# Patient Record
Sex: Male | Born: 1970 | Race: White | Hispanic: No | Marital: Married | Smoking: Former smoker
Health system: Southern US, Community
[De-identification: ages and names within clinical notes are randomized; demographics above are authoritative.]

## PROBLEM LIST (undated history)

## (undated) DIAGNOSIS — N420 Calculus of prostate: Secondary | ICD-10-CM

## (undated) DIAGNOSIS — N419 Inflammatory disease of prostate, unspecified: Secondary | ICD-10-CM

## (undated) DIAGNOSIS — E663 Overweight: Secondary | ICD-10-CM

## (undated) DIAGNOSIS — E785 Hyperlipidemia, unspecified: Secondary | ICD-10-CM

## (undated) DIAGNOSIS — Z8619 Personal history of other infectious and parasitic diseases: Secondary | ICD-10-CM

## (undated) DIAGNOSIS — F419 Anxiety disorder, unspecified: Secondary | ICD-10-CM

## (undated) DIAGNOSIS — R030 Elevated blood-pressure reading, without diagnosis of hypertension: Secondary | ICD-10-CM

## (undated) DIAGNOSIS — R339 Retention of urine, unspecified: Secondary | ICD-10-CM

## (undated) HISTORY — DX: Anxiety disorder, unspecified: F41.9

## (undated) HISTORY — DX: Retention of urine, unspecified: R33.9

## (undated) HISTORY — DX: Inflammatory disease of prostate, unspecified: N41.9

## (undated) HISTORY — DX: Overweight: E66.3

## (undated) HISTORY — DX: Personal history of other infectious and parasitic diseases: Z86.19

## (undated) HISTORY — PX: HERNIA REPAIR: SHX51

## (undated) HISTORY — DX: Hyperlipidemia, unspecified: E78.5

## (undated) HISTORY — DX: Elevated blood-pressure reading, without diagnosis of hypertension: R03.0

## (undated) HISTORY — PX: VARICOCELE EXCISION: SUR582

## (undated) HISTORY — DX: Calculus of prostate: N42.0

---

## 2004-03-23 ENCOUNTER — Emergency Department: Payer: Self-pay | Admitting: Emergency Medicine

## 2006-07-29 ENCOUNTER — Ambulatory Visit: Payer: Self-pay | Admitting: Specialist

## 2009-08-08 ENCOUNTER — Ambulatory Visit: Payer: Self-pay | Admitting: Specialist

## 2011-06-30 ENCOUNTER — Ambulatory Visit: Payer: Self-pay

## 2012-01-13 ENCOUNTER — Ambulatory Visit: Payer: Self-pay | Admitting: Family Medicine

## 2012-11-24 ENCOUNTER — Ambulatory Visit: Payer: Self-pay

## 2012-11-24 LAB — RAPID STREP-A WITH REFLX: Micro Text Report: NEGATIVE

## 2012-11-27 LAB — BETA STREP CULTURE(ARMC)

## 2013-01-24 ENCOUNTER — Ambulatory Visit: Payer: Self-pay | Admitting: Family Medicine

## 2013-01-24 LAB — CBC WITH DIFFERENTIAL/PLATELET
Basophil #: 0 10*3/uL (ref 0.0–0.1)
Basophil %: 0.8 %
Eosinophil #: 0.1 10*3/uL (ref 0.0–0.7)
Eosinophil %: 2 %
HCT: 43 % (ref 40.0–52.0)
HGB: 15.3 g/dL (ref 13.0–18.0)
Lymphocyte #: 1.6 10*3/uL (ref 1.0–3.6)
Lymphocyte %: 34.5 %
MCH: 32.9 pg (ref 26.0–34.0)
MCHC: 35.5 g/dL (ref 32.0–36.0)
MCV: 93 fL (ref 80–100)
Monocyte #: 0.3 x10 3/mm (ref 0.2–1.0)
Monocyte %: 6.5 %
Neutrophil #: 2.6 10*3/uL (ref 1.4–6.5)
Neutrophil %: 56.2 %
Platelet: 169 10*3/uL (ref 150–440)
RBC: 4.64 10*6/uL (ref 4.40–5.90)
RDW: 12.6 % (ref 11.5–14.5)
WBC: 4.6 10*3/uL (ref 3.8–10.6)

## 2013-01-24 LAB — COMPREHENSIVE METABOLIC PANEL
Albumin: 3.7 g/dL (ref 3.4–5.0)
Alkaline Phosphatase: 65 U/L (ref 50–136)
Anion Gap: 10 (ref 7–16)
BUN: 14 mg/dL (ref 7–18)
Bilirubin,Total: 0.5 mg/dL (ref 0.2–1.0)
Calcium, Total: 8.6 mg/dL (ref 8.5–10.1)
Chloride: 104 mmol/L (ref 98–107)
Co2: 26 mmol/L (ref 21–32)
Creatinine: 1.51 mg/dL — ABNORMAL HIGH (ref 0.60–1.30)
EGFR (African American): >60
EGFR (Non-African Amer.): 57 — ABNORMAL LOW
Glucose: 137 mg/dL — ABNORMAL HIGH (ref 65–99)
Osmolality: 282 (ref 275–301)
Potassium: 3.8 mmol/L (ref 3.5–5.1)
SGOT(AST): 15 U/L (ref 15–37)
SGPT (ALT): 36 U/L (ref 12–78)
Sodium: 140 mmol/L (ref 136–145)
Total Protein: 6.9 g/dL (ref 6.4–8.2)

## 2014-11-07 ENCOUNTER — Other Ambulatory Visit: Payer: Self-pay | Admitting: Family Medicine

## 2014-11-07 DIAGNOSIS — F418 Other specified anxiety disorders: Secondary | ICD-10-CM

## 2014-11-07 MED ORDER — ALPRAZOLAM 0.25 MG PO TABS: 0.2500 mg | ORAL_TABLET | Freq: Two times a day (BID) | ORAL | Status: DC | PRN

## 2014-11-14 ENCOUNTER — Other Ambulatory Visit: Payer: Self-pay

## 2014-11-14 ENCOUNTER — Other Ambulatory Visit: Payer: Self-pay | Admitting: Family Medicine

## 2014-11-14 DIAGNOSIS — F419 Anxiety disorder, unspecified: Secondary | ICD-10-CM

## 2014-11-14 MED ORDER — ALPRAZOLAM 0.25 MG PO TABS: 0.2500 mg | ORAL_TABLET | Freq: Two times a day (BID) | ORAL | Status: DC | PRN

## 2014-11-19 ENCOUNTER — Other Ambulatory Visit: Payer: Self-pay

## 2014-11-19 DIAGNOSIS — F419 Anxiety disorder, unspecified: Secondary | ICD-10-CM

## 2014-12-05 ENCOUNTER — Encounter: Payer: Self-pay | Admitting: Family Medicine

## 2014-12-05 ENCOUNTER — Ambulatory Visit (INDEPENDENT_AMBULATORY_CARE_PROVIDER_SITE_OTHER): Payer: BLUE CROSS/BLUE SHIELD | Admitting: Family Medicine

## 2014-12-05 VITALS — BP 132/88 | HR 78 | Temp 98.3°F | Resp 18 | Ht 73.0 in | Wt 249.2 lb

## 2014-12-05 DIAGNOSIS — M1000 Idiopathic gout, unspecified site: Secondary | ICD-10-CM

## 2014-12-05 DIAGNOSIS — E669 Obesity, unspecified: Secondary | ICD-10-CM

## 2014-12-05 DIAGNOSIS — F419 Anxiety disorder, unspecified: Secondary | ICD-10-CM

## 2014-12-05 MED ORDER — ALPRAZOLAM 0.25 MG PO TABS: 0.2500 mg | ORAL_TABLET | Freq: Two times a day (BID) | ORAL | Status: DC | PRN

## 2014-12-05 NOTE — Patient Instructions (Signed)
Scheduling.  Recheck anxiety in 6 months

## 2014-12-05 NOTE — Progress Notes (Signed)
Name: Gilbert Buchanan   MRN: [id number]    DOB: [birth year]   Date:12/05/2014       Progress Note  Subjective  Chief Complaint  Chief Complaint  Patient presents with  . Anxiety  . Hyperlipidemia  . Nicotine Dependence    Anxiety Presents for follow-up visit. Symptoms include excessive worry, insomnia, irritability, muscle tension and nervous/anxious behavior. Patient reports no chest pain, dizziness, nausea, palpitations or shortness of breath. Symptoms occur most days. The severity of symptoms is moderate. The symptoms are aggravated by caffeine, work stress and family issues. The quality of sleep is fair.   Past treatments include benzodiazephines and non-SSRI antidepressants. The treatment provided moderate relief. Compliance with prior treatments has been good.  Hyperlipidemia This is a chronic problem. The current episode started more than 1 year ago. The problem is controlled. Recent lipid tests were reviewed and are high. Exacerbating diseases include obesity. Factors aggravating his hyperlipidemia include fatty foods. Pertinent negatives include no chest pain, focal weakness, myalgias or shortness of breath. Treatments tried: Vascepa. The current treatment provides mild improvement of lipids. Compliance problems include adherence to diet and adherence to exercise.  Risk factors for coronary artery disease include a sedentary lifestyle, obesity, male sex, dyslipidemia and stress.  Nicotine Dependence Presents for follow-up visit. Symptoms include insomnia and irritability. Symptoms are negative for sore throat. His urge triggers include company of smokers. The symptoms have been worsening. Compliance with prior treatments has been poor.   Gout  Patient has had no recent flare of gout. He has his prescription for Naprosyn but has not had to use it any time recently.   Past Medical History  Diagnosis Date  . Hyperlipidemia   . Anxiety   . Elevated blood pressure (not hypertension)      History  Substance Use Topics  . Smoking status: Former Research scientist (life sciences)  . Smokeless tobacco: Not on file  . Alcohol Use: 0.0 oz/week    0 Standard drinks or equivalent per week     Current outpatient prescriptions:  .  ALPRAZolam (XANAX) 0.25 MG tablet, Take 1 tablet (0.25 mg total) by mouth every 12 (twelve) hours as needed for anxiety., Disp: 60 tablet, Rfl: 2 .  buPROPion (WELLBUTRIN XL) 150 MG 24 hr tablet, Take 150 mg by mouth daily., Disp: , Rfl: 5 .  tamsulosin (FLOMAX) 0.4 MG CAPS capsule, Take 1 capsule by mouth daily., Disp: , Rfl: 5 .  VASCEPA 1 G CAPS, Take 2 capsules by mouth 2 (two) times daily., Disp: , Rfl: 5 .  venlafaxine (EFFEXOR) 37.5 MG tablet, Take 1 tablet by mouth 2 (two) times daily., Disp: , Rfl: 5  No Known Allergies  Review of Systems  Constitutional: Positive for irritability. Negative for fever, chills and weight loss.  HENT: Negative for congestion, hearing loss, sore throat and tinnitus.   Eyes: Negative for blurred vision, double vision and redness.  Respiratory: Negative for cough, hemoptysis and shortness of breath.   Cardiovascular: Negative for chest pain, palpitations, orthopnea, claudication and leg swelling.  Gastrointestinal: Negative for heartburn, nausea, vomiting, diarrhea, constipation and blood in stool.  Genitourinary: Negative for dysuria, urgency, frequency and hematuria.  Musculoskeletal: Negative for myalgias, back pain, joint pain, falls and neck pain.  Skin: Negative for itching.  Neurological: Negative for dizziness, tingling, tremors, focal weakness, seizures, loss of consciousness, weakness and headaches.  Endo/Heme/Allergies: Does not bruise/bleed easily.  Psychiatric/Behavioral: Negative for depression and substance abuse. The patient is nervous/anxious and has insomnia.  Objective  Filed Vitals:   12/05/14 1154  BP: 132/88  Pulse: 78  Temp: 98.3 F (36.8 C)  Resp: 18  Height: 6\' 1"  (1.854 m)  Weight: 249 lb 4 oz  (113.059 kg)  SpO2: 97%     Physical Exam  Constitutional: He is oriented to person, place, and time and well-developed, well-nourished, and in no distress.  Obese  HENT:  Head: Normocephalic.  Bilateral nasal turbinate swelling and erythema with clear rhinorrhea  Eyes: EOM are normal. Pupils are equal, round, and reactive to light.  Neck: Normal range of motion. Neck supple. No thyromegaly present.  Cardiovascular: Normal rate, regular rhythm and normal heart sounds.   No murmur heard. Pulmonary/Chest: Effort normal and breath sounds normal. No respiratory distress. He has no wheezes.  Abdominal: Soft. Bowel sounds are normal.  Musculoskeletal: Normal range of motion. He exhibits no edema.  Lymphadenopathy:    He has no cervical adenopathy.  Neurological: He is alert and oriented to person, place, and time. No cranial nerve deficit. Gait normal. Coordination normal.  Skin: Skin is warm and dry. No rash noted.  Psychiatric: Affect and judgment normal.  Anxious      Assessment & Plan  1. Acute anxiety Controlled on current regimen - ALPRAZolam (XANAX) 0.25 MG tablet; Take 1 tablet (0.25 mg total) by mouth every 12 (twelve) hours as needed for anxiety.  Dispense: 180 tablet; Refill: 1  2. Idiopathic gout, unspecified chronicity, unspecified site No recent flare  3. Obesity Diet and exercise encouraged

## 2014-12-12 ENCOUNTER — Telehealth: Payer: Self-pay | Admitting: Family Medicine

## 2014-12-12 NOTE — Telephone Encounter (Signed)
PT HAD ALREADY CHAGED PHARM LOCATION IN SYSTEM PER LATISHA

## 2014-12-12 NOTE — Telephone Encounter (Signed)
Pt said that we need to update the pharm in his history. [facility] at [facility] . THIS IS A MAIL ORDER SERVICE. AD[address]ESS IS [address] [place] [address]

## 2014-12-21 ENCOUNTER — Other Ambulatory Visit: Payer: Self-pay

## 2014-12-21 DIAGNOSIS — F419 Anxiety disorder, unspecified: Secondary | ICD-10-CM

## 2014-12-21 MED ORDER — ALPRAZOLAM 0.25 MG PO TABS: 0.2500 mg | ORAL_TABLET | Freq: Two times a day (BID) | ORAL | Status: DC | PRN

## 2015-01-14 ENCOUNTER — Telehealth: Payer: Self-pay | Admitting: Family Medicine

## 2015-01-14 NOTE — Telephone Encounter (Signed)
Pt needs refill on Welbutrin and his cholesterol medication. [facility] is his new pharmacy.

## 2015-01-15 MED ORDER — VASCEPA 1 G PO CAPS: 2.0000 | ORAL_CAPSULE | Freq: Two times a day (BID) | ORAL | Status: DC

## 2015-01-15 MED ORDER — BUPROPION HCL ER (XL) 150 MG PO TB24: 150.0000 mg | ORAL_TABLET | Freq: Every day | ORAL | Status: DC

## 2015-01-15 NOTE — Telephone Encounter (Signed)
Sent refills to pt pharmacy.

## 2015-01-15 NOTE — Telephone Encounter (Signed)
LMOM to inform pt that refills sent to pharmacy.

## 2015-01-16 ENCOUNTER — Telehealth: Payer: Self-pay | Admitting: Family Medicine

## 2015-01-16 MED ORDER — BUPROPION HCL ER (XL) 150 MG PO TB24: 150.0000 mg | ORAL_TABLET | Freq: Every day | ORAL | Status: DC

## 2015-01-16 MED ORDER — VASCEPA 1 G PO CAPS: 2.0000 | ORAL_CAPSULE | Freq: Two times a day (BID) | ORAL | Status: DC

## 2015-01-16 NOTE — Telephone Encounter (Signed)
Changed both to 90 day supply and sent to pharmacy.

## 2015-01-16 NOTE — Telephone Encounter (Signed)
PHARM IS ASKING IF YOU COULD CALL AND OK A 90 DAY SUPPLY ON WELLBUTRIN AND VASCEPA.

## 2015-01-29 ENCOUNTER — Ambulatory Visit (INDEPENDENT_AMBULATORY_CARE_PROVIDER_SITE_OTHER): Payer: BLUE CROSS/BLUE SHIELD | Admitting: Family Medicine

## 2015-01-29 ENCOUNTER — Encounter: Payer: Self-pay | Admitting: Family Medicine

## 2015-01-29 VITALS — BP 120/70 | HR 81 | Temp 98.6°F | Resp 18 | Ht 73.0 in | Wt 256.7 lb

## 2015-01-29 DIAGNOSIS — Z0000 Encounter for general adult medical examination without abnormal findings: Secondary | ICD-10-CM | POA: Insufficient documentation

## 2015-01-29 DIAGNOSIS — G4730 Sleep apnea, unspecified: Secondary | ICD-10-CM | POA: Insufficient documentation

## 2015-01-29 NOTE — Progress Notes (Signed)
Name: Gilbert Buchanan   MRN: [id number]    DOB: March 19, 1971   Date:01/29/2015       Progress Note  Subjective  Chief Complaint  Chief Complaint  Patient presents with  . Annual Exam    HPI  44 year old presenting for annual H&P. Baseline problems have been stable.  Past Medical History  Diagnosis Date  . Hyperlipidemia   . Anxiety   . Elevated blood pressure (not hypertension)     Social History  Substance Use Topics  . Smoking status: Former Research scientist (life sciences)  . Smokeless tobacco: Not on file  . Alcohol Use: 0.0 oz/week    0 Standard drinks or equivalent per week     Current outpatient prescriptions:  .  ALPRAZolam (XANAX) 0.25 MG tablet, Take 1 tablet (0.25 mg total) by mouth every 12 (twelve) hours as needed for anxiety., Disp: 180 tablet, Rfl: 1 .  buPROPion (WELLBUTRIN XL) 150 MG 24 hr tablet, Take 1 tablet (150 mg total) by mouth daily., Disp: 90 tablet, Rfl: 1 .  tamsulosin (FLOMAX) 0.4 MG CAPS capsule, Take 1 capsule by mouth daily., Disp: , Rfl: 5 .  VASCEPA 1 G CAPS, Take 2 capsules by mouth 2 (two) times daily., Disp: 180 capsule, Rfl: 1  Allergies  Allergen Reactions  . Acyclovir And Related     Review of Systems  Constitutional: Negative for fever, chills and weight loss.  HENT: Negative for congestion, hearing loss, sore throat and tinnitus.   Eyes: Negative for blurred vision, double vision and redness.  Respiratory: Negative for cough, hemoptysis and shortness of breath.   Cardiovascular: Negative for chest pain, palpitations, orthopnea, claudication and leg swelling.  Gastrointestinal: Negative for heartburn, nausea, vomiting, diarrhea, constipation and blood in stool.  Genitourinary: Negative for dysuria, urgency, frequency and hematuria.  Musculoskeletal: Negative for myalgias, back pain, joint pain, falls and neck pain.  Skin: Negative for itching.  Neurological: Negative for dizziness, tingling, tremors, focal weakness, seizures, loss of consciousness,  weakness and headaches.  Endo/Heme/Allergies: Does not bruise/bleed easily.  Psychiatric/Behavioral: Negative for depression and substance abuse. The patient is not nervous/anxious and does not have insomnia.      Objective  Filed Vitals:   01/29/15 1511  BP: 120/70  Pulse: 81  Temp: 98.6 F (37 C)  TempSrc: Oral  Resp: 18  Height: 6\' 1"  (1.854 m)  Weight: 256 lb 11.2 oz (116.438 kg)  SpO2: 97%     Physical Exam  Constitutional: He is oriented to person, place, and time and well-developed, well-nourished, and in no distress.  Obese and in no acute distress  HENT:  Head: Normocephalic.  Eyes: EOM are normal. Pupils are equal, round, and reactive to light.  Neck: Normal range of motion. Neck supple. No thyromegaly present.  Cardiovascular: Normal rate, regular rhythm and normal heart sounds.   No murmur heard. Pulmonary/Chest: Effort normal and breath sounds normal. No respiratory distress. He has no wheezes.  Abdominal: Soft. Bowel sounds are normal.  Musculoskeletal: Normal range of motion. He exhibits no edema or tenderness.  Lymphadenopathy:    He has no cervical adenopathy.  Neurological: He is alert and oriented to person, place, and time. No cranial nerve deficit. Gait normal. Coordination normal.  Skin: Skin is warm and dry. No rash noted.  Psychiatric: Affect and judgment normal.      Assessment & Plan  1. Annual physical exam  - CBC with Differential/Platelet - Comprehensive metabolic panel - Lipid panel - POC Hemoccult Bld/Stl (3-Cd Home Screen); Future -  TSH

## 2015-01-29 NOTE — Patient Instructions (Signed)
Sleep Apnea  Sleep apnea is a sleep disorder characterized by abnormal pauses in breathing while you sleep. When your breathing pauses, the level of oxygen in your blood decreases. This causes you to move out of deep sleep and into light sleep. As a result, your quality of sleep is poor, and the system that carries your blood throughout your body (cardiovascular system) experiences stress. If sleep apnea remains untreated, the following conditions can develop:  High blood pressure (hypertension).  Coronary artery disease.  Inability to achieve or maintain an erection (impotence).  Impairment of your thought process (cognitive dysfunction). There are three types of sleep apnea: 1. Obstructive sleep apnea--Pauses in breathing during sleep because of a blocked airway. 2. Central sleep apnea--Pauses in breathing during sleep because the area of the brain that controls your breathing does not send the correct signals to the muscles that control breathing. 3. Mixed sleep apnea--A combination of both obstructive and central sleep apnea. RISK FACTORS The following risk factors can increase your risk of developing sleep apnea:  Being overweight.  Smoking.  Having narrow passages in your nose and throat.  Being of older age.  Being male.  Alcohol use.  Sedative and tranquilizer use.  Ethnicity. Among individuals younger than 35 years, African Americans are at increased risk of sleep apnea. SYMPTOMS   Difficulty staying asleep.  Daytime sleepiness and fatigue.  Loss of energy.  Irritability.  Loud, heavy snoring.  Morning headaches.  Trouble concentrating.  Forgetfulness.  Decreased interest in sex. DIAGNOSIS  In order to diagnose sleep apnea, your caregiver will perform a physical examination. Your caregiver may suggest that you take a home sleep test. Your caregiver may also recommend that you spend the night in a sleep lab. In the sleep lab, several monitors record  information about your heart, lungs, and brain while you sleep. Your leg and arm movements and blood oxygen level are also recorded. TREATMENT The following actions may help to resolve mild sleep apnea:  Sleeping on your side.   Using a decongestant if you have nasal congestion.   Avoiding the use of depressants, including alcohol, sedatives, and narcotics.   Losing weight and modifying your diet if you are overweight. There also are devices and treatments to help open your airway:  Oral appliances. These are custom-made mouthpieces that shift your lower jaw forward and slightly open your bite. This opens your airway.  Devices that create positive airway pressure. This positive pressure "splints" your airway open to help you breathe better during sleep. The following devices create positive airway pressure:  Continuous positive airway pressure (CPAP) device. The CPAP device creates a continuous level of air pressure with an air pump. The air is delivered to your airway through a mask while you sleep. This continuous pressure keeps your airway open.  Nasal expiratory positive airway pressure (EPAP) device. The EPAP device creates positive air pressure as you exhale. The device consists of single-use valves, which are inserted into each nostril and held in place by adhesive. The valves create very little resistance when you inhale but create much more resistance when you exhale. That increased resistance creates the positive airway pressure. This positive pressure while you exhale keeps your airway open, making it easier to breath when you inhale again.  Bilevel positive airway pressure (BPAP) device. The BPAP device is used mainly in patients with central sleep apnea. This device is similar to the CPAP device because it also uses an air pump to deliver continuous air pressure   through a mask. However, with the BPAP machine, the pressure is set at two different levels. The pressure when you  exhale is lower than the pressure when you inhale.  Surgery. Typically, surgery is only done if you cannot comply with less invasive treatments or if the less invasive treatments do not improve your condition. Surgery involves removing excess tissue in your airway to create a wider passage way. Document Released: 05/15/2002 Document Revised: 09/19/2012 Document Reviewed: 10/01/2011 [facility] Patient Information 2015 [place], [place]. This information is not intended to replace advice given to you by your health care provider. Make sure you discuss any questions you have with your health care provider. Obesity Obesity is defined as having too much total body fat and a body mass index (BMI) of 30 or more. BMI is an estimate of body fat and is calculated from your height and weight. Obesity happens when you consume more calories than you can burn by exercising or performing daily physical tasks. Prolonged obesity can cause major illnesses or emergencies, such as:   Stroke.  Heart disease.  Diabetes.  Cancer.  Arthritis.  High blood pressure (hypertension).  High cholesterol.  Sleep apnea.  Erectile dysfunction.  Infertility problems. CAUSES  4. Regularly eating unhealthy foods. 5. Physical inactivity. 6. Certain disorders, such as an underactive thyroid (hypothyroidism), Cushing's syndrome, and polycystic ovarian syndrome. 7. Certain medicines, such as steroids, some depression medicines, and antipsychotics. 8. Genetics. 9. Lack of sleep. DIAGNOSIS  A health care provider can diagnose obesity after calculating your BMI. Obesity will be diagnosed if your BMI is 30 or higher.  There are other methods of measuring obesity levels. Some other methods include measuring your skinfold thickness, your waist circumference, and comparing your hip circumference to your waist circumference. TREATMENT  A healthy treatment program includes some or all of the following:  Long-term dietary  changes.  Exercise and physical activity.  Behavioral and lifestyle changes.  Medicine only under the supervision of your health care provider. Medicines may help, but only if they are used with diet and exercise programs. An unhealthy treatment program includes:  Fasting.  Fad diets.  Supplements and drugs. These choices do not succeed in long-term weight control.  HOME CARE INSTRUCTIONS   Exercise and perform physical activity as directed by your health care provider. To increase physical activity, try the following:  Use stairs instead of elevators.  Park farther away from store entrances.  Garden, bike, or walk instead of watching television or using the computer.  Eat healthy, low-calorie foods and drinks on a regular basis. Eat more fruits and vegetables. Use low-calorie cookbooks or take healthy cooking classes.  Limit fast food, sweets, and processed snack foods.  Eat smaller portions.  Keep a daily journal of everything you eat. There are many free websites to help you with this. It may be helpful to measure your foods so you can determine if you are eating the correct portion sizes.  Avoid drinking alcohol. Drink more water and drinks without calories.  Take vitamins and supplements only as recommended by your health care provider.  Weight-loss support groups, Tax adviser, counselors, and stress reduction education can also be very helpful. SEEK IMMEDIATE MEDICAL CARE IF:  You have chest pain or tightness.  You have trouble breathing or feel short of breath.  You have weakness or leg numbness.  You feel confused or have trouble talking.  You have sudden changes in your vision. MAKE SURE YOU:  Understand these instructions.  Will watch  your condition.  Will get help right away if you are not doing well or get worse. Document Released: 07/02/2004 Document Revised: 10/09/2013 Document Reviewed: 07/01/2011 [facility] Patient Information 2015  [place], [place]. This information is not intended to replace advice given to you by your health care provider. Make sure you discuss any questions you have with your health care provider.

## 2015-03-26 ENCOUNTER — Telehealth: Payer: Self-pay | Admitting: Family Medicine

## 2015-03-26 NOTE — Telephone Encounter (Signed)
Yes pt needs appointment for any health screening forms

## 2015-03-26 NOTE — Telephone Encounter (Signed)
We will remind the patient again when he comes in.

## 2015-03-26 NOTE — Telephone Encounter (Signed)
Per doctor Rutherford Nail pt must make appointment for health screening. He requests this of all patients with this paperwork.

## 2015-03-26 NOTE — Telephone Encounter (Signed)
Pt would like to pick up his lab order in the morning to go get his labs done. He received a lab order over a month ago for his CPE and never got it done. Also he has a health screening form for his insurance that needs to be filled out. Does he need an appt for this?

## 2015-03-26 NOTE — Telephone Encounter (Signed)
I called pt to inform that he needs to schedule an appt to get paperwork filled out, pt states he is not scheduling an appt and pay again since he had a CPE not to long ago. Pt states he will drop the form off for Dr Rutherford Nail to fill out.

## 2015-03-30 LAB — COMPREHENSIVE METABOLIC PANEL
ALT: 42 IU/L (ref 0–44)
AST: 23 IU/L (ref 0–40)
Albumin/Globulin Ratio: 2.3 (ref 1.1–2.5)
Albumin: 4.6 g/dL (ref 3.5–5.5)
Alkaline Phosphatase: 54 IU/L (ref 39–117)
BUN/Creatinine Ratio: 14 (ref 9–20)
BUN: 15 mg/dL (ref 6–24)
Bilirubin Total: 0.4 mg/dL (ref 0.0–1.2)
CO2: 24 mmol/L (ref 18–29)
Calcium: 9.5 mg/dL (ref 8.7–10.2)
Chloride: 101 mmol/L (ref 97–106)
Creatinine, Ser: 1.09 mg/dL (ref 0.76–1.27)
GFR calc Af Amer: 96 mL/min/{1.73_m2} (ref >59)
GFR calc non Af Amer: 83 mL/min/{1.73_m2} (ref >59)
Globulin, Total: 2 g/dL (ref 1.5–4.5)
Glucose: 110 mg/dL — ABNORMAL HIGH (ref 65–99)
Potassium: 4.4 mmol/L (ref 3.5–5.2)
Sodium: 143 mmol/L (ref 136–144)
Total Protein: 6.6 g/dL (ref 6.0–8.5)

## 2015-03-30 LAB — CBC WITH DIFFERENTIAL/PLATELET
Basophils Absolute: 0 10*3/uL (ref 0.0–0.2)
Basos: 0 %
EOS (ABSOLUTE): 0.1 10*3/uL (ref 0.0–0.4)
Eos: 2 %
Hematocrit: 46.9 % (ref 37.5–51.0)
Hemoglobin: 16.3 g/dL (ref 12.6–17.7)
Immature Grans (Abs): 0 10*3/uL (ref 0.0–0.1)
Immature Granulocytes: 0 %
Lymphocytes Absolute: 1.7 10*3/uL (ref 0.7–3.1)
Lymphs: 31 %
MCH: 32.7 pg (ref 26.6–33.0)
MCHC: 34.8 g/dL (ref 31.5–35.7)
MCV: 94 fL (ref 79–97)
Monocytes Absolute: 0.4 10*3/uL (ref 0.1–0.9)
Monocytes: 8 %
Neutrophils Absolute: 3.2 10*3/uL (ref 1.4–7.0)
Neutrophils: 59 %
Platelets: 211 10*3/uL (ref 150–379)
RBC: 4.99 x10E6/uL (ref 4.14–5.80)
RDW: 12.6 % (ref 12.3–15.4)
WBC: 5.5 10*3/uL (ref 3.4–10.8)

## 2015-03-30 LAB — TSH: TSH: 1.04 u[IU]/mL (ref 0.450–4.500)

## 2015-03-30 LAB — LIPID PANEL
Chol/HDL Ratio: 8.1 ratio units — ABNORMAL HIGH (ref 0.0–5.0)
Cholesterol, Total: 203 mg/dL — ABNORMAL HIGH (ref 100–199)
HDL: 25 mg/dL — ABNORMAL LOW (ref >39)
Triglycerides: 618 mg/dL (ref 0–149)

## 2015-04-08 ENCOUNTER — Telehealth: Payer: Self-pay | Admitting: Emergency Medicine

## 2015-04-08 NOTE — Telephone Encounter (Signed)
Patient notified

## 2015-04-24 ENCOUNTER — Telehealth: Payer: Self-pay | Admitting: Urology

## 2015-04-24 DIAGNOSIS — N400 Enlarged prostate without lower urinary tract symptoms: Secondary | ICD-10-CM

## 2015-04-24 NOTE — Telephone Encounter (Signed)
Pt's is out of Tamsilosin and needs refill called in.  Has an appt next week on 11/22 but needs a refill called in today.  Please call in to [place] at [phone].  Please call patient if any questions at [phone].

## 2015-04-25 ENCOUNTER — Encounter: Payer: Self-pay | Admitting: *Deleted

## 2015-04-25 MED ORDER — TAMSULOSIN HCL 0.4 MG PO CAPS: 0.4000 mg | ORAL_CAPSULE | Freq: Every day | ORAL | Status: DC

## 2015-04-25 NOTE — Telephone Encounter (Signed)
Medication called into pt pharmacy  

## 2015-04-30 ENCOUNTER — Ambulatory Visit (INDEPENDENT_AMBULATORY_CARE_PROVIDER_SITE_OTHER): Payer: BLUE CROSS/BLUE SHIELD | Admitting: Urology

## 2015-04-30 ENCOUNTER — Encounter: Payer: Self-pay | Admitting: Family Medicine

## 2015-04-30 ENCOUNTER — Ambulatory Visit (INDEPENDENT_AMBULATORY_CARE_PROVIDER_SITE_OTHER): Payer: BLUE CROSS/BLUE SHIELD | Admitting: Family Medicine

## 2015-04-30 ENCOUNTER — Encounter: Payer: Self-pay | Admitting: Urology

## 2015-04-30 VITALS — BP 106/69 | HR 67 | Ht 74.0 in | Wt 243.6 lb

## 2015-04-30 VITALS — BP 120/70 | HR 87 | Temp 98.1°F | Resp 18 | Ht 73.0 in | Wt 241.0 lb

## 2015-04-30 DIAGNOSIS — N419 Inflammatory disease of prostate, unspecified: Secondary | ICD-10-CM

## 2015-04-30 DIAGNOSIS — E785 Hyperlipidemia, unspecified: Secondary | ICD-10-CM | POA: Diagnosis not present

## 2015-04-30 DIAGNOSIS — N401 Enlarged prostate with lower urinary tract symptoms: Secondary | ICD-10-CM | POA: Diagnosis not present

## 2015-04-30 DIAGNOSIS — F411 Generalized anxiety disorder: Secondary | ICD-10-CM | POA: Diagnosis not present

## 2015-04-30 DIAGNOSIS — F419 Anxiety disorder, unspecified: Secondary | ICD-10-CM

## 2015-04-30 DIAGNOSIS — N138 Other obstructive and reflux uropathy: Secondary | ICD-10-CM

## 2015-04-30 MED ORDER — ALPRAZOLAM 0.25 MG PO TABS: 0.2500 mg | ORAL_TABLET | Freq: Two times a day (BID) | ORAL | Status: DC | PRN

## 2015-04-30 MED ORDER — TAMSULOSIN HCL 0.4 MG PO CAPS: 0.4000 mg | ORAL_CAPSULE | Freq: Every day | ORAL | Status: DC

## 2015-04-30 NOTE — Progress Notes (Signed)
04/30/2015 7:45 PM   Gilbert Buchanan [birth year] [id number]  Referring provider: Ashok Norris, MD [address] [place], [place] [address]  Chief Complaint  Patient presents with  . Prostatitis    one year recheck    HPI: Patient is a 44 year old white male with a history of prostatitis and BPH with LUTS who presents today for yearly follow-up.   History of prostatitis Patient has not had an episode of prostatitis since starting his tamsulosin 0.4 mg one year ago.  BPH WITH LUTS His IPSS score today is 3, which is mild lower urinary tract symptomatology. He is pleased with his quality life due to his urinary symptoms.  He denies any dysuria, hematuria or suprapubic pain.  He currently taking tamsulosin 0.4 mg daily.  He also denies any recent fevers, chills, nausea or vomiting.  He does not have a family history of PCa.      IPSS      04/30/15 1400       International Prostate Symptom Score   How often have you had the sensation of not emptying your bladder? Not at All     How often have you had to urinate less than every two hours? About half the time     How often have you found you stopped and started again several times when you urinated? Not at All     How often have you found it difficult to postpone urination? Not at All     How often have you had a weak urinary stream? Not at All     How often have you had to strain to start urination? Not at All     How many times did you typically get up at night to urinate? None     Total IPSS Score 3     Quality of Life due to urinary symptoms   If you were to spend the rest of your life with your urinary condition just the way it is now how would you feel about that? Pleased        Score:  1-7 Mild 8-19 Moderate 20-35 Severe     PMH: Past Medical History  Diagnosis Date  . Hyperlipidemia   . Anxiety   . Elevated blood pressure (not hypertension)   . History of Lyme disease   . Incomplete bladder  emptying   . Prostatitis   . Over weight   . Stones, prostate     Surgical History: Past Surgical History  Procedure Laterality Date  . Hernia repair    . Varicocele excision      Home Medications:    Medication List       This list is accurate as of: 04/30/15 11:59 PM.  Always use your most recent med list.               ALPRAZolam 0.25 MG tablet  Commonly known as:  XANAX  Take 1 tablet (0.25 mg total) by mouth every 12 (twelve) hours as needed for anxiety.     buPROPion 150 MG 24 hr tablet  Commonly known as:  WELLBUTRIN XL  Take 1 tablet (150 mg total) by mouth daily.     tamsulosin 0.4 MG Caps capsule  Commonly known as:  FLOMAX  Take 1 capsule (0.4 mg total) by mouth daily.     VASCEPA 1 G Caps  Generic drug:  Icosapent Ethyl  Take 2 capsules by mouth 2 (two) times daily.  Allergies:  Allergies  Allergen Reactions  . Acyclovir And Related     Family History: Family History  Problem Relation Age of Onset  . Testicular cancer Father   . Polycystic kidney disease    . Kidney disease Mother   . Prostate cancer Neg Hx     Social History:  reports that he has quit smoking. His smoking use included Cigars. He does not have any smokeless tobacco history on file. He reports that he drinks alcohol. He reports that he does not use illicit drugs.  ROS: UROLOGY Frequent Urination?: No Hard to postpone urination?: No Burning/pain with urination?: No Get up at night to urinate?: No Leakage of urine?: No Urine stream starts and stops?: No Trouble starting stream?: No Do you have to strain to urinate?: No Blood in urine?: No Urinary tract infection?: No Sexually transmitted disease?: No Injury to kidneys or bladder?: No Painful intercourse?: No Weak stream?: No Erection problems?: No Penile pain?: No  Gastrointestinal Nausea?: No Vomiting?: No Indigestion/heartburn?: No Diarrhea?: No Constipation?: No  Constitutional Fever: No Night  sweats?: No Weight loss?: No Fatigue?: No  Skin Skin rash/lesions?: No Itching?: No  Eyes Blurred vision?: No Double vision?: No  Ears/Nose/Throat Sore throat?: No Sinus problems?: No  Hematologic/Lymphatic Swollen glands?: No Easy bruising?: No  Cardiovascular Leg swelling?: No Chest pain?: No  Respiratory Cough?: No Shortness of breath?: No  Endocrine Excessive thirst?: No  Musculoskeletal Back pain?: No Joint pain?: No  Neurological Headaches?: No Dizziness?: No  Psychologic Depression?: No Anxiety?: No  Physical Exam: BP 106/69 mmHg  Pulse 67  Ht 6\' 2"  (1.88 m)  Wt 243 lb 9.6 oz (110.496 kg)  BMI 31.26 kg/m2  GU: No CVA tenderness.  No bladder fullness or masses.  Patient with circumcised phallus.  Urethral meatus is patent.  No penile discharge. No penile lesions or rashes. Scrotum without lesions, cysts, rashes and/or edema.  Testicles are located scrotally bilaterally. No masses are appreciated in the testicles. Left and right epididymis are normal. Rectal: Patient with  normal sphincter tone. Anus and perineum without scarring or rashes. No rectal masses are appreciated. Prostate is approximately 50 grams, no nodules are appreciated. Seminal vesicles are normal.   Laboratory Data: Lab Results  Component Value Date   WBC 5.5 03/29/2015   HGB 15.3 01/24/2013   HCT 46.9 03/29/2015   MCV 93 01/24/2013   PLT 169 01/24/2013    Lab Results  Component Value Date   CREATININE 1.09 03/29/2015   PSA History  1.0 ng/mL on 08/25/2011  1.1 ng/mL on 08/09/2013  Assessment & Plan:    1. Prostatitis, unspecified prostatitis type:   Patient has not had any episodes since he started tamsulosin 0.4 mg daily. He will continue that medication.  Refills are sent to his pharmacy.  2. BPH with LUTS:   IPSS score is 3/1.  He will continue the tamsulosin 0.4 mg daily.  We will continue to monitor.  He will return to the clinic in one year for IPSS score, exam  and PSA.    - PSA   Return in about 1 year (around 04/29/2016) for IPSS and exam.  Zara Council, [facility]  [facility] [address], [place], [place] [address] [phone]

## 2015-04-30 NOTE — Progress Notes (Signed)
Name: Gilbert Buchanan   MRN: [id number]    DOB: [birth year]   Date:04/30/2015       Progress Note  Subjective  Chief Complaint  Chief Complaint  Patient presents with  . Hyperlipidemia    3 month recheck  . Anxiety    HPI  Hyperlipidemia  Patient has a history of hyperlipidemia for several years.  Current medical regimen consist of placebo 1 g daily .  Compliance is certainly needs a lipid good .  Diet and exercise are currently followed intermittently .  Risk factors for cardiovascular disease include hyperlipidemia relatively sedentary lifestyle .   There have been no side effects from the medication.    Chronic anxiety history of present illness  Patient has a long-standing history of chronic anxiety for which she currently takes Xanax 0.25 mg twice a day and Wellbutrin 150 mg daily. He describes his symptomatology is consistent racing heart racing thoughts insomnia sweaty palms. There is no concomitant drug abuse though he does consume alcohol on a when necessary basis. This regimen has been working fine for him.  Past Medical History  Diagnosis Date  . Hyperlipidemia   . Anxiety   . Elevated blood pressure (not hypertension)   . History of Lyme disease   . Incomplete bladder emptying   . Prostatitis   . Over weight   . Stones, prostate     Social History  Substance Use Topics  . Smoking status: Former Research scientist (life sciences)  . Smokeless tobacco: Not on file  . Alcohol Use: 0.0 oz/week    0 Standard drinks or equivalent per week     Comment: occ     Current outpatient prescriptions:  .  ALPRAZolam (XANAX) 0.25 MG tablet, Take 1 tablet (0.25 mg total) by mouth every 12 (twelve) hours as needed for anxiety., Disp: 180 tablet, Rfl: 1 .  buPROPion (WELLBUTRIN XL) 150 MG 24 hr tablet, Take 1 tablet (150 mg total) by mouth daily., Disp: 90 tablet, Rfl: 1 .  tamsulosin (FLOMAX) 0.4 MG CAPS capsule, Take 1 capsule (0.4 mg total) by mouth daily., Disp: 30 capsule, Rfl: 5 .  VASCEPA 1 G  CAPS, Take 2 capsules by mouth 2 (two) times daily., Disp: 180 capsule, Rfl: 1  Allergies  Allergen Reactions  . Acyclovir And Related     Review of Systems  Constitutional: Negative for fever, chills and weight loss.  HENT: Negative for congestion, hearing loss, sore throat and tinnitus.   Eyes: Negative for blurred vision, double vision and redness.  Respiratory: Negative for cough, hemoptysis and shortness of breath.   Cardiovascular: Positive for palpitations. Negative for chest pain, orthopnea, claudication and leg swelling.  Gastrointestinal: Negative for heartburn, nausea, vomiting, diarrhea, constipation and blood in stool.  Genitourinary: Negative for dysuria, urgency, frequency and hematuria.  Musculoskeletal: Positive for joint pain. Negative for myalgias, back pain, falls and neck pain.  Skin: Negative for itching.  Neurological: Negative for dizziness, tingling, tremors, focal weakness, seizures, loss of consciousness, weakness and headaches.  Endo/Heme/Allergies: Does not bruise/bleed easily.  Psychiatric/Behavioral: Negative for depression and substance abuse. The patient is nervous/anxious. The patient does not have insomnia.      Objective  Filed Vitals:   04/30/15 1159  BP: 120/70  Pulse: 87  Temp: 98.1 F (36.7 C)  TempSrc: Oral  Resp: 18  Height: 6\' 1"  (1.854 m)  Weight: 241 lb (109.317 kg)  SpO2: 98%     Physical Exam  Constitutional: He is oriented to person, place, and  time.  Obese male in no acute distress  HENT:  Head: Normocephalic.  Eyes: EOM are normal. Pupils are equal, round, and reactive to light.  Neck: Normal range of motion. Neck supple. No thyromegaly present.  Cardiovascular: Normal rate, regular rhythm and normal heart sounds.   No murmur heard. Pulmonary/Chest: Effort normal and breath sounds normal. No respiratory distress. He has no wheezes.  Abdominal: Soft. Bowel sounds are normal.  Musculoskeletal: Normal range of motion. He  exhibits no edema.  Lymphadenopathy:    He has no cervical adenopathy.  Neurological: He is alert and oriented to person, place, and time. No cranial nerve deficit. Gait normal. Coordination normal.  Skin: Skin is warm and dry. No rash noted.  Psychiatric: Judgment normal.  Anxious and quite loquacious      Assessment & Plan   1. Hyperlipidemia  - Amb ref to Medical Nutrition Therapy-MNT  2. Generalized anxiety disorder Continue current regimen - ALPRAZolam (XANAX) 0.25 MG tablet; Take 1 tablet (0.25 mg total) by mouth every 12 (twelve) hours as needed for anxiety.  Dispense: 180 tablet; Refill: 0

## 2015-05-01 LAB — PSA: Prostate Specific Ag, Serum: 0.8 ng/mL (ref 0.0–4.0)

## 2015-05-05 DIAGNOSIS — N401 Enlarged prostate with lower urinary tract symptoms: Secondary | ICD-10-CM

## 2015-05-05 DIAGNOSIS — N419 Inflammatory disease of prostate, unspecified: Secondary | ICD-10-CM | POA: Insufficient documentation

## 2015-05-05 DIAGNOSIS — N138 Other obstructive and reflux uropathy: Secondary | ICD-10-CM | POA: Insufficient documentation

## 2015-05-05 HISTORY — DX: Other obstructive and reflux uropathy: N13.8

## 2015-05-06 ENCOUNTER — Telehealth: Payer: Self-pay

## 2015-05-06 DIAGNOSIS — R972 Elevated prostate specific antigen [PSA]: Secondary | ICD-10-CM

## 2015-05-06 NOTE — Telephone Encounter (Signed)
-----   Message from Nori Riis, PA-C sent at 05/01/2015  9:10 AM EST ----- Patient's PSA is stable.  We will see him in 12 months.  PSA to be drawn before his next appointment.

## 2015-05-06 NOTE — Telephone Encounter (Signed)
Spoke with pt in reference to PSA results. Pt voiced understanding. Orders placed for 47yr.

## 2015-05-24 ENCOUNTER — Telehealth: Payer: Self-pay | Admitting: Family Medicine

## 2015-05-24 DIAGNOSIS — F419 Anxiety disorder, unspecified: Secondary | ICD-10-CM

## 2015-05-24 MED ORDER — VASCEPA 1 G PO CAPS: 2.0000 | ORAL_CAPSULE | Freq: Two times a day (BID) | ORAL | Status: DC

## 2015-05-24 MED ORDER — ALPRAZOLAM 0.25 MG PO TABS: 0.2500 mg | ORAL_TABLET | Freq: Two times a day (BID) | ORAL | Status: DC | PRN

## 2015-05-24 NOTE — Telephone Encounter (Signed)
Wellbutrin was refilled on 01/16/15 for 90 day supply with 1 refill so it is not due for refill yet and printed and faxed xanax and sent vascepa. Please inform pt

## 2015-05-24 NOTE — Telephone Encounter (Signed)
Requesting refills on vascepa, wellbutrin and alprazolam. Please send to [id number]. Please return call once complete.

## 2015-05-24 NOTE — Telephone Encounter (Signed)
Left message to inform patient.

## 2015-06-06 ENCOUNTER — Ambulatory Visit: Payer: BLUE CROSS/BLUE SHIELD | Admitting: Family Medicine

## 2015-06-12 ENCOUNTER — Ambulatory Visit: Payer: Self-pay | Admitting: Dietician

## 2015-06-26 ENCOUNTER — Other Ambulatory Visit: Payer: Self-pay

## 2015-06-28 ENCOUNTER — Other Ambulatory Visit: Payer: Self-pay | Admitting: Family Medicine

## 2015-06-28 MED ORDER — VASCEPA 1 G PO CAPS: 2.0000 | ORAL_CAPSULE | Freq: Two times a day (BID) | ORAL | Status: DC

## 2015-06-28 NOTE — Telephone Encounter (Signed)
[facility] called to transfer script to [facility] [phone] for [place]. Script sent

## 2015-07-03 ENCOUNTER — Other Ambulatory Visit: Payer: Self-pay | Admitting: Family Medicine

## 2015-07-03 ENCOUNTER — Encounter: Payer: Self-pay | Admitting: Family Medicine

## 2015-07-03 ENCOUNTER — Ambulatory Visit (INDEPENDENT_AMBULATORY_CARE_PROVIDER_SITE_OTHER): Payer: BLUE CROSS/BLUE SHIELD | Admitting: Family Medicine

## 2015-07-03 ENCOUNTER — Ambulatory Visit
Admission: RE | Admit: 2015-07-03 | Discharge: 2015-07-03 | Disposition: A | Discharge status: Home / Self Care | Payer: BLUE CROSS/BLUE SHIELD | Source: Ambulatory Visit | Attending: Family Medicine | Admitting: Family Medicine

## 2015-07-03 VITALS — BP 122/66 | HR 99 | Temp 98.2°F | Resp 16 | Ht 73.0 in | Wt 214.1 lb

## 2015-07-03 DIAGNOSIS — K5901 Slow transit constipation: Secondary | ICD-10-CM | POA: Diagnosis present

## 2015-07-03 MED ORDER — LINACLOTIDE 145 MCG PO CAPS: 145.0000 ug | ORAL_CAPSULE | Freq: Every day | ORAL | Status: DC

## 2015-07-03 MED ORDER — LUBIPROSTONE 8 MCG PO CAPS: 8.0000 ug | ORAL_CAPSULE | Freq: Every day | ORAL | Status: DC

## 2015-07-03 NOTE — Progress Notes (Signed)
Name: Gilbert Buchanan   MRN: [id number]    DOB: 1970-11-26   Date:07/03/2015       Progress Note  Subjective  Chief Complaint  Chief Complaint  Patient presents with  . Constipation    HPI  Gilbert Buchanan is a 45 year old male here today to discuss his constipation. He has intentionally been working hard to lose weight with diet and exercise. He states he feels great, good energy and mood. He has never been an every day BM person but more recently he notes less frequent BMs. When he does have a BM they are well formed, no pain, no blood. Denies symptoms such as abdominal pain or having to strain. No family history of colon cancer.   Past Medical History  Diagnosis Date  . Hyperlipidemia   . Anxiety   . Elevated blood pressure (not hypertension)   . History of Lyme disease   . Incomplete bladder emptying   . Prostatitis   . Over weight   . Stones, prostate     Patient Active Problem List   Diagnosis Date Noted  . Prostatitis 05/05/2015  . BPH with obstruction/lower urinary tract symptoms 05/05/2015  . Annual physical exam 01/29/2015  . Sleep apnea 01/29/2015    Social History  Substance Use Topics  . Smoking status: Former Smoker    Types: Cigars  . Smokeless tobacco: Not on file     Comment: occasionally  . Alcohol Use: 0.0 oz/week    0 Standard drinks or equivalent per week     Comment: occ     Current outpatient prescriptions:  .  ALPRAZolam (XANAX) 0.25 MG tablet, Take 1 tablet (0.25 mg total) by mouth every 12 (twelve) hours as needed for anxiety., Disp: 180 tablet, Rfl: 0 .  buPROPion (WELLBUTRIN XL) 150 MG 24 hr tablet, Take 1 tablet (150 mg total) by mouth daily., Disp: 90 tablet, Rfl: 1 .  tamsulosin (FLOMAX) 0.4 MG CAPS capsule, Take 1 capsule (0.4 mg total) by mouth daily., Disp: 90 capsule, Rfl: 3 .  VASCEPA 1 g CAPS, Take 2 capsules by mouth 2 (two) times daily., Disp: 180 capsule, Rfl: 0  Past Surgical History  Procedure Laterality Date  . Hernia  repair    . Varicocele excision      Family History  Problem Relation Age of Onset  . Testicular cancer Father   . Polycystic kidney disease    . Kidney disease Mother   . Prostate cancer Neg Hx     Allergies  Allergen Reactions  . Acyclovir And Related      Review of Systems  CONSTITUTIONAL: No significant weight changes, fever, chills, weakness or fatigue.  CARDIOVASCULAR: No chest pain, chest pressure or chest discomfort. No palpitations or edema.  RESPIRATORY: No shortness of breath, cough or sputum.  GASTROINTESTINAL: No anorexia, nausea, vomiting. Chronic constipation. No abdominal pain or blood.  NEUROLOGICAL: No headache, dizziness, syncope, paralysis, ataxia, numbness or tingling in the extremities. No memory changes. No change in bowel or bladder control.  MUSCULOSKELETAL: No joint pain. No muscle pain. HEMATOLOGIC: No anemia, bleeding or bruising.  LYMPHATICS: No enlarged lymph nodes.  PSYCHIATRIC: No change in mood. No change in sleep pattern.  ENDOCRINOLOGIC: No reports of sweating, cold or heat intolerance. No polyuria or polydipsia.     Objective  BP 122/66 mmHg  Pulse 99  Temp(Src) 98.2 F (36.8 C) (Oral)  Resp 16  Ht 6\' 1"  (1.854 m)  Wt 214 lb 1.6 oz (97.115  kg)  BMI 28.25 kg/m2  SpO2 98% Body mass index is 28.25 kg/(m^2).  Physical Exam  Constitutional: Patient appears well-developed and well-nourished. In no distress.  Cardiovascular: Normal rate, regular rhythm and normal heart sounds.  No murmur heard.  Pulmonary/Chest: Effort normal and breath sounds normal. No respiratory distress. Abdomen: Soft, NT/ND, Normal BS, No HSM Musculoskeletal: Normal range of motion bilateral UE and LE, no joint effusions. Peripheral vascular: Bilateral LE no edema. Neurological: CN II-XII grossly intact with no focal deficits. Alert and oriented to person, place, and time. Coordination, balance, strength, speech and gait are normal.  Skin: Skin is warm and dry.  No rash noted. No erythema.  Psychiatric: Patient has a stable anxious mood and affect. Behavior is normal in office today. Judgment and thought content normal in office today.   Assessment & Plan  1. Slow transit constipation Maurice has intentionally lost weight with diet, exercise and lifestyle changes. He is still having difficulty with regular BMs despite use of Metamucil, Miralax, Docusate. Trial of low dose amitiza 8 mcg one a day. Will get abdominal x-ray to determine degree of stool burden. We did discuss colon cancer and if symptoms persist I will refer him for a early C-scope.   - DG Abd 2 Views; Future - lubiprostone (AMITIZA) 8 MCG capsule; Take 1 capsule (8 mcg total) by mouth daily with breakfast.  Dispense: 30 capsule; Refill: 3

## 2015-07-03 NOTE — Patient Instructions (Signed)

## 2015-07-05 ENCOUNTER — Ambulatory Visit: Payer: Self-pay | Admitting: Dietician

## 2015-07-26 ENCOUNTER — Other Ambulatory Visit: Payer: Self-pay

## 2015-07-26 MED ORDER — BUPROPION HCL ER (XL) 150 MG PO TB24: 150.0000 mg | ORAL_TABLET | Freq: Every day | ORAL | Status: DC

## 2015-07-31 ENCOUNTER — Ambulatory Visit: Payer: BLUE CROSS/BLUE SHIELD | Admitting: Family Medicine

## 2015-08-13 ENCOUNTER — Ambulatory Visit: Payer: BLUE CROSS/BLUE SHIELD | Admitting: Family Medicine

## 2015-08-26 ENCOUNTER — Other Ambulatory Visit: Payer: Self-pay

## 2015-08-26 DIAGNOSIS — F419 Anxiety disorder, unspecified: Secondary | ICD-10-CM

## 2015-08-26 MED ORDER — ALPRAZOLAM 0.25 MG PO TABS: 0.2500 mg | ORAL_TABLET | Freq: Two times a day (BID) | ORAL | Status: DC | PRN

## 2015-09-11 ENCOUNTER — Telehealth: Payer: Self-pay | Admitting: Family Medicine

## 2015-09-11 NOTE — Telephone Encounter (Signed)
I cancelled patient appointment for 09-19-15. He is requesting that his Alprazolam be faxed to [facility], stated that he is not allowed to use a regular pharmacy.

## 2015-09-12 MED ORDER — BUPROPION HCL ER (XL) 150 MG PO TB24: 150.0000 mg | ORAL_TABLET | Freq: Every day | ORAL | Status: DC

## 2015-09-12 NOTE — Telephone Encounter (Signed)
Medication has been faxed to his pharmacy.

## 2015-09-19 ENCOUNTER — Ambulatory Visit: Payer: BLUE CROSS/BLUE SHIELD | Admitting: Family Medicine

## 2015-10-29 ENCOUNTER — Other Ambulatory Visit: Payer: Self-pay | Admitting: Family Medicine

## 2015-10-29 MED ORDER — VASCEPA 1 G PO CAPS: 2.0000 | ORAL_CAPSULE | Freq: Two times a day (BID) | ORAL | Status: DC

## 2015-10-29 NOTE — Telephone Encounter (Signed)
Refill request was sent to Dr. Krichna Sowles for approval and submission.  

## 2015-10-29 NOTE — Telephone Encounter (Signed)
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 Pt is needing refill on fish oil to be sent to [facility]. Please advise.

## 2015-11-11 ENCOUNTER — Encounter: Payer: Self-pay | Admitting: Family Medicine

## 2015-11-11 ENCOUNTER — Ambulatory Visit (INDEPENDENT_AMBULATORY_CARE_PROVIDER_SITE_OTHER): Payer: BLUE CROSS/BLUE SHIELD | Admitting: Family Medicine

## 2015-11-11 VITALS — BP 116/82 | HR 72 | Temp 97.8°F | Resp 18 | Ht 73.0 in | Wt 207.6 lb

## 2015-11-11 DIAGNOSIS — K5900 Constipation, unspecified: Secondary | ICD-10-CM | POA: Diagnosis not present

## 2015-11-11 DIAGNOSIS — R5383 Other fatigue: Secondary | ICD-10-CM

## 2015-11-11 DIAGNOSIS — R634 Abnormal weight loss: Secondary | ICD-10-CM

## 2015-11-11 DIAGNOSIS — E785 Hyperlipidemia, unspecified: Secondary | ICD-10-CM

## 2015-11-11 DIAGNOSIS — Z9289 Personal history of other medical treatment: Secondary | ICD-10-CM

## 2015-11-11 DIAGNOSIS — R739 Hyperglycemia, unspecified: Secondary | ICD-10-CM | POA: Insufficient documentation

## 2015-11-11 DIAGNOSIS — H9311 Tinnitus, right ear: Secondary | ICD-10-CM

## 2015-11-11 DIAGNOSIS — Z79899 Other long term (current) drug therapy: Secondary | ICD-10-CM

## 2015-11-11 DIAGNOSIS — Z23 Encounter for immunization: Secondary | ICD-10-CM

## 2015-11-11 DIAGNOSIS — K5909 Other constipation: Secondary | ICD-10-CM | POA: Insufficient documentation

## 2015-11-11 DIAGNOSIS — F411 Generalized anxiety disorder: Secondary | ICD-10-CM | POA: Insufficient documentation

## 2015-11-11 HISTORY — DX: Hyperglycemia, unspecified: R73.9

## 2015-11-11 HISTORY — DX: Other constipation: K59.09

## 2015-11-11 MED ORDER — ALPRAZOLAM 0.25 MG PO TABS: 0.2500 mg | ORAL_TABLET | Freq: Two times a day (BID) | ORAL | Status: DC | PRN

## 2015-11-11 MED ORDER — VASCEPA 1 G PO CAPS: 2.0000 | ORAL_CAPSULE | Freq: Two times a day (BID) | ORAL | Status: DC

## 2015-11-11 NOTE — Addendum Note (Signed)
Addended by: Steele Sizer F on: 11/11/2015 10:33 AM   Modules accepted: Orders, SmartSet

## 2015-11-11 NOTE — Progress Notes (Addendum)
Name: Gilbert Buchanan   MRN: [id number]    DOB: Dec 04, 1970   Date:11/11/2015       Progress Note  Subjective  Chief Complaint  Chief Complaint  Patient presents with  . Medication Refill    6 month F/U and has losted 7 pounds since last visit since decreasing carbs and sugars intake   . Hyperlipidemia  . Anxiety    Patient states medication helps with excessive worrying, and will sleep about 6 hours of sleep nightly once taken the medication    HPI  GAD: he also has OCD, he has tried multiple medications in the past - but states he could not tolerate the side effects of medications, currently taking low dose Alprazolam twice daily. Discussed Xanax XR and Buspar and he will research at home before his next visit  Hyperlipidemia: he has been taking Vascepa for the past 2 years, however before last labs he was off medication for about a month and was back on it for about 2 weeks. He has changed his diet since Fall of 2016. He stopped drinking beer (  Used to drink 20-30 beers on weekends ), also changed his diet- high protein, high fiber, low carb and low sugar.   Obesity: he has always been obese since childhood. He lost a lot of weight since fall last year, doing well, his weight has been stable now, around 200 lbs over the past few months .  Chronic Constipation: doing well with dietary modification, states Linzess was too strong.   Hyperglycemia: no polyphagia, polydipsia or polyuria, lost weight, eating healthier  Fatigue: usually at the end of the day, but able to bounce back. He is very busy at work.  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 He has also noticed some hearing loss.    GAD 7 : Generalized Anxiety Score 11/11/2015  Nervous, Anxious, on Edge 3  Control/stop worrying 3  Worry too much - different things 3  Trouble relaxing 3  Restless 3  Easily annoyed or irritable 2  Afraid - awful  might happen 3  Total GAD 7 Score 20  Anxiety Difficulty Extremely difficult     Patient Active Problem List   Diagnosis Date Noted  . Dyslipidemia 11/11/2015  . Hyperglycemia 11/11/2015  . Chronic constipation 11/11/2015  . GAD (generalized anxiety disorder) 11/11/2015  . Prostatitis 05/05/2015  . BPH with obstruction/lower urinary tract symptoms 05/05/2015    Past Surgical History  Procedure Laterality Date  . Hernia repair    . Varicocele excision      Family History  Problem Relation Age of Onset  . Testicular cancer Father   . Polycystic kidney disease    . Kidney disease Mother   . Prostate cancer Neg Hx     Social History   Social History  . Marital Status: Married    Spouse Name: N/A  . Number of Children: N/A  . Years of Education: N/A   Occupational History  . Not on file.   Social History Main Topics  . Smoking status: Former Smoker -- 25 years    Types: Cigarettes, Cigars  . Smokeless tobacco: Current User    Types: Snuff     Comment: occasionally  . Alcohol Use: 0.0 oz/week    0 Standard drinks or equivalent per week     Comment: occasionally  . Drug Use: No  . Sexual Activity:  Partners: Female   Other Topics Concern  . Not on file   Social History Narrative     Current outpatient prescriptions:  .  ALPRAZolam (XANAX) 0.25 MG tablet, Take 1 tablet (0.25 mg total) by mouth every 12 (twelve) hours as needed for anxiety., Disp: 180 tablet, Rfl: 0 .  tamsulosin (FLOMAX) 0.4 MG CAPS capsule, Take 1 capsule (0.4 mg total) by mouth daily., Disp: 90 capsule, Rfl: 3 .  VASCEPA 1 g CAPS, Take 2 capsules by mouth 2 (two) times daily., Disp: 180 capsule, Rfl: 0  Allergies  Allergen Reactions  . Acyclovir And Related      ROS  Constitutional: Negative for fever , positive for weight change.  Respiratory: Negative for cough and shortness of breath.   Cardiovascular: Negative for chest pain or palpitations.  Gastrointestinal: Negative for  abdominal pain, no bowel changes.  Musculoskeletal: Negative for gait problem or joint swelling.  Skin: Negative for rash.  Neurological: Negative for dizziness or headache.  No other specific complaints in a complete review of systems (except as listed in HPI above).  Objective  Filed Vitals:   11/11/15 0921  BP: 116/82  Pulse: 72  Temp: 97.8 F (36.6 C)  TempSrc: Oral  Resp: 18  Height: 6\' 1"  (1.854 m)  Weight: 207 lb 9.6 oz (94.167 kg)  SpO2: 97%    Body mass index is 27.4 kg/(m^2).  Physical Exam  Constitutional: Patient appears well-developed and well-nourished.  No distress.  HEENT: head atraumatic, normocephalic, pupils equal and reactive to light,neck supple, throat within normal limits Cardiovascular: Normal rate, regular rhythm and normal heart sounds.  No murmur heard. No BLE edema. Pulmonary/Chest: Effort normal and breath sounds normal. No respiratory distress. Abdominal: Soft.  There is no tenderness. Psychiatric: Patient has a normal mood and affect. behavior is normal. Judgment and thought content normal.  PHQ2/9: Depression screen [facility] 2/9 11/11/2015 07/03/2015 04/30/2015 12/05/2014  Decreased Interest 0 0 0 0  Down, Depressed, Hopeless 0 0 0 0  PHQ - 2 Score 0 0 0 0    Fall Risk: Fall Risk  11/11/2015 07/03/2015 04/30/2015 01/29/2015 12/05/2014  Falls in the past year? No No No No No    Functional Status Survey: Is the patient deaf or have difficulty hearing?: No Does the patient have difficulty seeing, even when wearing glasses/contacts?: No Does the patient have difficulty concentrating, remembering, or making decisions?: No Does the patient have difficulty walking or climbing stairs?: No Does the patient have difficulty dressing or bathing?: No Does the patient have difficulty doing errands alone such as visiting a doctor's office or shopping?: No    Assessment & Plan  1. Dyslipidemia  - VASCEPA 1 g CAPS; Take 2 capsules by mouth 2 (two) times  daily.  Dispense: 180 capsule; Refill: 0 - Lipid panel  2. Hyperglycemia  - Hemoglobin A1c  3. Chronic constipation  Discussed dietary changes again  4. GAD (generalized anxiety disorder)  - ALPRAZolam (XANAX) 0.25 MG tablet; Take 1 tablet (0.25 mg total) by mouth every 12 (twelve) hours as needed for anxiety.  Dispense: 180 tablet; Refill: 0  5. Hx of long-term treatment with high-risk medication  - Comprehensive metabolic panel  6. Weight loss  Likely from dietary modification, but we will check labs - TSH - Vitamin B12 - VITAMIN D 25 Hydroxy (Vit-D Deficiency, Fractures) - CBC with Differential/Platelet - Sedimentation rate - C-reactive protein  7. Other fatigue  - TSH - Vitamin B12 - VITAMIN D 25 Hydroxy (  Vit-D Deficiency, Fractures) - CBC with Differential/Platelet - Sedimentation rate - C-reactive protein   8. Tinnitus of right ear  - Ambulatory referral to ENT   9. Need for Tdap vaccination  - Tdap vaccine greater than or equal to 7yo IM

## 2015-11-11 NOTE — Patient Instructions (Signed)
Research Xanax XR and Buspar before your next visit

## 2015-11-15 DIAGNOSIS — R5383 Other fatigue: Secondary | ICD-10-CM | POA: Diagnosis not present

## 2015-11-15 DIAGNOSIS — R7309 Other abnormal glucose: Secondary | ICD-10-CM | POA: Diagnosis not present

## 2015-11-15 DIAGNOSIS — R739 Hyperglycemia, unspecified: Secondary | ICD-10-CM | POA: Diagnosis not present

## 2015-11-15 DIAGNOSIS — R634 Abnormal weight loss: Secondary | ICD-10-CM | POA: Diagnosis not present

## 2015-11-15 DIAGNOSIS — E785 Hyperlipidemia, unspecified: Secondary | ICD-10-CM | POA: Diagnosis not present

## 2015-11-16 LAB — CBC WITH DIFFERENTIAL/PLATELET
Basophils Absolute: 0 10*3/uL (ref 0.0–0.2)
Basos: 1 %
EOS (ABSOLUTE): 0.1 10*3/uL (ref 0.0–0.4)
Eos: 2 %
Hematocrit: 47.5 % (ref 37.5–51.0)
Hemoglobin: 16.3 g/dL (ref 12.6–17.7)
Immature Grans (Abs): 0 10*3/uL (ref 0.0–0.1)
Immature Granulocytes: 0 %
Lymphocytes Absolute: 1.5 10*3/uL (ref 0.7–3.1)
Lymphs: 36 %
MCH: 32.7 pg (ref 26.6–33.0)
MCHC: 34.3 g/dL (ref 31.5–35.7)
MCV: 95 fL (ref 79–97)
Monocytes Absolute: 0.3 10*3/uL (ref 0.1–0.9)
Monocytes: 6 %
Neutrophils Absolute: 2.3 10*3/uL (ref 1.4–7.0)
Neutrophils: 55 %
Platelets: 208 10*3/uL (ref 150–379)
RBC: 4.99 x10E6/uL (ref 4.14–5.80)
RDW: 12.9 % (ref 12.3–15.4)
WBC: 4.2 10*3/uL (ref 3.4–10.8)

## 2015-11-16 LAB — C-REACTIVE PROTEIN: CRP: <0.3 mg/L (ref 0.0–4.9)

## 2015-11-16 LAB — TSH: TSH: 1.26 u[IU]/mL (ref 0.450–4.500)

## 2015-11-16 LAB — VITAMIN D 25 HYDROXY (VIT D DEFICIENCY, FRACTURES): Vit D, 25-Hydroxy: 45.3 ng/mL (ref 30.0–100.0)

## 2015-11-16 LAB — SEDIMENTATION RATE: Sed Rate: 2 mm/hr (ref 0–15)

## 2015-11-16 LAB — VITAMIN B12: Vitamin B-12: 874 pg/mL (ref 211–946)

## 2015-11-19 ENCOUNTER — Telehealth: Payer: Self-pay | Admitting: Family Medicine

## 2015-11-19 NOTE — Telephone Encounter (Signed)
Patient is returning someone call. I could not tell who called him

## 2015-11-19 NOTE — Telephone Encounter (Signed)
Patient was calling regarding labs since his mailbox was full and was given lab results. But stated there able to add the Lipid panel and A1C but not the CMP due to the time has been expired since blood was taken on the 11/15/15.

## 2015-11-20 LAB — LIPID PANEL W/O CHOL/HDL RATIO
Cholesterol, Total: 218 mg/dL — ABNORMAL HIGH (ref 100–199)
HDL: 40 mg/dL (ref >39)
LDL Calculated: 153 mg/dL — ABNORMAL HIGH (ref 0–99)
Triglycerides: 127 mg/dL (ref 0–149)
VLDL Cholesterol Cal: 25 mg/dL (ref 5–40)

## 2015-11-20 LAB — HGB A1C W/O EAG: Hgb A1c MFr Bld: 5.2 % (ref 4.8–5.6)

## 2015-11-20 LAB — SPECIMEN STATUS REPORT

## 2015-12-05 DIAGNOSIS — H903 Sensorineural hearing loss, bilateral: Secondary | ICD-10-CM | POA: Diagnosis not present

## 2015-12-05 DIAGNOSIS — H9311 Tinnitus, right ear: Secondary | ICD-10-CM | POA: Diagnosis not present

## 2016-01-08 ENCOUNTER — Encounter: Payer: Self-pay | Admitting: Family Medicine

## 2016-01-08 ENCOUNTER — Ambulatory Visit (INDEPENDENT_AMBULATORY_CARE_PROVIDER_SITE_OTHER): Payer: BLUE CROSS/BLUE SHIELD | Admitting: Family Medicine

## 2016-01-08 VITALS — BP 138/80 | HR 79 | Temp 98.1°F | Resp 18 | Ht 73.0 in | Wt 202.4 lb

## 2016-01-08 DIAGNOSIS — M5440 Lumbago with sciatica, unspecified side: Secondary | ICD-10-CM | POA: Diagnosis not present

## 2016-01-08 DIAGNOSIS — R202 Paresthesia of skin: Secondary | ICD-10-CM | POA: Diagnosis not present

## 2016-01-08 MED ORDER — PREDNISONE 10 MG (48) PO TBPK: ORAL_TABLET | Freq: Every day | ORAL | 0 refills | Status: DC

## 2016-01-08 NOTE — Progress Notes (Addendum)
Name: Gilbert Buchanan   MRN: [id number]    DOB: Feb 08, 1971   Date:01/08/2016       Progress Note  Subjective  Chief Complaint  Chief Complaint  Patient presents with  . Leg Pain    rt side leg pain since Saturday  . Numbness    also on top of foot on rightside    HPI  Low back pain/right foot numbness: he states that he was working on his cabin this past weekend and developed some low back pain, bilaterally. Pain is described as an aching sensation. He drove home and when he got out of the car that afternoon he noticed that the right was sore/like a pull muscle feeling , pain only present when walking. The following morning he noticed that top of right foot was numb. He has since noticed that because the foot is numb he can't control it and his right foot seems to slap to the ground. Symptoms are worse with activity, also states back gets stiff when sitting for a prolonged period of time. He denies saddle anesthesia.    Patient Active Problem List   Diagnosis Date Noted  . Dyslipidemia 11/11/2015  . Hyperglycemia 11/11/2015  . Chronic constipation 11/11/2015  . GAD (generalized anxiety disorder) 11/11/2015  . Prostatitis 05/05/2015  . 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 HERNIA REPAIR    . VARICOCELE EXCISION      Family History  Problem Relation Age of Onset  . Testicular cancer Father   . Polycystic kidney disease    . Kidney disease Mother   . Prostate cancer Neg Hx     Social History   Social History  . Marital status: Married    Spouse name: N/A  . Number of children: N/A  . Years of education: N/A   Occupational History  . Not on file.   Social History Main Topics  . Smoking status: Former Smoker    Years: 25.00    Types: Cigarettes, Cigars  . Smokeless tobacco: Current User    Types: Snuff     Comment: occasionally  . Alcohol use 0.0 oz/week     Comment: occasionally  .  Drug use: No  . Sexual activity: Yes    Partners: Female   Other Topics Concern  . Not on file   Social History Narrative  . No narrative on file     Current Outpatient Prescriptions:  .  ALPRAZolam (XANAX) 0.25 MG tablet, Take 1 tablet (0.25 mg total) by mouth every 12 (twelve) hours as needed for anxiety., Disp: 180 tablet, Rfl: 0 .  tamsulosin (FLOMAX) 0.4 MG CAPS capsule, Take 1 capsule (0.4 mg total) by mouth daily., Disp: 90 capsule, Rfl: 3 .  VASCEPA 1 g CAPS, Take 2 capsules by mouth 2 (two) times daily., Disp: 180 capsule, Rfl: 0  Allergies  Allergen Reactions  . Acyclovir And Related      ROS  Ten systems reviewed and is negative except as mentioned in HPI   Objective  Vitals:   01/08/16 1446  BP: 138/80  Pulse: 79  Resp: 18  Temp: 98.1 F (36.7 C)  SpO2: 97%  Weight: 202 lb 7 oz (91.8 kg)  Height: 6\' 1"  (1.854 m)    Body mass index is 26.71 kg/m.  Physical Exam  Constitutional: Patient appears well-developed and well-nourished.  No distress.  HEENT: head atraumatic, normocephalic, pupils equal and reactive to light,  neck supple, throat within normal limits Cardiovascular: Normal rate, regular rhythm and normal heart sounds.  No murmur heard. No BLE edema. Pulmonary/Chest: Effort normal and breath sounds normal. No respiratory distress. Abdominal: Soft.  There is no tenderness. Psychiatric: Patient has a normal mood and affect. behavior is normal. Judgment and thought content normal. Muscular Skeletal: negative straight leg raise, pain during palpation of lumbar spine, but normal rom. Decrease sensation on dorsal aspect of right foot. Right foot slaps on the ground when he walks but no foot drop.   Recent Results (from the past 2160 hour(s))  TSH     Status: None   Collection Time: 11/15/15  8:04 AM  Result Value Ref Range   TSH 1.260 0.450 - 4.500 uIU/mL  Vitamin B12     Status: None   Collection Time: 11/15/15  8:04 AM  Result Value Ref Range    Vitamin B-12 874 211 - 946 pg/mL  VITAMIN D 25 Hydroxy (Vit-D Deficiency, Fractures)     Status: None   Collection Time: 11/15/15  8:04 AM  Result Value Ref Range   Vit D, 25-Hydroxy 45.3 30.0 - 100.0 ng/mL    Comment: Vitamin D deficiency has been defined by the [place] and an Endocrine Society practice guideline as a level of serum 25-OH vitamin D less than 20 ng/mL (1,2). The Endocrine Society went on to further define vitamin D insufficiency as a level between 21 and 29 ng/mL (2). 1. IOM (Institute of Medicine). 2010. Dietary reference    intakes for calcium and D. [place]: The    [employer]. 2. Holick MF, Binkley [place], Bischoff-Ferrari HA, et al.    Evaluation, treatment, and prevention of vitamin D    deficiency: an Endocrine Society clinical practice    guideline. JCEM. 2011 Jul; 96(7):1911-30.   CBC with Differential/Platelet     Status: None   Collection Time: 11/15/15  8:04 AM  Result Value Ref Range   WBC 4.2 3.4 - 10.8 x10E3/uL   RBC 4.99 4.14 - 5.80 x10E6/uL   Hemoglobin 16.3 12.6 - 17.7 g/dL   Hematocrit 47.5 37.5 - 51.0 %   MCV 95 79 - 97 fL   MCH 32.7 26.6 - 33.0 pg   MCHC 34.3 31.5 - 35.7 g/dL   RDW 12.9 12.3 - 15.4 %   Platelets 208 150 - 379 x10E3/uL   Neutrophils 55 %   Lymphs 36 %   Monocytes 6 %   Eos 2 %   Basos 1 %   Neutrophils Absolute 2.3 1.4 - 7.0 x10E3/uL   Lymphocytes Absolute 1.5 0.7 - 3.1 x10E3/uL   Monocytes Absolute 0.3 0.1 - 0.9 x10E3/uL   EOS (ABSOLUTE) 0.1 0.0 - 0.4 x10E3/uL   Basophils Absolute 0.0 0.0 - 0.2 x10E3/uL   Immature Granulocytes 0 %   Immature Grans (Abs) 0.0 0.0 - 0.1 x10E3/uL  Sedimentation rate     Status: None   Collection Time: 11/15/15  8:04 AM  Result Value Ref Range   Sed Rate 2 0 - 15 mm/hr  C-reactive protein     Status: None   Collection Time: 11/15/15  8:04 AM  Result Value Ref Range   CRP <0.3 0.0 - 4.9 mg/L  Lipid Panel w/o Chol/HDL Ratio     Status: Abnormal    Collection Time: 11/15/15  8:04 AM  Result Value Ref Range   Cholesterol, Total 218 (H) 100 - 199 mg/dL   Triglycerides 127 0 - 149 mg/dL  HDL 40 >39 mg/dL   VLDL Cholesterol Cal 25 5 - 40 mg/dL   LDL Calculated 153 (H) 0 - 99 mg/dL  Hgb A1c w/o eAG     Status: None   Collection Time: 11/15/15  8:04 AM  Result Value Ref Range   Hgb A1c MFr Bld 5.2 4.8 - 5.6 %    Comment:          Pre-diabetes: 5.7 - 6.4          Diabetes: >6.4          Glycemic control for adults with diabetes: <7.0   Specimen status report     Status: None   Collection Time: 11/15/15  8:04 AM  Result Value Ref Range   specimen status report Comment     Comment: Written Authorization Written Authorization Written Authorization Received. Authorization received from [place] 11-20-2015 Logged by Vista Lawman       PHQ2/9: Depression screen [facility] 2/9 01/08/2016 11/11/2015 07/03/2015 04/30/2015 12/05/2014  Decreased Interest 0 0 0 0 0  Down, Depressed, Hopeless 0 0 0 0 0  PHQ - 2 Score 0 0 0 0 0    Fall Risk: Fall Risk  01/08/2016 11/11/2015 07/03/2015 04/30/2015 01/29/2015  Falls in the past year? No No No No No     Functional Status Survey: Is the patient deaf or have difficulty hearing?: No Does the patient have difficulty seeing, even when wearing glasses/contacts?: No Does the patient have difficulty concentrating, remembering, or making decisions?: No Does the patient have difficulty walking or climbing stairs?: No Does the patient have difficulty dressing or bathing?: No Does the patient have difficulty doing errands alone such as visiting a doctor's office or shopping?: No    Assessment & Plan  1. Bilateral low back pain with sciatica, sciatica laterality unspecified, unspecified chronicity  - predniSONE (STERAPRED UNI-PAK 48 TAB) 10 MG (48) TBPK tablet; Take by mouth daily.  Dispense: 48 tablet; Refill: 0  2. Paresthesia of right foot  We will try prednisone taper ( discussed side effects)  however if no improvement he will need EMG/NCS possible MRI lumbar spine.  - predniSONE (STERAPRED UNI-PAK 48 TAB) 10 MG (48) TBPK tablet; Take by mouth daily.  Dispense: 48 tablet; Refill: 0

## 2016-01-28 ENCOUNTER — Telehealth: Payer: Self-pay | Admitting: Family Medicine

## 2016-01-28 ENCOUNTER — Other Ambulatory Visit: Payer: Self-pay | Admitting: Family Medicine

## 2016-01-28 DIAGNOSIS — R202 Paresthesia of skin: Secondary | ICD-10-CM

## 2016-01-28 DIAGNOSIS — M5441 Lumbago with sciatica, right side: Secondary | ICD-10-CM

## 2016-01-28 NOTE — Telephone Encounter (Signed)
Ordered lumbar spine MRI

## 2016-01-29 ENCOUNTER — Telehealth: Payer: Self-pay | Admitting: Family Medicine

## 2016-01-30 DIAGNOSIS — M9902 Segmental and somatic dysfunction of thoracic region: Secondary | ICD-10-CM | POA: Diagnosis not present

## 2016-01-30 DIAGNOSIS — M9904 Segmental and somatic dysfunction of sacral region: Secondary | ICD-10-CM | POA: Diagnosis not present

## 2016-01-30 DIAGNOSIS — S29019A Strain of muscle and tendon of unspecified wall of thorax, initial encounter: Secondary | ICD-10-CM | POA: Diagnosis not present

## 2016-01-30 DIAGNOSIS — M9903 Segmental and somatic dysfunction of lumbar region: Secondary | ICD-10-CM | POA: Diagnosis not present

## 2016-01-30 NOTE — Telephone Encounter (Signed)
[employer] and got authorization for [place] [id number] Valid from 01/30/16 through 02/28/16. [facility] will call patient to set up appointment and ask questions for MRI Scan.

## 2016-01-31 DIAGNOSIS — M9904 Segmental and somatic dysfunction of sacral region: Secondary | ICD-10-CM | POA: Diagnosis not present

## 2016-01-31 DIAGNOSIS — M9902 Segmental and somatic dysfunction of thoracic region: Secondary | ICD-10-CM | POA: Diagnosis not present

## 2016-01-31 DIAGNOSIS — M9903 Segmental and somatic dysfunction of lumbar region: Secondary | ICD-10-CM | POA: Diagnosis not present

## 2016-01-31 DIAGNOSIS — S29019A Strain of muscle and tendon of unspecified wall of thorax, initial encounter: Secondary | ICD-10-CM | POA: Diagnosis not present

## 2016-02-03 DIAGNOSIS — M9902 Segmental and somatic dysfunction of thoracic region: Secondary | ICD-10-CM | POA: Diagnosis not present

## 2016-02-03 DIAGNOSIS — S29019A Strain of muscle and tendon of unspecified wall of thorax, initial encounter: Secondary | ICD-10-CM | POA: Diagnosis not present

## 2016-02-03 DIAGNOSIS — M9903 Segmental and somatic dysfunction of lumbar region: Secondary | ICD-10-CM | POA: Diagnosis not present

## 2016-02-03 DIAGNOSIS — M9904 Segmental and somatic dysfunction of sacral region: Secondary | ICD-10-CM | POA: Diagnosis not present

## 2016-02-05 DIAGNOSIS — M9902 Segmental and somatic dysfunction of thoracic region: Secondary | ICD-10-CM | POA: Diagnosis not present

## 2016-02-05 DIAGNOSIS — M9903 Segmental and somatic dysfunction of lumbar region: Secondary | ICD-10-CM | POA: Diagnosis not present

## 2016-02-05 DIAGNOSIS — M9904 Segmental and somatic dysfunction of sacral region: Secondary | ICD-10-CM | POA: Diagnosis not present

## 2016-02-05 DIAGNOSIS — S29019A Strain of muscle and tendon of unspecified wall of thorax, initial encounter: Secondary | ICD-10-CM | POA: Diagnosis not present

## 2016-02-11 ENCOUNTER — Ambulatory Visit: Payer: BLUE CROSS/BLUE SHIELD | Admitting: Family Medicine

## 2016-02-12 DIAGNOSIS — S29019A Strain of muscle and tendon of unspecified wall of thorax, initial encounter: Secondary | ICD-10-CM | POA: Diagnosis not present

## 2016-02-12 DIAGNOSIS — M9903 Segmental and somatic dysfunction of lumbar region: Secondary | ICD-10-CM | POA: Diagnosis not present

## 2016-02-12 DIAGNOSIS — M9902 Segmental and somatic dysfunction of thoracic region: Secondary | ICD-10-CM | POA: Diagnosis not present

## 2016-02-12 DIAGNOSIS — M9904 Segmental and somatic dysfunction of sacral region: Secondary | ICD-10-CM | POA: Diagnosis not present

## 2016-02-19 DIAGNOSIS — M9903 Segmental and somatic dysfunction of lumbar region: Secondary | ICD-10-CM | POA: Diagnosis not present

## 2016-02-19 DIAGNOSIS — M9902 Segmental and somatic dysfunction of thoracic region: Secondary | ICD-10-CM | POA: Diagnosis not present

## 2016-02-19 DIAGNOSIS — M9904 Segmental and somatic dysfunction of sacral region: Secondary | ICD-10-CM | POA: Diagnosis not present

## 2016-02-19 DIAGNOSIS — S29019A Strain of muscle and tendon of unspecified wall of thorax, initial encounter: Secondary | ICD-10-CM | POA: Diagnosis not present

## 2016-03-04 ENCOUNTER — Other Ambulatory Visit: Payer: Self-pay

## 2016-03-04 DIAGNOSIS — S29019A Strain of muscle and tendon of unspecified wall of thorax, initial encounter: Secondary | ICD-10-CM | POA: Diagnosis not present

## 2016-03-04 DIAGNOSIS — M9904 Segmental and somatic dysfunction of sacral region: Secondary | ICD-10-CM | POA: Diagnosis not present

## 2016-03-04 DIAGNOSIS — M9902 Segmental and somatic dysfunction of thoracic region: Secondary | ICD-10-CM | POA: Diagnosis not present

## 2016-03-04 DIAGNOSIS — M9903 Segmental and somatic dysfunction of lumbar region: Secondary | ICD-10-CM | POA: Diagnosis not present

## 2016-03-04 NOTE — Telephone Encounter (Signed)
Patient requesting refill of Bupropion with 90 pills.

## 2016-03-05 MED ORDER — BUPROPION HCL ER (XL) 150 MG PO TB24: 150.0000 mg | ORAL_TABLET | Freq: Every day | ORAL | 0 refills | Status: DC

## 2016-03-11 ENCOUNTER — Other Ambulatory Visit: Payer: Self-pay | Admitting: Family Medicine

## 2016-03-11 ENCOUNTER — Telehealth: Payer: Self-pay | Admitting: Family Medicine

## 2016-03-11 DIAGNOSIS — E785 Hyperlipidemia, unspecified: Secondary | ICD-10-CM

## 2016-03-11 MED ORDER — VASCEPA 1 G PO CAPS: 2.0000 | ORAL_CAPSULE | Freq: Two times a day (BID) | ORAL | 0 refills | Status: DC

## 2016-03-11 NOTE — Telephone Encounter (Signed)
Pt requesting refills on Vascepa and Alprazolam. Pt states he read up on the time release Alparzolam sounds better to him. Please advise.

## 2016-03-11 NOTE — Telephone Encounter (Signed)
Sent Vascepa but needs to be seen for alprazolam refill

## 2016-03-11 NOTE — Telephone Encounter (Signed)
Did you contact him about the MRI. See previous message please

## 2016-03-12 NOTE — Telephone Encounter (Signed)
Left voice mail for patient to call back regarding medication.

## 2016-03-17 ENCOUNTER — Encounter: Payer: BLUE CROSS/BLUE SHIELD | Admitting: Family Medicine

## 2016-03-18 DIAGNOSIS — S29019A Strain of muscle and tendon of unspecified wall of thorax, initial encounter: Secondary | ICD-10-CM | POA: Diagnosis not present

## 2016-03-18 DIAGNOSIS — M9904 Segmental and somatic dysfunction of sacral region: Secondary | ICD-10-CM | POA: Diagnosis not present

## 2016-03-18 DIAGNOSIS — M9902 Segmental and somatic dysfunction of thoracic region: Secondary | ICD-10-CM | POA: Diagnosis not present

## 2016-03-18 DIAGNOSIS — M9903 Segmental and somatic dysfunction of lumbar region: Secondary | ICD-10-CM | POA: Diagnosis not present

## 2016-03-19 ENCOUNTER — Ambulatory Visit (INDEPENDENT_AMBULATORY_CARE_PROVIDER_SITE_OTHER): Payer: BLUE CROSS/BLUE SHIELD | Admitting: Family Medicine

## 2016-03-19 ENCOUNTER — Encounter: Payer: Self-pay | Admitting: Family Medicine

## 2016-03-19 VITALS — BP 116/68 | HR 70 | Temp 98.0°F | Resp 16 | Ht 74.0 in | Wt 203.2 lb

## 2016-03-19 DIAGNOSIS — R202 Paresthesia of skin: Secondary | ICD-10-CM

## 2016-03-19 DIAGNOSIS — F411 Generalized anxiety disorder: Secondary | ICD-10-CM

## 2016-03-19 DIAGNOSIS — Z23 Encounter for immunization: Secondary | ICD-10-CM

## 2016-03-19 DIAGNOSIS — E785 Hyperlipidemia, unspecified: Secondary | ICD-10-CM

## 2016-03-19 DIAGNOSIS — Z0000 Encounter for general adult medical examination without abnormal findings: Secondary | ICD-10-CM

## 2016-03-19 MED ORDER — ALPRAZOLAM ER 0.5 MG PO TB24: 0.5000 mg | ORAL_TABLET | Freq: Every day | ORAL | 0 refills | Status: DC

## 2016-03-19 MED ORDER — BUSPIRONE HCL 5 MG PO TABS
5.0000 mg | ORAL_TABLET | Freq: Three times a day (TID) | ORAL | 0 refills | Status: DC
Start: 2016-03-19 — End: 2016-05-08

## 2016-03-19 MED ORDER — BUSPIRONE HCL 5 MG PO TABS: 5.0000 mg | ORAL_TABLET | Freq: Three times a day (TID) | ORAL | 0 refills | Status: DC

## 2016-03-19 NOTE — Progress Notes (Signed)
Name: Gilbert Buchanan   MRN: [id number]    DOB: 1970-12-28   Date:03/19/2016       Progress Note  Subjective  Chief Complaint  Chief Complaint  Patient presents with  . Annual Exam    HPI  Annual Exam: patient is married, still smokes cigars occasionally, no longer drinking, no ED, he goes to Urologist for BPH but doing very well with weight loss.   Dyslipidemia: doing much better on Vacepa and life style modification. No side effects of medication  GAD: he also has OCD, he has tried multiple medications in the past - but states he could not tolerate the side effects of medications, currently taking low dose Alprazolam twice daily. Discussed Xanax XR and Buspar and he researched at home, he would like to try Alprazolam XR - it will be slightly higher than he is use to, may ask pharmacist if he can cut in half.   Obesity: he has always been obese since childhood. He lost a lot of weight since fall last year, doing well, his weight has been stable now, around 200 lbs over the past few months. He is very compliant with his diet .  Low back pain with right foot numbness: he was working on his [employer], he tried prednisone but caused worsening of anxiety, he was scheduled for MRI lumbar spine, but it was too expensive so he went to a chiropractor and had some adjustments, feeling much better, numbness about 98 % better and no longer has back pain. He is doing home PT for  his back  Patient Active Problem List   Diagnosis Date Noted  . Dyslipidemia 11/11/2015  . Hyperglycemia 11/11/2015  . Chronic constipation 11/11/2015  . GAD (generalized anxiety disorder) 11/11/2015  . Prostatitis 05/05/2015  . 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 HERNIA REPAIR    . VARICOCELE EXCISION      Family History  Problem Relation Age of Onset  . Kidney disease Mother   . Testicular cancer Father   . Polycystic  kidney disease    . Prostate cancer Neg Hx     Social History   Social History  . Marital status: Married    Spouse name: N/A  . Number of children: N/A  . Years of education: N/A   Occupational History  . Not on file.   Social History Main Topics  . Smoking status: Former Smoker    Years: 25.00    Types: Cigarettes, Cigars  . Smokeless tobacco: Former Systems developer    Types: Snuff     Comment: occasionally cigars  . Alcohol use 0.0 oz/week  . Drug use: No  . Sexual activity: Yes    Partners: Female   Other Topics Concern  . Not on file   Social History Narrative  . No narrative on file     Current Outpatient Prescriptions:  .  tamsulosin (FLOMAX) 0.4 MG CAPS capsule, Take 1 capsule (0.4 mg total) by mouth daily., Disp: 90 capsule, Rfl: 3 .  VASCEPA 1 g CAPS, Take 2 capsules by mouth 2 (two) times daily., Disp: 180 capsule, Rfl: 0 .  ALPRAZolam (XANAX XR) 0.5 MG 24 hr tablet, Take 1 tablet (0.5 mg total) by mouth daily., Disp: 30 tablet, Rfl: 0  Allergies  Allergen Reactions  . Acyclovir And Related      ROS  Constitutional: Negative for fever or weight change.  Respiratory: Negative for  cough and shortness of breath.   Cardiovascular: Negative for chest pain or palpitations.  Gastrointestinal: Negative for abdominal pain, no bowel changes.  Musculoskeletal: Negative for gait problem or joint swelling.  Skin: Negative for rash.  Neurological: Negative for dizziness or headache.  No other specific complaints in a complete review of systems (except as listed in HPI above).  Objective  Vitals:   03/19/16 1156  BP: 116/68  Pulse: 70  Resp: 16  Temp: 98 F (36.7 C)  SpO2: 96%  Weight: 203 lb 3 oz (92.2 kg)  Height: 6\' 2"  (1.88 m)    Body mass index is 26.09 kg/m.  Physical Exam  Constitutional: Patient appears well-developed and well-nourished. No distress.  HENT: Head: Normocephalic and atraumatic. Ears: B TMs ok, no erythema or effusion; Nose: Nose  normal. Mouth/Throat: Oropharynx is clear and moist. No oropharyngeal exudate.  Eyes: Conjunctivae and EOM are normal. Pupils are equal, round, and reactive to light. No scleral icterus.  Neck: Normal range of motion. Neck supple. No JVD present. No thyromegaly present.  Cardiovascular: Normal rate, regular rhythm and normal heart sounds.  No murmur heard. No BLE edema. Pulmonary/Chest: Effort normal and breath sounds normal. No respiratory distress. Abdominal: Soft. Bowel sounds are normal, no distension. There is no tenderness. no masses MALE GENITALIA: sees Urologist  RECTAL: not done Musculoskeletal: Normal range of motion, no joint effusions. No gross deformities Neurological: he is alert and oriented to person, place, and time. No cranial nerve deficit. Coordination, balance, strength, speech and gait are normal. Mild paresthesia on the top of right toe and foot Skin: Skin is warm and dry. No rash noted. No erythema.  Psychiatric: Patient has a normal mood and affect. behavior is normal. Judgment and thought content normal.  PHQ2/9: Depression screen [facility] 2/9 03/19/2016 01/08/2016 11/11/2015 07/03/2015 04/30/2015  Decreased Interest 0 0 0 0 0  Down, Depressed, Hopeless 0 0 0 0 0  PHQ - 2 Score 0 0 0 0 0     Fall Risk: Fall Risk  03/19/2016 01/08/2016 11/11/2015 07/03/2015 04/30/2015  Falls in the past year? No No No No No   GAD 7 : Generalized Anxiety Score 03/19/2016 11/11/2015  Nervous, Anxious, on Edge 1 3  Control/stop worrying 0 3  Worry too much - different things 3 3  Trouble relaxing 3 3  Restless 3 3  Easily annoyed or irritable 3 2  Afraid - awful might happen 3 3  Total GAD 7 Score 16 20  Anxiety Difficulty Very difficult Extremely difficult     Functional Status Survey: Is the patient deaf or have difficulty hearing?: No Does the patient have difficulty seeing, even when wearing glasses/contacts?: No Does the patient have difficulty concentrating, remembering, or making  decisions?: No Does the patient have difficulty walking or climbing stairs?: No Does the patient have difficulty dressing or bathing?: No Does the patient have difficulty doing errands alone such as visiting a doctor's office or shopping?: No    Assessment & Plan  1. Encounter for routine history and physical exam for male  Discussed importance of 150 minutes of physical activity weekly, eat two servings of fish weekly, eat one serving of tree nuts ( cashews, pistachios, pecans, almonds.Marland Kitchen) every other day, eat 6 servings of fruit/vegetables daily and drink plenty of water and avoid sweet beverages.   2. Needs flu shot  - Flu Vaccine QUAD 36+ mos IM  3. GAD (generalized anxiety disorder)  He refused to give me a drug specimen  for drug screen so he is willing to try Buspar - busPIRone (BUSPAR) 5 MG tablet; Take 1 tablet (5 mg total) by mouth 3 (three) times daily.  Dispense: 90 tablet; Refill: 0  4. Dyslipidemia  Continue medication   5. Paresthesia of right foot  Doing better with chiropractor care

## 2016-03-19 NOTE — Patient Instructions (Signed)
Mindfulness aps - such as [employer] - at [employer]

## 2016-03-25 ENCOUNTER — Ambulatory Visit: Payer: BLUE CROSS/BLUE SHIELD | Admitting: Family Medicine

## 2016-03-25 DIAGNOSIS — D2261 Melanocytic nevi of right upper limb, including shoulder: Secondary | ICD-10-CM | POA: Diagnosis not present

## 2016-04-02 DIAGNOSIS — M9904 Segmental and somatic dysfunction of sacral region: Secondary | ICD-10-CM | POA: Diagnosis not present

## 2016-04-02 DIAGNOSIS — M9902 Segmental and somatic dysfunction of thoracic region: Secondary | ICD-10-CM | POA: Diagnosis not present

## 2016-04-02 DIAGNOSIS — M9903 Segmental and somatic dysfunction of lumbar region: Secondary | ICD-10-CM | POA: Diagnosis not present

## 2016-04-02 DIAGNOSIS — S29019A Strain of muscle and tendon of unspecified wall of thorax, initial encounter: Secondary | ICD-10-CM | POA: Diagnosis not present

## 2016-04-15 ENCOUNTER — Other Ambulatory Visit: Payer: Self-pay | Admitting: Family Medicine

## 2016-04-15 DIAGNOSIS — E785 Hyperlipidemia, unspecified: Secondary | ICD-10-CM

## 2016-04-22 ENCOUNTER — Other Ambulatory Visit: Payer: BLUE CROSS/BLUE SHIELD

## 2016-04-22 DIAGNOSIS — R972 Elevated prostate specific antigen [PSA]: Secondary | ICD-10-CM

## 2016-04-23 LAB — PSA: Prostate Specific Ag, Serum: 1.1 ng/mL (ref 0.0–4.0)

## 2016-04-29 ENCOUNTER — Ambulatory Visit: Payer: BLUE CROSS/BLUE SHIELD | Admitting: Urology

## 2016-04-29 ENCOUNTER — Encounter: Payer: Self-pay | Admitting: Urology

## 2016-04-29 VITALS — BP 128/70 | HR 67 | Ht 74.0 in | Wt 203.4 lb

## 2016-04-29 DIAGNOSIS — Z125 Encounter for screening for malignant neoplasm of prostate: Secondary | ICD-10-CM

## 2016-04-29 DIAGNOSIS — N400 Enlarged prostate without lower urinary tract symptoms: Secondary | ICD-10-CM | POA: Diagnosis not present

## 2016-04-29 NOTE — Progress Notes (Signed)
04/29/2016 10:51 AM   Gilbert Buchanan 1970/06/17 [id number]  Referring provider: Ashok Norris, MD [address] [place], [place] [address]  Chief Complaint  Patient presents with  . Follow-up    prostatitis    HPI: The patient is a 45 y.o. who presents for yearly follow up.  1. BPH On flomax 0.4 mg daily. He has nocturia 5. He has frequency less than half the time and occasional urgency. He has no other urinary complaints at this time. He feels that he empty his bladder. He feels the symptoms dramatically worsened a few misses a dose of Flomax.  IPSS:8/2  2. Prostate cancer screening The patient request annual prostate cancer screening. No family history of prostate cancer. PSA: 1.1  in November 2017    PMH: Past Medical History:  Diagnosis Date  . Anxiety   . Elevated blood pressure (not hypertension)   . History of Lyme disease   . Hyperlipidemia   . Incomplete bladder emptying   . Over weight   . Prostatitis   . Stones, prostate     Surgical History: Past Surgical History:  Procedure Laterality Date  . HERNIA REPAIR    . VARICOCELE EXCISION      Home Medications:    Medication List       Accurate as of 04/29/16 10:51 AM. Always use your most recent med list.          busPIRone 5 MG tablet Commonly known as:  BUSPAR Take 1 tablet (5 mg total) by mouth 3 (three) times daily.   tamsulosin 0.4 MG Caps capsule Commonly known as:  FLOMAX Take 1 capsule (0.4 mg total) by mouth daily.   VASCEPA 1 g Caps Generic drug:  Icosapent Ethyl Take 2 capsules by mouth 2 (two) times daily.       Allergies:  Allergies  Allergen Reactions  . Acyclovir And Related     Family History: Family History  Problem Relation Age of Onset  . Kidney disease Mother   . Testicular cancer Father   . Polycystic kidney disease    . Prostate cancer Neg Hx     Social History:  reports that he has quit smoking. His smoking use included Cigarettes  and Cigars. He quit after 25.00 years of use. He has quit using smokeless tobacco. His smokeless tobacco use included Snuff. He reports that he drinks alcohol. He reports that he does not use drugs.  ROS: UROLOGY Frequent Urination?: No Hard to postpone urination?: No Burning/pain with urination?: No Get up at night to urinate?: No Leakage of urine?: No Urine stream starts and stops?: No Trouble starting stream?: No Do you have to strain to urinate?: No Blood in urine?: No Urinary tract infection?: No Sexually transmitted disease?: No Injury to kidneys or bladder?: No Painful intercourse?: No Weak stream?: No Erection problems?: No Penile pain?: No  Gastrointestinal Nausea?: No Vomiting?: No Indigestion/heartburn?: No Diarrhea?: No Constipation?: No  Constitutional Fever: No Night sweats?: No Weight loss?: No Fatigue?: No  Skin Skin rash/lesions?: No Itching?: No  Eyes Blurred vision?: No Double vision?: No  Ears/Nose/Throat Sore throat?: No Sinus problems?: No  Hematologic/Lymphatic Swollen glands?: No Easy bruising?: No  Cardiovascular Leg swelling?: No Chest pain?: No  Respiratory Cough?: No Shortness of breath?: No  Endocrine Excessive thirst?: No  Musculoskeletal Back pain?: No Joint pain?: No  Neurological Headaches?: No Dizziness?: No  Psychologic Depression?: No Anxiety?: No  Physical Exam: BP 128/70 (BP Location: Left Arm, Patient  Position: Sitting, Cuff Size: Normal)   Pulse 67   Ht 6\' 2"  (1.88 m)   Wt 203 lb 6.4 oz (92.3 kg)   BMI 26.12 kg/m   Constitutional:  Alert and oriented, No acute distress. HEENT: [place] AT, moist mucus membranes.  Trachea midline, no masses. Cardiovascular: No clubbing, cyanosis, or edema. Respiratory: Normal respiratory effort, no increased work of breathing. GI: Abdomen is soft, nontender, nondistended, no abdominal masses GU: No CVA tenderness. Normal phallus. Testicles descended equally  bilaterally benign. Left varicocele noted. DRE: 2+ smooth benign. Skin: No rashes, bruises or suspicious lesions. Lymph: No cervical or inguinal adenopathy. Neurologic: Grossly intact, no focal deficits, moving all 4 extremities. Psychiatric: Normal mood and affect.  Laboratory Data: Lab Results  Component Value Date   WBC 4.2 11/15/2015   HGB 15.3 01/24/2013   HCT 47.5 11/15/2015   MCV 95 11/15/2015   PLT 208 11/15/2015    Lab Results  Component Value Date   CREATININE 1.09 03/29/2015    No results found for: PSA  No results found for: TESTOSTERONE  Lab Results  Component Value Date   HGBA1C 5.2 11/15/2015    Urinalysis No results found for: COLORURINE, APPEARANCEUR, LABSPEC, PHURINE, GLUCOSEU, HGBUR, BILIRUBINUR, KETONESUR, PROTEINUR, UROBILINOGEN, NITRITE, LEUKOCYTESUR   Assessment & Plan:    1. BPH Continue Flomax   2. Prostate cancer screening I discussed wth the patient recommendations from the American urological Association for prostate cancer screening. I offered that the patient could increase his interval to at least every other year for the next decade or so. Per his request though, he would like to continue annual prostate cancer screening via PSA.  Return in about 1 year (around 04/29/2017) for with PSA prior.  Nickie Retort, MD  [facility] [address] [place], [place] [address] [phone]

## 2016-05-04 ENCOUNTER — Other Ambulatory Visit: Payer: Self-pay | Admitting: Family Medicine

## 2016-05-04 DIAGNOSIS — E785 Hyperlipidemia, unspecified: Secondary | ICD-10-CM

## 2016-05-08 ENCOUNTER — Other Ambulatory Visit: Payer: Self-pay | Admitting: Family Medicine

## 2016-05-08 ENCOUNTER — Telehealth: Payer: Self-pay | Admitting: Family Medicine

## 2016-05-08 DIAGNOSIS — E785 Hyperlipidemia, unspecified: Secondary | ICD-10-CM

## 2016-05-08 DIAGNOSIS — F411 Generalized anxiety disorder: Secondary | ICD-10-CM

## 2016-05-08 MED ORDER — BUSPIRONE HCL 5 MG PO TABS: 5.0000 mg | ORAL_TABLET | Freq: Three times a day (TID) | ORAL | 0 refills | Status: DC

## 2016-05-08 MED ORDER — VASCEPA 1 G PO CAPS: 2.0000 | ORAL_CAPSULE | Freq: Two times a day (BID) | ORAL | 0 refills | Status: DC

## 2016-05-08 NOTE — Telephone Encounter (Signed)
Pt needs refill on Vascepa, this one needs to be sent to [facility].Pt has been out for a week on this one.  Also needs Buspirone this one needs to go to the  [facility].

## 2016-05-12 ENCOUNTER — Other Ambulatory Visit: Payer: Self-pay

## 2016-05-12 DIAGNOSIS — F411 Generalized anxiety disorder: Secondary | ICD-10-CM

## 2016-05-12 NOTE — Telephone Encounter (Signed)
Please reorder was sent to wrong pharmacy

## 2016-05-12 NOTE — Telephone Encounter (Signed)
Pt stated that he has not used the [facility] in his chart in over a year.

## 2016-05-12 NOTE — Telephone Encounter (Signed)
Patient called back checking status on his refills. It shows that both prescription was sent to pharmacy however it was sent to the wrong pharmacy. Please [facility] to  [facility] and [facility]. He is asking that you please give him a call once this has been completed. Thank you

## 2016-05-12 NOTE — Telephone Encounter (Signed)
Please ask him to call pharmacy and ask to transfer the rx.

## 2016-06-02 ENCOUNTER — Other Ambulatory Visit: Payer: Self-pay

## 2016-06-02 DIAGNOSIS — F411 Generalized anxiety disorder: Secondary | ICD-10-CM

## 2016-06-02 MED ORDER — BUSPIRONE HCL 5 MG PO TABS: 5.0000 mg | ORAL_TABLET | Freq: Three times a day (TID) | ORAL | 0 refills | Status: DC

## 2016-06-02 NOTE — Telephone Encounter (Signed)
Patient requesting refill of Buspar to [facility].  

## 2016-06-19 ENCOUNTER — Ambulatory Visit: Payer: BLUE CROSS/BLUE SHIELD | Admitting: Family Medicine

## 2016-07-15 ENCOUNTER — Ambulatory Visit: Payer: BLUE CROSS/BLUE SHIELD | Admitting: Family Medicine

## 2016-08-05 ENCOUNTER — Other Ambulatory Visit: Payer: Self-pay

## 2016-08-05 DIAGNOSIS — E785 Hyperlipidemia, unspecified: Secondary | ICD-10-CM

## 2016-08-05 NOTE — Telephone Encounter (Signed)
Patient requesting refill of Vascepa to [facility].

## 2016-08-06 MED ORDER — VASCEPA 1 G PO CAPS: 2.0000 | ORAL_CAPSULE | Freq: Two times a day (BID) | ORAL | 0 refills | Status: DC

## 2016-08-10 ENCOUNTER — Ambulatory Visit (INDEPENDENT_AMBULATORY_CARE_PROVIDER_SITE_OTHER): Payer: BLUE CROSS/BLUE SHIELD | Admitting: Family Medicine

## 2016-08-10 ENCOUNTER — Encounter: Payer: Self-pay | Admitting: Family Medicine

## 2016-08-10 VITALS — BP 128/74 | HR 81 | Temp 98.3°F | Resp 16 | Ht 74.0 in | Wt 203.9 lb

## 2016-08-10 DIAGNOSIS — R739 Hyperglycemia, unspecified: Secondary | ICD-10-CM

## 2016-08-10 DIAGNOSIS — M545 Low back pain, unspecified: Secondary | ICD-10-CM

## 2016-08-10 DIAGNOSIS — K5909 Other constipation: Secondary | ICD-10-CM | POA: Diagnosis not present

## 2016-08-10 DIAGNOSIS — E785 Hyperlipidemia, unspecified: Secondary | ICD-10-CM | POA: Diagnosis not present

## 2016-08-10 DIAGNOSIS — F411 Generalized anxiety disorder: Secondary | ICD-10-CM

## 2016-08-10 MED ORDER — VASCEPA 1 G PO CAPS: 2.0000 | ORAL_CAPSULE | Freq: Two times a day (BID) | ORAL | 1 refills | Status: DC

## 2016-08-10 MED ORDER — BUSPIRONE HCL 5 MG PO TABS: 5.0000 mg | ORAL_TABLET | Freq: Three times a day (TID) | ORAL | 1 refills | Status: DC

## 2016-08-10 NOTE — Progress Notes (Signed)
Name: Gilbert Buchanan   MRN: [id number]    DOB: November 18, 1970   Date:08/10/2016       Progress Note  Subjective  Chief Complaint  Chief Complaint  Patient presents with  . Medication Refill    Medication Refill  . Anxiety  . Hyperlipidemia    HPI  GAD: he also has OCD, he has tried multiple medications in the past - but states he could not tolerate the side effects of medications, he is off alprazolam, on Buspar since end of 2017 and is doing well. Denies any panic attacks, states has difficulty sleeping at night, but does not want to take another medication, explained that since he takes Buspar usually twice daily, he can add another dose at night and see if it will improve symptoms.   Hyperlipidemia: he has been taking Vascepa for the past 2 years, however before last labs he was off medication for about a month and was back on it for about 2 weeks. He has changed his diet since Fall of 2016. He stopped drinking beer (  Used to drink 20-30 beers on weekends ), also changed his diet- high protein, high fiber, low carb and low sugar. His last triglycerides was at goal, HDL had improved and LDL was still low.   Obesity: he was obese since childhood. He lost a lot of weight since fall 2016, doing well, his weight has been stable now, around 200 lbs over the past few months .  Chronic Constipation: doing well with dietary modification, states Linzess was too strong.  He denies blood in stools or straining.   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 He has also  noticed some hearing loss - he saw ENT in 2017 and was given reassurance   Patient Active Problem List   Diagnosis Date Noted  . Dyslipidemia 11/11/2015  . Hyperglycemia 11/11/2015  . Chronic constipation 11/11/2015  . GAD (generalized anxiety disorder) 11/11/2015  . Prostatitis 05/05/2015  . 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 HERNIA REPAIR    . VARICOCELE EXCISION      Family History  Problem Relation Age of Onset  . Kidney disease Mother   . Testicular cancer Father   . Polycystic kidney disease    . Prostate cancer Neg Hx     Social History   Social History  . Marital status: Married    Spouse name: N/A  . Number of children: N/A  . Years of education: N/A   Occupational History  . Not on file.   Social History Main Topics  . Smoking status: Former Smoker    Years: 25.00    Types: Cigarettes, Cigars  . Smokeless tobacco: Former Systems developer    Types: Snuff     Comment: occasionally cigars  . Alcohol use 0.0 oz/week  . Drug use: No  . Sexual activity: Yes    Partners: Female   Other Topics Concern  . Not on file   Social History Narrative  .  No narrative on file     Current Outpatient Prescriptions:  .  busPIRone (BUSPAR) 5 MG tablet, Take 1 tablet (5 mg total) by mouth 3 (three) times daily., Disp: 270 tablet, Rfl: 1 .  tamsulosin (FLOMAX) 0.4 MG CAPS capsule, Take 1 capsule (0.4 mg total) by mouth daily., Disp: 90 capsule, Rfl: 3 .  VASCEPA 1 g CAPS, Take 2 capsules by mouth 2 (two) times daily., Disp: 180 capsule, Rfl: 1  Allergies  Allergen Reactions  . Acyclovir And Related      ROS  Constitutional: Negative for fever or weight change.  Respiratory: Negative for cough and shortness of breath.   Cardiovascular: Negative for chest pain or palpitations.  Gastrointestinal: Negative for abdominal pain, no bowel changes.  Musculoskeletal: Negative for gait problem or  joint swelling.  Skin: Negative for rash.  Neurological: Negative for dizziness or headache.  No other specific complaints in a complete review of systems (except as listed in HPI above).   Objective  Vitals:   08/10/16 1513  BP: 128/74  Pulse: 81  Resp: 16  Temp: 98.3 F (36.8 C)  TempSrc: Oral  SpO2: 96%  Weight: 203 lb 14.4 oz (92.5 kg)  Height: 6\' 2"  (1.88 m)    Body mass index is 26.18 kg/m.  Physical Exam  Constitutional: Patient appears well-developed and well-nourished.  No distress.  HEENT: head atraumatic, normocephalic, pupils equal and reactive to light,  neck supple, throat within normal limits Cardiovascular: Normal rate, regular rhythm and normal heart sounds.  No murmur heard. No BLE edema. Pulmonary/Chest: Effort normal and breath sounds normal. No respiratory distress. Abdominal: Soft.  There is no tenderness. Psychiatric: Patient has a normal mood and affect. behavior is normal. Judgment and thought content normal. Muscular Skeletal: negative straight leg raise, no pain during palpation of lumbar spine.   PHQ2/9: Depression screen [facility] 2/9 08/10/2016 03/19/2016 01/08/2016 11/11/2015 07/03/2015  Decreased Interest 0 0 0 0 0  Down, Depressed, Hopeless 0 0 0 0 0  PHQ - 2 Score 0 0 0 0 0     Fall Risk: Fall Risk  08/10/2016 03/19/2016 01/08/2016 11/11/2015 07/03/2015  Falls in the past year? No No No No No    Functional Status Survey: Is the patient deaf or have difficulty hearing?: No Does the patient have difficulty seeing, even when wearing glasses/contacts?: No Does the patient have difficulty concentrating, remembering, or making decisions?: No Does the patient have difficulty walking or climbing stairs?: No Does the patient have difficulty dressing or bathing?: No Does the patient have difficulty doing errands alone such as visiting a doctor's office or shopping?: No   Assessment & Plan  1. Dyslipidemia  - VASCEPA 1 g CAPS; Take 2 capsules by mouth 2  (two) times daily.  Dispense: 180 capsule; Refill: 1  2. GAD (generalized anxiety disorder)  - busPIRone (BUSPAR) 5 MG tablet; Take 1 tablet (5 mg total) by mouth 3 (three) times daily.  Dispense: 270 tablet; Refill: 1  3. Hyperglycemia  Last hgbA1C was at goal, we will recheck on his wellness visit  4. Chronic constipation  Bowel movements every three days.  5. Intermittent low back pain  Seeing chiropractor, doing much better, paresthesia of right foot resolved

## 2016-09-18 ENCOUNTER — Other Ambulatory Visit: Payer: Self-pay

## 2016-09-18 DIAGNOSIS — N401 Enlarged prostate with lower urinary tract symptoms: Principal | ICD-10-CM

## 2016-09-18 DIAGNOSIS — N138 Other obstructive and reflux uropathy: Secondary | ICD-10-CM

## 2016-09-18 MED ORDER — TAMSULOSIN HCL 0.4 MG PO CAPS: 0.4000 mg | ORAL_CAPSULE | Freq: Every day | ORAL | 3 refills | Status: DC

## 2016-10-05 DIAGNOSIS — M7121 Synovial cyst of popliteal space [Baker], right knee: Secondary | ICD-10-CM | POA: Diagnosis not present

## 2016-10-05 DIAGNOSIS — M25461 Effusion, right knee: Secondary | ICD-10-CM | POA: Diagnosis not present

## 2016-10-05 DIAGNOSIS — M79661 Pain in right lower leg: Secondary | ICD-10-CM | POA: Diagnosis not present

## 2016-11-13 DIAGNOSIS — J0100 Acute maxillary sinusitis, unspecified: Secondary | ICD-10-CM | POA: Diagnosis not present

## 2016-12-22 ENCOUNTER — Other Ambulatory Visit: Payer: Self-pay

## 2016-12-22 DIAGNOSIS — F411 Generalized anxiety disorder: Secondary | ICD-10-CM

## 2016-12-22 NOTE — Telephone Encounter (Signed)
Patient requesting refill of Buspar to [facility].

## 2016-12-25 ENCOUNTER — Encounter: Payer: Self-pay | Admitting: Family Medicine

## 2016-12-25 ENCOUNTER — Ambulatory Visit (INDEPENDENT_AMBULATORY_CARE_PROVIDER_SITE_OTHER): Payer: BLUE CROSS/BLUE SHIELD | Admitting: Family Medicine

## 2016-12-25 VITALS — BP 124/68 | HR 90 | Temp 98.1°F | Resp 18 | Wt 202.2 lb

## 2016-12-25 DIAGNOSIS — R739 Hyperglycemia, unspecified: Secondary | ICD-10-CM | POA: Diagnosis not present

## 2016-12-25 DIAGNOSIS — M545 Low back pain, unspecified: Secondary | ICD-10-CM

## 2016-12-25 DIAGNOSIS — Z79899 Other long term (current) drug therapy: Secondary | ICD-10-CM

## 2016-12-25 DIAGNOSIS — E785 Hyperlipidemia, unspecified: Secondary | ICD-10-CM | POA: Diagnosis not present

## 2016-12-25 DIAGNOSIS — G4700 Insomnia, unspecified: Secondary | ICD-10-CM | POA: Diagnosis not present

## 2016-12-25 DIAGNOSIS — F411 Generalized anxiety disorder: Secondary | ICD-10-CM | POA: Diagnosis not present

## 2016-12-25 DIAGNOSIS — M7121 Synovial cyst of popliteal space [Baker], right knee: Secondary | ICD-10-CM | POA: Diagnosis not present

## 2016-12-25 DIAGNOSIS — N401 Enlarged prostate with lower urinary tract symptoms: Secondary | ICD-10-CM | POA: Diagnosis not present

## 2016-12-25 DIAGNOSIS — N138 Other obstructive and reflux uropathy: Secondary | ICD-10-CM | POA: Diagnosis not present

## 2016-12-25 HISTORY — DX: Synovial cyst of popliteal space (Baker), right knee: M71.21

## 2016-12-25 MED ORDER — VASCEPA 1 G PO CAPS: 2.0000 | ORAL_CAPSULE | Freq: Two times a day (BID) | ORAL | 1 refills | Status: DC

## 2016-12-25 MED ORDER — TRAZODONE HCL 50 MG PO TABS: 25.0000 mg | ORAL_TABLET | Freq: Every day | ORAL | 0 refills | Status: DC

## 2016-12-25 MED ORDER — BUSPIRONE HCL 5 MG PO TABS: 5.0000 mg | ORAL_TABLET | Freq: Three times a day (TID) | ORAL | 1 refills | Status: DC

## 2016-12-25 NOTE — Progress Notes (Signed)
Name: Gilbert Buchanan   MRN: [id number]    DOB: December 20, 1970   Date:12/25/2016       Progress Note  Subjective  Chief Complaint  Chief Complaint  Patient presents with  . Anxiety  . Medication Refill    HPI  GAD: he also has OCD, he has tried multiple medications in the past - but states he could not tolerate the side effects of medications, he is off alprazolam, on Buspar since end of 2017 and is doing well. Denies any panic attacks, states has difficulty sleeping at night, he states he has difficulty relaxing, mind is always busy and always finds something to worry about it. He is willing to see psychiatrist. Denies depression or suicidal thoughts. He said he was tested for ADD in HS and Ritalin made him feel worse  Hyperlipidemia: he has been taking Vascepa for the past 2.5 years. He has changed his diet since Fall of 2016. He stopped drinking beer ( Used to drink 20-30 beers on weekends ), also changed his diet- high protein, high fiber, low carb and low sugar. His last triglycerides was at goal, HDL had improved and LDL was still elevated, we will recheck it today  Obesity: he was obese since childhood. He lost a lot of weight since fall 2016, doing well, his weight has been stable now, around 200 lbs over the past year  Chronic Constipation: doing well with dietary modification, states Linzess was too strong.  He denies blood in stools or straining. He has a bowel movement about every 3 days  Hyperglycemia: no polyphagia, polydipsia, he has some polyuria - no changes, weight is stable,  eating healthier  BPH: and history of prostatitis, seen by Urologist and is taking Flomax, he states it works well for him, he states that when off medication he has urinary frequency and urgency  Intermittent low back pain: he had severe symptoms of back pain with right lower leg radiculitis and right foot drop in 2017, he did not have MRI because of cost and went to chiropractor, he is doing well  now, occasionally has back pain, and had recurrence of symptoms went to [facility] and doppler US showed right baker's cyst.   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 He has also noticed some hearing loss - he saw ENT in 2017 and was given reassurance, he was told he has hearing loss and need to eventually have a hearing aid   Patient Active Problem List   Diagnosis Date Noted  . Dyslipidemia 11/11/2015  . Hyperglycemia 11/11/2015  . Chronic constipation 11/11/2015  . GAD (generalized anxiety disorder) 11/11/2015  . Prostatitis 05/05/2015  . 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 HERNIA REPAIR    . VARICOCELE EXCISION      Family History  Problem Relation Age of Onset  . Kidney disease Mother   . Testicular cancer Father   . Polycystic kidney disease Unknown   . Prostate cancer Neg Hx     Social History   Social History  . Marital status: Married    Spouse name: N/A  . Number of children: N/A  . Years of education: N/A   Occupational History  . Not on file.   Social History Main Topics  . Smoking status: Former Smoker    Years: 25.00    Types: Cigarettes,  Cigars  . Smokeless tobacco: Former Systems developer    Types: Snuff     Comment: occasionally cigars  . Alcohol use 0.0 oz/week  . Drug use: No  . Sexual activity: Yes    Partners: Female   Other Topics Concern  . Not on file   Social History Narrative  . No narrative on file     Current Outpatient Prescriptions:  .  busPIRone (BUSPAR) 5 MG tablet, Take 1 tablet (5 mg total) by mouth 3 (three) times daily., Disp: 270 tablet, Rfl: 1 .  tamsulosin (FLOMAX) 0.4 MG CAPS capsule, Take 1 capsule (0.4 mg total) by mouth daily., Disp: 90 capsule, Rfl: 3 .  traZODone (DESYREL) 50 MG tablet, Take 0.5-1 tablets (25-50 mg total) by mouth at bedtime., Disp: 30 tablet, Rfl:  0 .  VASCEPA 1 g CAPS, Take 2 capsules by mouth 2 (two) times daily., Disp: 180 capsule, Rfl: 1  Allergies  Allergen Reactions  . Acyclovir And Related      ROS  Constitutional: Negative for fever or weight change.  Respiratory: Negative for cough and shortness of breath.   Cardiovascular: Negative for chest pain or palpitations.  Gastrointestinal: Negative for abdominal pain, no bowel changes.  Musculoskeletal: Negative for gait problem or joint swelling.  Skin: Negative for rash.  Neurological: Negative for dizziness or headache.  No other specific complaints in a complete review of systems (except as listed in HPI above).  Objective  Vitals:   12/25/16 0814  BP: 124/68  Pulse: 90  Resp: 18  Temp: 98.1 F (36.7 C)  SpO2: 96%  Weight: 202 lb 3 oz (91.7 kg)    Body mass index is 25.96 kg/m.  Physical Exam  Constitutional: Patient appears well-developed and well-nourished.  No distress.  HEENT: head atraumatic, normocephalic, pupils equal and reactive to light,  neck supple, throat within normal limits Cardiovascular: Normal rate, regular rhythm and normal heart sounds.  No murmur heard. No BLE edema. Pulmonary/Chest: Effort normal and breath sounds normal. No respiratory distress. Abdominal: Soft.  There is no tenderness. Psychiatric: Patient has a normal mood and affect. behavior is normal. Judgment and thought content normal. Muscular Skeletal: negative straight leg raise, no pain during palpation of lumbar spine. Mild  crepitus with extension of right knee  PHQ2/9: Depression screen [facility] 2/9 08/10/2016 03/19/2016 01/08/2016 11/11/2015 07/03/2015  Decreased Interest 0 0 0 0 0  Down, Depressed, Hopeless 0 0 0 0 0  PHQ - 2 Score 0 0 0 0 0     Fall Risk: Fall Risk  08/10/2016 03/19/2016 01/08/2016 11/11/2015 07/03/2015  Falls in the past year? No No No No No     Assessment & Plan  1. Dyslipidemia  - VASCEPA 1 g CAPS; Take 2 capsules by mouth 2 (two) times daily.   Dispense: 180 capsule; Refill: 1 - Lipid panel  2. GAD (generalized anxiety disorder)  - busPIRone (BUSPAR) 5 MG tablet; Take 1 tablet (5 mg total) by mouth 3 (three) times daily.  Dispense: 270 tablet; Refill: 1 - Ambulatory referral to Psychiatry  3. Intermittent low back pain  Doing well with daily stretching exercises  4. Hyperglycemia  - Hemoglobin A1c - Insulin, fasting  5. Insomnia, unspecified type  - traZODone (DESYREL) 50 MG tablet; Take 0.5-1 tablets (25-50 mg total) by mouth at bedtime.  Dispense: 30 tablet; Refill: 0 - Ambulatory referral to Psychiatry  6. Long-term use of high-risk medication  - COMPLETE METABOLIC PANEL WITH GFR - CBC with Differential/Platelet  7. BPH with obstruction/lower urinary tract symptoms  - PSA

## 2017-03-17 DIAGNOSIS — Z23 Encounter for immunization: Secondary | ICD-10-CM | POA: Diagnosis not present

## 2017-03-26 ENCOUNTER — Other Ambulatory Visit: Payer: Self-pay | Admitting: Family Medicine

## 2017-03-26 ENCOUNTER — Encounter: Payer: BLUE CROSS/BLUE SHIELD | Admitting: Family Medicine

## 2017-03-26 DIAGNOSIS — G4700 Insomnia, unspecified: Secondary | ICD-10-CM

## 2017-03-26 MED ORDER — TRAZODONE HCL 50 MG PO TABS: 25.0000 mg | ORAL_TABLET | Freq: Every day | ORAL | 0 refills | Status: DC

## 2017-03-26 NOTE — Telephone Encounter (Signed)
Patient had an appointment this morning for physical and he no showed. He has rescheduled for 10/23 and requesting a refill on his Trazadone. I tee'd up a prescription for 4 tablets until he gets to his appointment on 10/23 if you want to refill it.

## 2017-03-26 NOTE — Telephone Encounter (Signed)
Patient is scheduled to see Dr. Ancil Boozer on 03/30/17 @ 3pm for his physical. He is requesting a refill on Trazodone 50mg .  Patient stated that the medication really works for him.  He also stated that he was only taking 1/2 the dosage.  [facility] uses [facility] in [place].

## 2017-03-30 ENCOUNTER — Encounter: Payer: Self-pay | Admitting: Family Medicine

## 2017-03-30 ENCOUNTER — Ambulatory Visit (INDEPENDENT_AMBULATORY_CARE_PROVIDER_SITE_OTHER): Payer: BLUE CROSS/BLUE SHIELD | Admitting: Family Medicine

## 2017-03-30 ENCOUNTER — Telehealth: Payer: Self-pay | Admitting: Emergency Medicine

## 2017-03-30 VITALS — BP 124/86 | HR 85 | Temp 97.9°F | Resp 16 | Ht 73.23 in | Wt 205.2 lb

## 2017-03-30 DIAGNOSIS — Z0000 Encounter for general adult medical examination without abnormal findings: Secondary | ICD-10-CM

## 2017-03-30 DIAGNOSIS — Z23 Encounter for immunization: Secondary | ICD-10-CM | POA: Diagnosis not present

## 2017-03-30 NOTE — Telephone Encounter (Signed)
Left message for patient to call office to come in for appointment earlier than 3

## 2017-03-30 NOTE — Patient Instructions (Signed)

## 2017-03-30 NOTE — Progress Notes (Signed)
Name: Gilbert Buchanan   MRN: [id number]    DOB: 12-03-1970   Date:03/30/2017       Progress Note  Subjective  Chief Complaint  Chief Complaint  Patient presents with  . Annual Exam    HPI  Male physical: he is sexually active, he denies erectile dysfunction, on flomax for the past 4 years and is doing well. Denies nocturia. Normal libido.    Patient Active Problem List   Diagnosis Date Noted  . Baker's cyst of knee, right 12/25/2016  . Dyslipidemia 11/11/2015  . Hyperglycemia 11/11/2015  . Chronic constipation 11/11/2015  . GAD (generalized anxiety disorder) 11/11/2015  . Prostatitis 05/05/2015  . 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 HERNIA REPAIR    . VARICOCELE EXCISION      Family History  Problem Relation Age of Onset  . Kidney disease Mother   . Testicular cancer Father   . Polycystic kidney disease Unknown   . Prostate cancer Neg Hx     Social History   Social History  . Marital status: Married    Spouse name: N/A  . Number of children: N/A  . Years of education: N/A   Occupational History  . Not on file.   Social History Main Topics  . Smoking status: Former Smoker    Years: 25.00    Types: Cigarettes, Cigars  . Smokeless tobacco: Former Systems developer    Types: Snuff     Comment: occasionally cigars  . Alcohol use 0.0 oz/week  . Drug use: No  . Sexual activity: Yes    Partners: Female   Other Topics Concern  . Not on file   Social History Narrative   Married, two children at home and now a foreign Forensic scientist from [place]   He work in [employer] as a Engineer, building services ( medical disposable devices)      Current Outpatient Prescriptions:  Marland Kitchen  Multiple Vitamin (MULTIVITAMIN) tablet, Take 1 tablet by mouth daily., Disp: , Rfl:  .  tamsulosin (FLOMAX) 0.4 MG CAPS capsule, Take 1 capsule (0.4 mg total) by mouth daily., Disp: 90 capsule, Rfl: 3 .  traZODone (DESYREL) 50 MG  tablet, Take 0.5-1 tablets (25-50 mg total) by mouth at bedtime., Disp: 4 tablet, Rfl: 0 .  VASCEPA 1 g CAPS, Take 2 capsules by mouth 2 (two) times daily., Disp: 180 capsule, Rfl: 1  Allergies  Allergen Reactions  . Acyclovir And Related      ROS  Constitutional: Negative for fever or weight change.  Respiratory: Negative for cough and shortness of breath.   Cardiovascular: Negative for chest pain or palpitations.  Gastrointestinal: Negative for abdominal pain, no bowel changes.  Musculoskeletal: Negative for gait problem or joint swelling.  Skin: Negative for rash.  Neurological: Negative for dizziness or headache.  No other specific complaints in a complete review of systems (except as listed in HPI above).  Objective  Vitals:   03/30/17 1440  BP: 124/86  Pulse: 85  Resp: 16  Temp: 97.9 F (36.6 C)  TempSrc: Oral  SpO2: 98%  Weight: 205 lb 3.2 oz (93.1 kg)  Height: 6' 1.23" (1.86 m)    Body mass index is 26.9 kg/m.  Physical Exam  Constitutional: Patient appears well-developed and well-nourished. No distress.  HENT: Head: Normocephalic and atraumatic. Ears: B TMs ok, no erythema or effusion; Nose: Nose normal. Mouth/Throat: Oropharynx is clear and moist. No oropharyngeal exudate.  Eyes:  Conjunctivae and EOM are normal. Pupils are equal, round, and reactive to light. No scleral icterus.  Neck: Normal range of motion. Neck supple. No JVD present. No thyromegaly present.  Cardiovascular: Normal rate, regular rhythm and normal heart sounds.  No murmur heard. No BLE edema. Pulmonary/Chest: Effort normal and breath sounds normal. No respiratory distress. Abdominal: Soft. Bowel sounds are normal, no distension. There is no tenderness. no masses MALE GENITALIA: sees urologist  RECTAL: done by Urologist  Musculoskeletal: Normal range of motion, no joint effusions. No gross deformities Neurological: he is alert and oriented to person, place, and time. No cranial nerve  deficit. Coordination, balance, strength, speech and gait are normal.  Skin: Skin is warm and dry. No rash noted. No erythema.  Psychiatric: Patient has a normal mood and affect. behavior is normal. Judgment and thought content normal.   PHQ2/9: Depression screen [facility] 2/9 03/30/2017 08/10/2016 03/19/2016 01/08/2016 11/11/2015  Decreased Interest 0 0 0 0 0  Down, Depressed, Hopeless 0 0 0 0 0  PHQ - 2 Score 0 0 0 0 0    Fall Risk: Fall Risk  03/30/2017 08/10/2016 03/19/2016 01/08/2016 11/11/2015  Falls in the past year? No No No No No    Functional Status Survey: Is the patient deaf or have difficulty hearing?: No Does the patient have difficulty seeing, even when wearing glasses/contacts?: No Does the patient have difficulty concentrating, remembering, or making decisions?: No Does the patient have difficulty walking or climbing stairs?: No Does the patient have difficulty dressing or bathing?: No Does the patient have difficulty doing errands alone such as visiting a doctor's office or shopping?: No   Current Exercise Habits: Home exercise routine, Type of exercise: strength training/weights, Time (Minutes): 10, Frequency (Times/Week): 7, Weekly Exercise (Minutes/Week): 70, Intensity: Moderate    Assessment & Plan  1. Encounter for routine history and physical exam for male  Discussed importance of 150 minutes of physical activity weekly, eat two servings of fish weekly, eat one serving of tree nuts ( cashews, pistachios, pecans, almonds.Marland Kitchen) every other day, eat 6 servings of fruit/vegetables daily and drink plenty of water and avoid sweet beverages.   2. Needs flu shot  Got at work last week.

## 2017-04-09 DIAGNOSIS — N401 Enlarged prostate with lower urinary tract symptoms: Secondary | ICD-10-CM | POA: Diagnosis not present

## 2017-04-09 DIAGNOSIS — N138 Other obstructive and reflux uropathy: Secondary | ICD-10-CM | POA: Diagnosis not present

## 2017-04-09 DIAGNOSIS — E785 Hyperlipidemia, unspecified: Secondary | ICD-10-CM | POA: Diagnosis not present

## 2017-04-09 DIAGNOSIS — R739 Hyperglycemia, unspecified: Secondary | ICD-10-CM | POA: Diagnosis not present

## 2017-04-12 LAB — COMPLETE METABOLIC PANEL WITH GFR
AG Ratio: 1.9 (calc) (ref 1.0–2.5)
ALT: 24 U/L (ref 9–46)
AST: 20 U/L (ref 10–40)
Albumin: 4.2 g/dL (ref 3.6–5.1)
Alkaline phosphatase (APISO): 37 U/L — ABNORMAL LOW (ref 40–115)
BUN: 16 mg/dL (ref 7–25)
CO2: 26 mmol/L (ref 20–32)
Calcium: 9.2 mg/dL (ref 8.6–10.3)
Chloride: 106 mmol/L (ref 98–110)
Creat: 1.09 mg/dL (ref 0.60–1.35)
GFR, Est African American: 95 mL/min/{1.73_m2} (ref >=60)
GFR, Est Non African American: 82 mL/min/{1.73_m2} (ref >=60)
Globulin: 2.2 g/dL (calc) (ref 1.9–3.7)
Glucose, Bld: 108 mg/dL — ABNORMAL HIGH (ref 65–99)
Potassium: 4 mmol/L (ref 3.5–5.3)
Sodium: 142 mmol/L (ref 135–146)
Total Bilirubin: 0.7 mg/dL (ref 0.2–1.2)
Total Protein: 6.4 g/dL (ref 6.1–8.1)

## 2017-04-12 LAB — CBC WITH DIFFERENTIAL/PLATELET
Basophils Absolute: 21 cells/uL (ref 0–200)
Basophils Relative: 0.4 %
Eosinophils Absolute: 21 cells/uL (ref 15–500)
Eosinophils Relative: 0.4 %
HCT: 41.1 % (ref 38.5–50.0)
Hemoglobin: 14.5 g/dL (ref 13.2–17.1)
Lymphs Abs: 1092 cells/uL (ref 850–3900)
MCH: 31.9 pg (ref 27.0–33.0)
MCHC: 35.3 g/dL (ref 32.0–36.0)
MCV: 90.5 fL (ref 80.0–100.0)
MPV: 10.7 fL (ref 7.5–12.5)
Monocytes Relative: 5 %
Neutro Abs: 3806 cells/uL (ref 1500–7800)
Neutrophils Relative %: 73.2 %
Platelets: 193 10*3/uL (ref 140–400)
RBC: 4.54 10*6/uL (ref 4.20–5.80)
RDW: 12 % (ref 11.0–15.0)
Total Lymphocyte: 21 %
WBC mixed population: 260 cells/uL (ref 200–950)
WBC: 5.2 10*3/uL (ref 3.8–10.8)

## 2017-04-12 LAB — LIPID PANEL
Cholesterol: 188 mg/dL (ref <200)
HDL: 48 mg/dL (ref >40)
LDL Cholesterol (Calc): 119 mg/dL (calc) — ABNORMAL HIGH
Non-HDL Cholesterol (Calc): 140 mg/dL (calc) — ABNORMAL HIGH (ref <130)
Total CHOL/HDL Ratio: 3.9 (calc) (ref <5.0)
Triglycerides: 103 mg/dL (ref <150)

## 2017-04-12 LAB — HEMOGLOBIN A1C
Hgb A1c MFr Bld: 4.9 % of total Hgb (ref <5.7)
Mean Plasma Glucose: 94 (calc)
eAG (mmol/L): 5.2 (calc)

## 2017-04-12 LAB — INSULIN, RANDOM: Insulin: 5.6 u[IU]/mL (ref 2.0–19.6)

## 2017-04-12 LAB — PSA: PSA: 0.8 ng/mL (ref <=4.0)

## 2017-04-26 ENCOUNTER — Telehealth: Payer: Self-pay | Admitting: Family Medicine

## 2017-04-26 NOTE — Telephone Encounter (Unsigned)
Copied from [place] [phone]. Topic: General - Other >> Apr 26, 2017  4:29 PM Neva Seat wrote:  Pt needs more sleep medication - current amount was only 3 pills.   Pt needs at least a 30 day supply If given more than 30 day it will be considered a maintenance Rx in which pt doesn't need to pay.  [facility] [place]

## 2017-04-27 ENCOUNTER — Other Ambulatory Visit (HOSPITAL_COMMUNITY): Payer: Self-pay

## 2017-04-27 DIAGNOSIS — G4700 Insomnia, unspecified: Secondary | ICD-10-CM

## 2017-04-27 MED ORDER — TRAZODONE HCL 50 MG PO TABS: 25.0000 mg | ORAL_TABLET | Freq: Every day | ORAL | 0 refills | Status: DC

## 2017-04-27 NOTE — Telephone Encounter (Signed)
Refill request for general medication: Trazodone  Last office visit: 03/30/2017  Last physical exam: 03/30/2017  Follow up visit: 06/28/2017

## 2017-04-27 NOTE — Telephone Encounter (Signed)
Medication has been sent to [facility] as requested.

## 2017-05-03 ENCOUNTER — Other Ambulatory Visit: Payer: BLUE CROSS/BLUE SHIELD

## 2017-05-03 ENCOUNTER — Other Ambulatory Visit: Payer: Self-pay

## 2017-05-03 DIAGNOSIS — N401 Enlarged prostate with lower urinary tract symptoms: Secondary | ICD-10-CM

## 2017-05-04 ENCOUNTER — Other Ambulatory Visit: Payer: BLUE CROSS/BLUE SHIELD

## 2017-05-04 ENCOUNTER — Encounter: Payer: Self-pay | Admitting: Urology

## 2017-05-04 DIAGNOSIS — N401 Enlarged prostate with lower urinary tract symptoms: Secondary | ICD-10-CM

## 2017-05-05 ENCOUNTER — Ambulatory Visit: Payer: BLUE CROSS/BLUE SHIELD

## 2017-05-05 LAB — PSA: Prostate Specific Ag, Serum: 1.1 ng/mL (ref 0.0–4.0)

## 2017-05-06 ENCOUNTER — Encounter: Payer: Self-pay | Admitting: Urology

## 2017-05-06 ENCOUNTER — Ambulatory Visit: Payer: BLUE CROSS/BLUE SHIELD | Admitting: Urology

## 2017-05-06 VITALS — BP 100/66 | HR 78 | Ht 74.0 in | Wt 200.0 lb

## 2017-05-06 DIAGNOSIS — Z125 Encounter for screening for malignant neoplasm of prostate: Secondary | ICD-10-CM | POA: Diagnosis not present

## 2017-05-06 DIAGNOSIS — N400 Enlarged prostate without lower urinary tract symptoms: Secondary | ICD-10-CM

## 2017-05-06 NOTE — Progress Notes (Signed)
05/06/2017 2:30 PM   [name] Sep 19, 1970 [id number]  Referring provider: Ashok Norris, MD No address on file  Chief Complaint  Patient presents with  . Benign Prostatic Hypertrophy    1YEAR     HPI: The patient is a 46 y.o. who presents for yearly follow up.  1. BPH On flomax 0.4 mg daily.   He has nocturia x0.  He empties his bladder.  He has a good stream.  He denies hesitancy.  No significant urgency.  He does notice that his symptoms worsen if he misses a dose or 2 of Flomax.  2. Prostate cancer screening The patient requests annual prostate cancer screening. No family history of prostate cancer. PSA: 1.1 in November 2017 and 1.08 April 2017. DRE benign in November 2017.  PMH: Past Medical History:  Diagnosis Date  . Anxiety   . Elevated blood pressure (not hypertension)   . History of Lyme disease   . Hyperlipidemia   . Incomplete bladder emptying   . Over weight   . Prostatitis   . Stones, prostate     Surgical History: Past Surgical History:  Procedure Laterality Date  . HERNIA REPAIR    . VARICOCELE EXCISION      Home Medications:  Allergies as of 05/06/2017      Reactions   Acyclovir And Related       Medication List        Accurate as of 05/06/17  2:30 PM. Always use your most recent med list.          buPROPion 150 MG 24 hr tablet Commonly known as:  WELLBUTRIN XL Take 150 mg by mouth daily.   multivitamin tablet Take 1 tablet by mouth daily.   tamsulosin 0.4 MG Caps capsule Commonly known as:  FLOMAX Take 1 capsule (0.4 mg total) by mouth daily.   traZODone 50 MG tablet Commonly known as:  DESYREL Take 0.5-1 tablets (25-50 mg total) at bedtime by mouth.   VASCEPA 1 g Caps Generic drug:  Icosapent Ethyl Take 2 capsules by mouth 2 (two) times daily.       Allergies:  Allergies  Allergen Reactions  . Acyclovir And Related     Family History: Family History  Problem Relation Age of Onset  . Kidney  disease Mother   . Testicular cancer Father   . Polycystic kidney disease Unknown   . Prostate cancer Neg Hx     Social History:  reports that he has quit smoking. His smoking use included cigarettes and cigars. He quit after 25.00 years of use. He has quit using smokeless tobacco. His smokeless tobacco use included snuff. He reports that he drinks alcohol. He reports that he does not use drugs.  ROS: UROLOGY Frequent Urination?: No Hard to postpone urination?: No Burning/pain with urination?: No Get up at night to urinate?: No Leakage of urine?: No Urine stream starts and stops?: No Trouble starting stream?: No Do you have to strain to urinate?: No Blood in urine?: No Urinary tract infection?: No Sexually transmitted disease?: No Injury to kidneys or bladder?: No Painful intercourse?: No Weak stream?: No Erection problems?: No Penile pain?: No  Gastrointestinal Nausea?: No Vomiting?: No Indigestion/heartburn?: No Diarrhea?: No Constipation?: No  Constitutional Fever: No Night sweats?: No Weight loss?: No Fatigue?: No  Skin Skin rash/lesions?: No Itching?: No  Eyes Blurred vision?: No Double vision?: No  Ears/Nose/Throat Sore throat?: No Sinus problems?: No  Hematologic/Lymphatic Swollen glands?: No Easy bruising?: No  Cardiovascular Leg swelling?: No Chest pain?: No  Respiratory Cough?: No Shortness of breath?: No  Endocrine Excessive thirst?: No  Musculoskeletal Back pain?: No Joint pain?: No  Neurological Headaches?: No Dizziness?: No  Psychologic Depression?: No Anxiety?: No  Physical Exam: BP 100/66   Pulse 78   Ht 6\' 2"  (1.88 m)   Wt 200 lb (90.7 kg)   BMI 25.68 kg/m   Constitutional:  Alert and oriented, No acute distress. HEENT: [place] AT, moist mucus membranes.  Trachea midline, no masses. Cardiovascular: No clubbing, cyanosis, or edema. Respiratory: Normal respiratory effort, no increased work of breathing. GI: Abdomen is  soft, nontender, nondistended, no abdominal masses GU: No CVA tenderness.  Skin: No rashes, bruises or suspicious lesions. Lymph: No cervical or inguinal adenopathy. Neurologic: Grossly intact, no focal deficits, moving all 4 extremities. Psychiatric: Normal mood and affect.  Laboratory Data: Lab Results  Component Value Date   WBC 5.2 04/09/2017   HGB 14.5 04/09/2017   HCT 41.1 04/09/2017   MCV 90.5 04/09/2017   PLT 193 04/09/2017    Lab Results  Component Value Date   CREATININE 1.09 04/09/2017    Lab Results  Component Value Date   PSA 0.8 04/09/2017    No results found for: TESTOSTERONE  Lab Results  Component Value Date   HGBA1C 4.9 04/09/2017    Urinalysis No results found for: COLORURINE, APPEARANCEUR, LABSPEC, PHURINE, GLUCOSEU, HGBUR, BILIRUBINUR, KETONESUR, PROTEINUR, UROBILINOGEN, NITRITE, LEUKOCYTESUR   Assessment & Plan:    1. BPH Continue Flomax   2. Prostate cancer screening I discussed wth the patient recommendations from the [place] for prostate cancer screening. I offered to the patient to increase his interval of PSA screening to at least every other year for the next decade or so as we discussed last year.  He is now agreeable to this.  We will hold off on checking his PSA for at least 2 years.  Return in about 1 year (around 05/06/2018).  Nickie Retort, MD  [facility] [address] [place], [place] [address] [phone]

## 2017-05-13 ENCOUNTER — Other Ambulatory Visit: Payer: Self-pay

## 2017-05-13 DIAGNOSIS — E785 Hyperlipidemia, unspecified: Secondary | ICD-10-CM

## 2017-05-13 NOTE — Telephone Encounter (Signed)
Refill request for general medication: Vascepa Oral 1Gram Capsule  Last office visit:  05/06/2017  Last physical exam:  03/30/2017  Follow up visit  06/28/2017

## 2017-05-14 MED ORDER — VASCEPA 1 G PO CAPS: 2.0000 | ORAL_CAPSULE | Freq: Two times a day (BID) | ORAL | 1 refills | Status: DC

## 2017-05-18 ENCOUNTER — Other Ambulatory Visit: Payer: Self-pay

## 2017-05-18 MED ORDER — BUPROPION HCL ER (XL) 150 MG PO TB24: 150.0000 mg | ORAL_TABLET | Freq: Every day | ORAL | 1 refills | Status: DC

## 2017-05-18 NOTE — Telephone Encounter (Signed)
Refill request for general medication: Bupropion 150 mg  Last office visit: 03/30/2017  Last physical exam: 03/30/2017  Follow up visit: 06/28/2017

## 2017-05-26 ENCOUNTER — Telehealth: Payer: Self-pay | Admitting: Family Medicine

## 2017-05-26 NOTE — Telephone Encounter (Unsigned)
Copied from [place] [id number]. Topic: General - Other >> May 26, 2017  4:29 PM Neva Seat wrote: [facility] [place] [phone]  has sent refill requests for pt since Dec. 10th.   2nd attempt at calling to request refill.  Buspirone 5 mg

## 2017-05-26 NOTE — Telephone Encounter (Signed)
Per Dr Ancil Boozer this medication is not on the patients active medication list. She send in wellbutrin to pharm but the patient has not been seen in awhile and needs an appt. Says that she did a referral to psychiatrist back in July . Did the patient go.

## 2017-06-04 ENCOUNTER — Telehealth: Payer: Self-pay

## 2017-06-04 ENCOUNTER — Telehealth: Payer: Self-pay | Admitting: Family Medicine

## 2017-06-04 ENCOUNTER — Other Ambulatory Visit: Payer: Self-pay

## 2017-06-04 DIAGNOSIS — F411 Generalized anxiety disorder: Secondary | ICD-10-CM

## 2017-06-04 NOTE — Telephone Encounter (Signed)
Medication was d/c'd in October. Will need appt to re-start.

## 2017-06-04 NOTE — Telephone Encounter (Signed)
Copied from [place] [id number]. Topic: Quick Communication - See Telephone Encounter >> Jun 04, 2017  9:42 AM Bea Graff, NT wrote: CRM for notification. See Telephone encounter for: Kennith Gain from [facility] order pharmacy calling requesting a medicine to filled for this pt. Buspar 5mg  tablets. 90 day supply. CB#: [phone]  06/04/17.

## 2017-06-04 NOTE — Telephone Encounter (Signed)
Gilbert Buchanan from pt pharmacy, [facility], called requesting refills of flomax and buspar. Refills of flomax given. Made Gilbert Buchanan aware not able to give refills of buspar as Dr. Pilar Jarvis did not prescribe it. Gilbert Buchanan voiced understanding.

## 2017-06-04 NOTE — Telephone Encounter (Signed)
Refill request for general medication: Buspar 5 mg  Last office visit: 03/30/2018  Last physical exam: 03/30/2018  Follow up:  06/28/2017

## 2017-06-07 NOTE — Telephone Encounter (Signed)
Caller name:  [facility] [place], [place] [id number] [phone] (Phone) [phone] (Fax)   Call back number: [phone]    Reason for call:  Pharmacy checking on the status of busPIRone (BUSPAR) 5 MG tablet refill, please advise

## 2017-06-07 NOTE — Telephone Encounter (Signed)
Spoke with pharmacist at [facility] and she states she confirmed with the patient he is no longer taking Buspar. But the patient is taking Wellbutrin 150 mg. Called and left a message for the patient to please call back and confirm this.

## 2017-06-10 NOTE — Telephone Encounter (Signed)
LVM for pt to call and schedule a earlier appt

## 2017-06-28 ENCOUNTER — Ambulatory Visit: Payer: BLUE CROSS/BLUE SHIELD | Admitting: Family Medicine

## 2017-06-28 ENCOUNTER — Encounter: Payer: Self-pay | Admitting: Family Medicine

## 2017-06-28 VITALS — BP 100/60 | HR 75 | Resp 14 | Ht 74.0 in | Wt 206.9 lb

## 2017-06-28 DIAGNOSIS — H833X3 Noise effects on inner ear, bilateral: Secondary | ICD-10-CM | POA: Diagnosis not present

## 2017-06-28 DIAGNOSIS — E785 Hyperlipidemia, unspecified: Secondary | ICD-10-CM | POA: Diagnosis not present

## 2017-06-28 DIAGNOSIS — N401 Enlarged prostate with lower urinary tract symptoms: Secondary | ICD-10-CM | POA: Diagnosis not present

## 2017-06-28 DIAGNOSIS — M5440 Lumbago with sciatica, unspecified side: Secondary | ICD-10-CM | POA: Diagnosis not present

## 2017-06-28 DIAGNOSIS — F411 Generalized anxiety disorder: Secondary | ICD-10-CM | POA: Diagnosis not present

## 2017-06-28 DIAGNOSIS — G4700 Insomnia, unspecified: Secondary | ICD-10-CM

## 2017-06-28 DIAGNOSIS — N138 Other obstructive and reflux uropathy: Secondary | ICD-10-CM | POA: Diagnosis not present

## 2017-06-28 DIAGNOSIS — R739 Hyperglycemia, unspecified: Secondary | ICD-10-CM

## 2017-06-28 HISTORY — DX: Noise effects on inner ear, bilateral: H83.3X3

## 2017-06-28 MED ORDER — TRAZODONE HCL 50 MG PO TABS
25.0000 mg | ORAL_TABLET | Freq: Every day | ORAL | 1 refills | Status: DC
Start: 2017-06-28 — End: 2018-05-02

## 2017-06-28 MED ORDER — TAMSULOSIN HCL 0.4 MG PO CAPS: 0.4000 mg | ORAL_CAPSULE | Freq: Every day | ORAL | 3 refills | Status: DC

## 2017-06-28 NOTE — Progress Notes (Signed)
Name: Gilbert Buchanan   MRN: [id number]    DOB: [birth year]   Date:06/28/2017       Progress Note  Subjective  Chief Complaint  Chief Complaint  Patient presents with  . Medication Refill  . dyslipidemia    HPI  GAD: he also has OCD, he has tried multiple medications in the past - but states he could not tolerate the side effects of medications, he is off alprazolam, on Buspar since end of 2017 and is doing well. Denies any panic attacks, states has difficulty sleeping at night and is taking Trazodone half pill prn, he states he has difficulty relaxing, mind is always busy and always finds something to worry about it, sometimes worries about having to get up early to go to work.  Denies depression or suicidal thoughts. He said he was tested for ADD in HS and Ritalin made him feel worse. We referred him to psychiatrist, but they called him during the work hour and he could not schedule appointment.   Hyperlipidemia: he has been taking Vascepa for the past 2.5 years. He has changed his diet since Fall of 2016. He stopped drinking beer ( Used to drink 20-30 beers on weekends ), also changed his diet- high protein, high fiber, low carb and low sugar. His last triglycerides was at goal, HDL had improved significantly, and LDL was still elevated, continue current management   Obesity: he was obese since childhood. He lost a lot of weight since fall 2016, doing well, his weight has been stable now, around 200 lbs over the past year. He states gained a little bit since last visit, he states he will resume healthier diet again   Chronic Constipation: doing well with dietary modification, states Linzess was too strong. He denies blood in stools or straining. He has a bowel movement about every 3 da-4 days  Hyperglycemia: no polyphagia, polydipsia, he has some polyuria - no changes, weight is stable,  eating healthier  BPH: and history of prostatitis, seen by Urologist and is taking Flomax, he  states it works well for him, he states that when off medication he has urinary frequency and urgency, he saw Dr. Pilar Jarvis last Fall and needs refills today.  Intermittent low back pain: he had severe symptoms of back pain with right lower leg radiculitis and right foot drop in 2017, he did not have MRI because of cost and went to chiropractor, he is doing well now, occasionally has back pain, and had recurrence of symptoms went to [facility] and doppler US showed right baker's cyst. He states depending on the activity it flares, so he does some stretch exercises at home and is doing well .   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 He has also noticed some hearing loss - he saw ENT in 2017 and was given reassurance, he was told he had hearing loss, he went to  [facility] ear, he has 20 % hearing loss on the right ear and 18% on left ear, range 800 -1200 hz  Patient Active Problem List   Diagnosis Date Noted  . Baker's cyst of knee, right 12/25/2016  . Dyslipidemia 11/11/2015  . Hyperglycemia 11/11/2015  . Chronic constipation 11/11/2015  . GAD (generalized anxiety disorder) 11/11/2015  . Prostatitis 05/05/2015  . 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 HERNIA REPAIR    .  VARICOCELE EXCISION      Family History  Problem Relation Age of Onset  . Kidney disease Mother   . Testicular cancer Father   . Polycystic kidney disease Unknown   . Prostate cancer Neg Hx     Social History   Socioeconomic History  . Marital status: Married    Spouse name: Not on file  . Number of children: 2  . Years of education: Not on file  . Highest education level: Bachelor's degree (e.g., BA, AB, BS)  Social Needs  . Financial resource strain: Not on file  . Food insecurity - worry: Not on file  . Food insecurity - inability: Not on file  .  Transportation needs - medical: Not on file  . Transportation needs - non-medical: Not on file  Occupational History  . Occupation: Engineer, building services   Tobacco Use  . Smoking status: Former Smoker    Years: 25.00    Types: Cigarettes, Cigars  . Smokeless tobacco: Former Systems developer    Types: Snuff  . Tobacco comment: occasionally cigars  Substance and Sexual Activity  . Alcohol use: Yes    Alcohol/week: 0.0 oz  . Drug use: No  . Sexual activity: Yes    Partners: Female  Other Topics Concern  . Not on file  Social History Narrative   Married, two children at home and now a foreign Forensic scientist from [place]   He work in [employer] as a Engineer, building services ( medical disposable devices)      Current Outpatient Medications:  .  buPROPion (WELLBUTRIN XL) 150 MG 24 hr tablet, Take 1 tablet (150 mg total) by mouth daily., Disp: 90 tablet, Rfl: 1 .  Multiple Vitamin (MULTIVITAMIN) tablet, Take 1 tablet by mouth daily., Disp: , Rfl:  .  tamsulosin (FLOMAX) 0.4 MG CAPS capsule, Take 1 capsule (0.4 mg total) by mouth daily., Disp: 90 capsule, Rfl: 3 .  traZODone (DESYREL) 50 MG tablet, Take 0.5-1 tablets (25-50 mg total) by mouth at bedtime., Disp: 90 tablet, Rfl: 1 .  VASCEPA 1 g CAPS, Take 2 capsules by mouth 2 (two) times daily., Disp: 360 capsule, Rfl: 1  Allergies  Allergen Reactions  . Acyclovir And Related      ROS  Constitutional: Negative for fever or weight change.  Respiratory: Negative for cough and shortness of breath.   Cardiovascular: Negative for chest pain or palpitations.  Gastrointestinal: Negative for abdominal pain, no bowel changes.  Musculoskeletal: Negative for gait problem or joint swelling.  Skin: Negative for rash.  Neurological: Negative for dizziness or headache.  No other specific complaints in a complete review of systems (except as listed in HPI above).   Objective  Vitals:   06/28/17 0753  BP: 100/60  Pulse: 75  Resp: 14  SpO2: 98%  Weight: 206 lb 14.4  oz (93.8 kg)  Height: 6\' 2"  (1.88 m)    Body mass index is 26.56 kg/m.  Physical Exam  Constitutional: Patient appears well-developed and well-nourished. Obese  No distress.  HEENT: head atraumatic, normocephalic, pupils equal and reactive to light, neck supple, throat within normal limits Cardiovascular: Normal rate, regular rhythm and normal heart sounds.  No murmur heard. No BLE edema. Pulmonary/Chest: Effort normal and breath sounds normal. No respiratory distress. Abdominal: Soft.  There is no tenderness. Psychiatric: Patient has a normal mood and affect. behavior is normal. Judgment and thought content normal. Muscular Skeletal: no pain during palpation of lumbar spine.     Recent Results (from the past  2160 hour(s))  COMPLETE METABOLIC PANEL WITH GFR     Status: Abnormal   Collection Time: 04/09/17 10:00 AM  Result Value Ref Range   Glucose, Bld 108 (H) 65 - 99 mg/dL    Comment: .            Fasting reference interval . For someone without known diabetes, a glucose value between 100 and 125 mg/dL is consistent with prediabetes and should be confirmed with a follow-up test. .    BUN 16 7 - 25 mg/dL   Creat 1.09 0.60 - 1.35 mg/dL   GFR, Est Non African American 82 > OR = 60 mL/min/1.43m2   GFR, Est African American 95 > OR = 60 mL/min/1.92m2   BUN/Creatinine Ratio NOT APPLICABLE 6 - 22 (calc)   Sodium 142 135 - 146 mmol/L   Potassium 4.0 3.5 - 5.3 mmol/L   Chloride 106 98 - 110 mmol/L   CO2 26 20 - 32 mmol/L   Calcium 9.2 8.6 - 10.3 mg/dL   Total Protein 6.4 6.1 - 8.1 g/dL   Albumin 4.2 3.6 - 5.1 g/dL   Globulin 2.2 1.9 - 3.7 g/dL (calc)   AG Ratio 1.9 1.0 - 2.5 (calc)   Total Bilirubin 0.7 0.2 - 1.2 mg/dL   Alkaline phosphatase (APISO) 37 (L) 40 - 115 U/L   AST 20 10 - 40 U/L   ALT 24 9 - 46 U/L  Hemoglobin A1c     Status: None   Collection Time: 04/09/17 10:00 AM  Result Value Ref Range   Hgb A1c MFr Bld 4.9 <5.7 % of total Hgb    Comment: For the purpose  of screening for the presence of diabetes: . <5.7%       Consistent with the absence of diabetes 5.7-6.4%    Consistent with increased risk for diabetes             (prediabetes) > or =6.5%  Consistent with diabetes . This assay result is consistent with a decreased risk of diabetes. . Currently, no consensus exists regarding use of hemoglobin A1c for diagnosis of diabetes in children. . According to American Diabetes Association (ADA) guidelines, hemoglobin A1c <7.0% represents optimal control in non-pregnant diabetic patients. Different metrics may apply to specific patient populations.  Standards of Medical Care in Diabetes(ADA). .    Mean Plasma Glucose 94 (calc)   eAG (mmol/L) 5.2 (calc)  Lipid panel     Status: Abnormal   Collection Time: 04/09/17 10:00 AM  Result Value Ref Range   Cholesterol 188 <200 mg/dL   HDL 48 >40 mg/dL   Triglycerides 103 <150 mg/dL   LDL Cholesterol (Calc) 119 (H) mg/dL (calc)    Comment: Reference range: <100 . Desirable range <100 mg/dL for primary prevention;   <70 mg/dL for patients with CHD or diabetic patients  with > or = 2 CHD risk factors. Marland Kitchen LDL-C is now calculated using the Martin-Hopkins  calculation, which is a validated novel method providing  better accuracy than the Friedewald equation in the  estimation of LDL-C.  Cresenciano Genre et al. Annamaria Helling. 5462;703(50): 2061-2068  (http://education.[facility].com/faq/FAQ164)    Total CHOL/HDL Ratio 3.9 <5.0 (calc)   Non-HDL Cholesterol (Calc) 140 (H) <130 mg/dL (calc)    Comment: For patients with diabetes plus 1 major ASCVD risk  factor, treating to a non-HDL-C goal of <100 mg/dL  (LDL-C of <70 mg/dL) is considered a therapeutic  option.   CBC with Differential/Platelet     Status: None  Collection Time: 04/09/17 10:00 AM  Result Value Ref Range   WBC 5.2 3.8 - 10.8 Thousand/uL   RBC 4.54 4.20 - 5.80 Million/uL   Hemoglobin 14.5 13.2 - 17.1 g/dL   HCT 41.1 38.5 - 50.0 %    MCV 90.5 80.0 - 100.0 fL   MCH 31.9 27.0 - 33.0 pg   MCHC 35.3 32.0 - 36.0 g/dL   RDW 12.0 11.0 - 15.0 %   Platelets 193 140 - 400 Thousand/uL   MPV 10.7 7.5 - 12.5 fL   Neutro Abs 3,806 1,500 - 7,800 cells/uL   Lymphs Abs 1,092 850 - 3,900 cells/uL   WBC mixed population 260 200 - 950 cells/uL   Eosinophils Absolute 21 15 - 500 cells/uL   Basophils Absolute 21 0 - 200 cells/uL   Neutrophils Relative % 73.2 %   Total Lymphocyte 21.0 %   Monocytes Relative 5.0 %   Eosinophils Relative 0.4 %   Basophils Relative 0.4 %  PSA     Status: None   Collection Time: 04/09/17 10:00 AM  Result Value Ref Range   PSA 0.8 < OR = 4.0 ng/mL    Comment: The total PSA value from this assay system is  standardized against the WHO standard. The test  result will be approximately 20% lower when compared  to the equimolar-standardized total PSA (Beckman  Coulter). Comparison of serial PSA results should be  interpreted with this fact in mind. . This test was performed using the Siemens  chemiluminescent method. Values obtained from  different assay methods cannot be used interchangeably. PSA levels, regardless of value, should not be interpreted as absolute evidence of the presence or absence of disease.   Insulin, random     Status: None   Collection Time: 04/09/17 10:00 AM  Result Value Ref Range   Insulin 5.6 2.0 - 19.6 uIU/mL    Comment: This insulin assay shows strong cross-reactivity for some insulin analogs (lispro, aspart, and glargine) and much lower cross-reactivity with others (detemir, glulisine).   PSA     Status: None   Collection Time: 05/04/17  4:38 PM  Result Value Ref Range   Prostate Specific Ag, Serum 1.1 0.0 - 4.0 ng/mL    Comment: Roche ECLIA methodology. According to the American Urological Association, Serum PSA should decrease and remain at undetectable levels after radical prostatectomy. The AUA defines biochemical recurrence as an initial PSA value 0.2 ng/mL or  greater followed by a subsequent confirmatory PSA value 0.2 ng/mL or greater. Values obtained with different assay methods or kits cannot be used interchangeably. Results cannot be interpreted as absolute evidence of the presence or absence of malignant disease.     PHQ2/9: Depression screen [facility] 2/9 03/30/2017 08/10/2016 03/19/2016 01/08/2016 11/11/2015  Decreased Interest 0 0 0 0 0  Down, Depressed, Hopeless 0 0 0 0 0  PHQ - 2 Score 0 0 0 0 0     Fall Risk: Fall Risk  06/28/2017 03/30/2017 08/10/2016 03/19/2016 01/08/2016  Falls in the past year? No No No No No     Functional Status Survey: Is the patient deaf or have difficulty hearing?: No Does the patient have difficulty seeing, even when wearing glasses/contacts?: No Does the patient have difficulty concentrating, remembering, or making decisions?: No Does the patient have difficulty walking or climbing stairs?: No Does the patient have difficulty dressing or bathing?: No Does the patient have difficulty doing errands alone such as visiting a doctor's office or shopping?: No  Assessment & Plan  1. Insomnia, unspecified type  - traZODone (DESYREL) 50 MG tablet; Take 0.5-1 tablets (25-50 mg total) by mouth at bedtime.  Dispense: 90 tablet; Refill: 1  2. BPH with obstruction/lower urinary tract symptoms  - tamsulosin (FLOMAX) 0.4 MG CAPS capsule; Take 1 capsule (0.4 mg total) by mouth daily.  Dispense: 90 capsule; Refill: 3  3. Hyperglycemia  Normal fasting insulin and also normal hgbA1C, discussed low sugar diet  4. GAD (generalized anxiety disorder)  Stable on medication   5. Dyslipidemia  Continue medication, she still has resolved   6. 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

## 2017-07-26 ENCOUNTER — Other Ambulatory Visit: Payer: Self-pay

## 2017-07-26 DIAGNOSIS — E785 Hyperlipidemia, unspecified: Secondary | ICD-10-CM

## 2017-07-26 MED ORDER — VASCEPA 1 G PO CAPS: 2.0000 | ORAL_CAPSULE | Freq: Two times a day (BID) | ORAL | 1 refills | Status: DC

## 2017-07-26 NOTE — Telephone Encounter (Signed)
Refill Request for Cholesterol medication. Vascepa 1 gm  Last physical: 03/30/2017  Follow up Visit: 12/27/2017  Lab Results  Component Value Date   CHOL 188 04/09/2017   HDL 48 04/09/2017   LDLCALC 153 (H) 11/15/2015   TRIG 103 04/09/2017   CHOLHDL 3.9 04/09/2017

## 2017-08-24 ENCOUNTER — Other Ambulatory Visit: Payer: Self-pay | Admitting: Nurse Practitioner

## 2017-08-24 ENCOUNTER — Ambulatory Visit: Payer: BLUE CROSS/BLUE SHIELD | Admitting: Family Medicine

## 2017-08-24 ENCOUNTER — Encounter: Payer: Self-pay | Admitting: Family Medicine

## 2017-08-24 VITALS — BP 118/62 | HR 79 | Temp 97.8°F | Resp 18 | Ht 74.0 in | Wt 209.1 lb

## 2017-08-24 DIAGNOSIS — R221 Localized swelling, mass and lump, neck: Secondary | ICD-10-CM

## 2017-08-24 DIAGNOSIS — R070 Pain in throat: Secondary | ICD-10-CM

## 2017-08-24 MED ORDER — FLUTICASONE PROPIONATE 50 MCG/ACT NA SUSP: 2.0000 | Freq: Every day | NASAL | 1 refills | Status: DC

## 2017-08-24 MED ORDER — FEXOFENADINE HCL 180 MG PO TABS: 180.0000 mg | ORAL_TABLET | Freq: Every day | ORAL | 1 refills | Status: DC

## 2017-08-24 NOTE — Progress Notes (Signed)
Name: Gilbert Buchanan   MRN: [id number]    DOB: 08-10-1970   Date:08/24/2017       Progress Note  Subjective  Chief Complaint  Chief Complaint  Patient presents with  . Sore Throat    throat has a itch for 1 month    HPI  Pt presents with the following concerns:  Throat discomfort: Notes "scratchy" throat for about a month - feels like his tonsils are touching the back of his throat.  He notes as a child he would have to have a procedure done to remove tonsil stones and wonders if this is what this is again. Denies nasal congestion/rhinorrhea, cough, shortness of breath, chest pain, otalgia.  Lymph Node: RIGHT side of neck - has noticed this a couple of months ago.  Not painful, about the size of a BB, says it is mobile.  He notes he lost about 60lbs recently and thinks he may have just noticed it now that he does not have as much adipose tissue.  He is a former 20 year cigarette smoker and has used a Emergency planning/management officer Water engineer) for a few years now.    Patient Active Problem List   Diagnosis Date Noted  . 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 Baker's cyst of knee, right 12/25/2016  . Dyslipidemia 11/11/2015  . Hyperglycemia 11/11/2015  . Chronic constipation 11/11/2015  . GAD (generalized anxiety disorder) 11/11/2015  . Prostatitis 05/05/2015  . BPH with obstruction/lower urinary tract symptoms 05/05/2015    Social History   Tobacco Use  . Smoking status: Former Smoker    Years: 25.00    Types: Cigarettes, Cigars  . Smokeless tobacco: Former Systems developer    Types: Snuff  . Tobacco comment: occasionally cigars  Substance Use Topics  . Alcohol use: Yes    Alcohol/week: 0.0 oz     Current Outpatient Medications:  .  buPROPion (WELLBUTRIN XL) 150 MG 24 hr tablet, Take 1 tablet (150 mg total) by mouth daily., Disp: 90 tablet, Rfl: 1 .  Multiple Vitamin (MULTIVITAMIN) tablet, Take 1 tablet by mouth daily., Disp: , Rfl:  .  tamsulosin (FLOMAX) 0.4 MG CAPS capsule, Take 1  capsule (0.4 mg total) by mouth daily., Disp: 90 capsule, Rfl: 3 .  traZODone (DESYREL) 50 MG tablet, Take 0.5-1 tablets (25-50 mg total) by mouth at bedtime., Disp: 90 tablet, Rfl: 1 .  VASCEPA 1 g CAPS, Take 2 capsules by mouth 2 (two) times daily., Disp: 360 capsule, Rfl: 1  Allergies  Allergen Reactions  . Acyclovir And Related     ROS Constitutional: Negative for fever or weight change.  Respiratory: Negative for cough and shortness of breath.   Cardiovascular: Negative for chest pain or palpitations.  Gastrointestinal: Negative for abdominal pain, no bowel changes.  Musculoskeletal: Negative for gait problem or joint swelling.  Skin: Negative for rash.  Neurological: Negative for dizziness or headache.  No other specific complaints in a complete review of systems (except as listed in HPI above).  Objective  Vitals:   08/24/17 0802  BP: 118/62  Pulse: 79  Resp: 18  Temp: 97.8 F (36.6 C)  TempSrc: Oral  SpO2: 96%  Weight: 209 lb 1.6 oz (94.8 kg)  Height: 6\' 2"  (1.88 m)   Body mass index is 26.85 kg/m.  Nursing Note and Vital Signs reviewed.  Physical Exam  Neck:      Constitutional: Patient appears well-developed and well-nourished.  No distress.  HEENT: head atraumatic, normocephalic, pupils equal and  reactive to light, EOM's intact, TM's without erythema or bulging, no maxillary or frontal sinus tenderness, neck supple without single bb-sized firm nodule on the right lower lateral neck as above - area is firm and non-tender, nodule is mobile; oropharynx mildly erythematous and moist without exudate, mild cobblestoning present. Cardiovascular: Normal rate, regular rhythm, S1/S2 present.  No murmur or rub heard. No BLE edema. Pulmonary/Chest: Effort normal and breath sounds clear. No respiratory distress or retractions. Psychiatric: Patient has a normal mood and affect. behavior is normal. Judgment and thought content normal.  No results found for this or any  previous visit (from the past 72 hour(s)).  Assessment & Plan  1. Nodule of neck - Discussed option of monitoring vs ENT, pt prefers to see ENT at this time.  I recommend that he avoid touching/pressing the area as this may exacerbate symptoms. - Ambulatory referral to ENT  2. Throat discomfort - Pt would also like to see ENT regarding his throat discomfort - advised I do not see any tonsil stones, and I recommend the below medications while he awaits ENT referral. - Ambulatory referral to ENT - fluticasone (FLONASE) 50 MCG/ACT nasal spray; Place 2 sprays into both nostrils daily.  Dispense: 32 g; Refill: 1 - fexofenadine (ALLEGRA ALLERGY) 180 MG tablet; Take 1 tablet (180 mg total) by mouth daily.  Dispense: 90 tablet; Refill: 1

## 2017-08-30 DIAGNOSIS — J069 Acute upper respiratory infection, unspecified: Secondary | ICD-10-CM | POA: Diagnosis not present

## 2017-10-06 DIAGNOSIS — R221 Localized swelling, mass and lump, neck: Secondary | ICD-10-CM | POA: Diagnosis not present

## 2017-10-06 DIAGNOSIS — R070 Pain in throat: Secondary | ICD-10-CM | POA: Diagnosis not present

## 2017-12-20 ENCOUNTER — Other Ambulatory Visit: Payer: Self-pay | Admitting: Family Medicine

## 2017-12-20 DIAGNOSIS — N401 Enlarged prostate with lower urinary tract symptoms: Principal | ICD-10-CM

## 2017-12-20 DIAGNOSIS — N138 Other obstructive and reflux uropathy: Secondary | ICD-10-CM

## 2017-12-20 NOTE — Telephone Encounter (Signed)
Copied from [place] [id number]. Topic: Quick Communication - Rx Refill/Question >> Dec 20, 2017 11:02 AM Nils Flack wrote: Medication: tamsulosin (FLOMAX) 0.4 MG CAPS capsule 90 day supply  Has the patient contacted their pharmacy? No. (Agent: If no, request that the patient contact the pharmacy for the refill.) (Agent: If yes, when and what did the pharmacy advise?)  Preferred Pharmacy (with phone number or street name): [facility]  Pt having problems with mail order company   Agent: Please be advised that RX refills may take up to 3 business days. We ask that you follow-up with your pharmacy.

## 2017-12-20 NOTE — Telephone Encounter (Signed)
Refill of Flomax  LRF 06/28/17  #90  3 refills  LOV 08/24/17 E. Boyce  [facility]

## 2017-12-20 NOTE — Telephone Encounter (Signed)
Refill request was sent to Dr. Krichna Sowles for approval and submission.  

## 2017-12-22 NOTE — Telephone Encounter (Signed)
Patients wife called to follow up on previous request for refill.

## 2017-12-27 ENCOUNTER — Ambulatory Visit: Payer: BLUE CROSS/BLUE SHIELD | Admitting: Family Medicine

## 2017-12-30 ENCOUNTER — Ambulatory Visit (INDEPENDENT_AMBULATORY_CARE_PROVIDER_SITE_OTHER): Payer: BLUE CROSS/BLUE SHIELD | Admitting: Family Medicine

## 2017-12-30 ENCOUNTER — Encounter: Payer: Self-pay | Admitting: Family Medicine

## 2017-12-30 VITALS — BP 110/74 | HR 75 | Temp 98.0°F | Resp 16 | Ht 74.0 in | Wt 208.6 lb

## 2017-12-30 DIAGNOSIS — K5909 Other constipation: Secondary | ICD-10-CM

## 2017-12-30 DIAGNOSIS — M545 Low back pain, unspecified: Secondary | ICD-10-CM

## 2017-12-30 DIAGNOSIS — N138 Other obstructive and reflux uropathy: Secondary | ICD-10-CM

## 2017-12-30 DIAGNOSIS — F411 Generalized anxiety disorder: Secondary | ICD-10-CM | POA: Diagnosis not present

## 2017-12-30 DIAGNOSIS — E785 Hyperlipidemia, unspecified: Secondary | ICD-10-CM | POA: Diagnosis not present

## 2017-12-30 DIAGNOSIS — R739 Hyperglycemia, unspecified: Secondary | ICD-10-CM | POA: Diagnosis not present

## 2017-12-30 DIAGNOSIS — N401 Enlarged prostate with lower urinary tract symptoms: Secondary | ICD-10-CM

## 2017-12-30 MED ORDER — VASCEPA 1 G PO CAPS: 2.0000 | ORAL_CAPSULE | Freq: Two times a day (BID) | ORAL | 1 refills | Status: DC

## 2017-12-30 MED ORDER — BUPROPION HCL ER (XL) 150 MG PO TB24: 150.0000 mg | ORAL_TABLET | Freq: Every day | ORAL | 1 refills | Status: DC

## 2017-12-30 NOTE — Progress Notes (Signed)
Name: Gilbert Buchanan   MRN: [id number]    DOB: 05/30/1971   Date:12/30/2017       Progress Note  Subjective  Chief Complaint  Chief Complaint  Patient presents with  . Follow-up    6 month f/u  . Anxiety  . Hyperlipidemia  . Obesity  . Constipation  . Benign Prostatic Hypertrophy  . Hyperglycemia  . Back Pain  . Tinnitus  . Medication Refill    flomax, flonase    HPI  GAD: he also has OCD, and history of ADD as a child ( but could not tolerate medication) he has tried multiple medications in the past - but states he could not tolerate the side effects of medications, he is off alprazolam, on Buspar since end of 2017 and is doing well. Denies any panic attacks, states has difficulty sleeping at night and is taking Trazodone half pill prn, he states he has difficulty relaxing, mind is always busy and always finds something to worry about it.  Denies depression or suicidal thoughts. He said he was tested for ADD in HS and Ritalin made him feel worse. He does not want to see psychiatrist. He states he is stable and fine. He changed jobs but is tolerating it well.   Hyperlipidemia: he has been taking Vascepa for the past 3 years. He has changed his diet since Fall of 2016. He stopped drinking beer ( Used to drink 20-30 beers on weekends ), also changed his diet- high protein, high fiber, low carb and low sugar. His last triglycerides was at goal, HDL had improved significantly, and LDL was still elevated, continue current management We will recheck yearly  Obesity: he was obese since childhood. He lost a lot of weight since fall 2016, doing well, his weight has been stable now, around 208 lbs over the past 6 months, he was 200 lbs for about year but feels better at 208 lbs range ( at our office) at home his weight is around 200 lbs without steel toe shoes  Chronic Constipation: doing well with dietary modification, states Linzess was too strong. He denies blood in stools or straining.  He has a bowel movement about every 3 da-4 days, feeling well, Bristol scale of 4   Hyperglycemia: no polyphagia, polydipsia, he has some polyuria - no changes, weight is stable, he eats healthy snacks, very seldom has M&M's   BPH: and history of prostatitis, seen by Urologist and is taking Flomax, he states it works well for him, he states that when off medication he has urinary frequency and urgency, he saw Dr. Pilar Jarvis Fall 2017 and is doing well on medication.   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 He has also noticed some hearing loss - he saw ENT in 2017 and was given reassurance, he was told he had hearing loss. He has 20 % hearing loss on the right ear and 18% on left ear, range 800 -1200 hz  He got hearing aids , it worked well but he took it back because at his job he needs to remove hearing aids constantly    Patient Active Problem List   Diagnosis  Date Noted  . 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 Baker's cyst of knee, right 12/25/2016  . Dyslipidemia 11/11/2015  . Hyperglycemia 11/11/2015  . Chronic constipation 11/11/2015  . GAD (generalized anxiety disorder) 11/11/2015  . Prostatitis 05/05/2015  . 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 HERNIA REPAIR    . VARICOCELE EXCISION      Family History  Problem Relation Age of Onset  . Kidney disease Mother   . Testicular cancer Father   . Polycystic kidney disease Unknown   . Prostate cancer Neg Hx     Social History   Socioeconomic History  . Marital status: Married    Spouse name: Angelique  . Number  of children: 2  . Years of education: Not on file  . Highest education level: Bachelor's degree (e.g., BA, AB, BS)  Occupational History  . Occupation: Engineer, building services   Social Needs  . Financial resource strain: Patient refused  . Food insecurity:    Worry: Patient refused    Inability: Patient refused  . Transportation needs:    Medical: Patient refused    Non-medical: Patient refused  Tobacco Use  . Smoking status: Former Smoker    Years: 25.00    Types: Cigarettes, Cigars  . Smokeless tobacco: Former Systems developer    Types: Snuff  . Tobacco comment: occasionally cigars  Substance and Sexual Activity  . Alcohol use: Yes    Alcohol/week: 0.0 oz  . Drug use: No  . Sexual activity: Yes    Partners: Female  Lifestyle  . Physical activity:    Days per week: Patient refused    Minutes per session: Patient refused  . Stress: Patient refused  Relationships  . Social connections:    Talks on phone: Patient refused    Gets together: Patient refused    Attends religious service: Patient refused    Active member of club or organization: Patient refused    Attends meetings of clubs or organizations: Patient refused    Relationship status: Patient refused  . Intimate partner violence:    Fear of current or ex partner: Patient refused    Emotionally abused: Patient refused    Physically abused: Patient refused    Forced sexual activity: Patient refused  Other Topics Concern  . Not on file  Social History Narrative   Married, two children at home and now a foreign Forensic scientist from [place]   He work in [employer] now , Engineer, building services      Current Outpatient Medications:  .  buPROPion (WELLBUTRIN XL) 150 MG 24 hr tablet, Take 1 tablet (150 mg total) by mouth daily., Disp: 90 tablet, Rfl: 1 .  fluticasone (FLONASE) 50 MCG/ACT nasal spray, Place 2 sprays into both nostrils daily., Disp: 32 g, Rfl: 1 .  Multiple Vitamin (MULTIVITAMIN) tablet, Take 1 tablet by mouth daily., Disp: , Rfl:   .  tamsulosin (FLOMAX) 0.4 MG CAPS capsule, Take 1 capsule (0.4 mg total) by mouth daily., Disp: 90 capsule, Rfl: 3 .  VASCEPA 1 g CAPS, Take 2 capsules by mouth 2 (two) times daily., Disp: 360 capsule, Rfl: 1 .  fexofenadine (ALLEGRA ALLERGY) 180 MG tablet, Take 1 tablet (180 mg total) by mouth daily. (Patient not taking: Reported on 12/30/2017), Disp: 90 tablet, Rfl: 1 .  traZODone (DESYREL) 50 MG tablet, Take 0.5-1 tablets (25-50 mg total) by mouth at bedtime. (Patient not taking: Reported on 12/30/2017),  Disp: 90 tablet, Rfl: 1  Allergies  Allergen Reactions  . Acyclovir And Related      ROS  Constitutional: Negative for fever or weight change.  Respiratory: Negative for cough and shortness of breath.   Cardiovascular: Negative for chest pain or palpitations.  Gastrointestinal: Negative for abdominal pain, no bowel changes.  Musculoskeletal: Negative for gait problem or joint swelling.  Skin: Negative for rash.  Neurological: Negative for dizziness or headache.  No other specific complaints in a complete review of systems (except as listed in HPI above).  Objective  Vitals:   12/30/17 0748  BP: 110/74  Pulse: 75  Resp: 16  Temp: 98 F (36.7 C)  TempSrc: Oral  SpO2: 97%  Weight: 208 lb 9.6 oz (94.6 kg)  Height: 6\' 2"  (1.88 m)    Body mass index is 26.78 kg/m.  Physical Exam  Constitutional: Patient appears well-developed and well-nourished. Overweight.  No distress.  HEENT: head atraumatic, normocephalic, pupils equal and reactive to light,  neck supple, throat within normal limits Cardiovascular: Normal rate, regular rhythm and normal heart sounds.  No murmur heard. No BLE edema. Pulmonary/Chest: Effort normal and breath sounds normal. No respiratory distress. Abdominal: Soft.  There is no tenderness. Psychiatric: Patient has a normal mood and affect. behavior is normal. Judgment and thought content normal.  PHQ2/9: Depression screen [facility] 2/9 12/30/2017 03/30/2017  08/10/2016 03/19/2016 01/08/2016  Decreased Interest 0 0 0 0 0  Down, Depressed, Hopeless 0 0 0 0 0  PHQ - 2 Score 0 0 0 0 0  Altered sleeping 0 - - - -  Tired, decreased energy 0 - - - -  Change in appetite 0 - - - -  Feeling bad or failure about yourself  0 - - - -  Trouble concentrating 0 - - - -  Moving slowly or fidgety/restless 0 - - - -  Suicidal thoughts 0 - - - -  PHQ-9 Score 0 - - - -     Fall Risk: Fall Risk  12/30/2017 06/28/2017 03/30/2017 08/10/2016 03/19/2016  Falls in the past year? No No No No No     Functional Status Survey: Is the patient deaf or have difficulty hearing?: No Does the patient have difficulty seeing, even when wearing glasses/contacts?: No Does the patient have difficulty concentrating, remembering, or making decisions?: No Does the patient have difficulty walking or climbing stairs?: No Does the patient have difficulty dressing or bathing?: No Does the patient have difficulty doing errands alone such as visiting a doctor's office or shopping?: No   Assessment & Plan  1. Hyperglycemia  Weight has been stable, gained since last year, but feels good now  2. Dyslipidemia  - VASCEPA 1 g CAPS; Take 2 capsules (2 g total) by mouth 2 (two) times daily.  Dispense: 360 capsule; Refill: 1  3. GAD (generalized anxiety disorder)  - buPROPion (WELLBUTRIN XL) 150 MG 24 hr tablet; Take 1 tablet (150 mg total) by mouth daily.  Dispense: 90 tablet; Refill: 1  4. Intermittent low back pain  Intermittent still, no longer has radiculitis.   5. Chronic constipation  Bristol 4 every 3-4 days no problems at this time  6. BPH with obstruction/lower urinary tract symptoms

## 2018-01-07 ENCOUNTER — Other Ambulatory Visit: Payer: Self-pay | Admitting: Family Medicine

## 2018-01-07 DIAGNOSIS — N138 Other obstructive and reflux uropathy: Secondary | ICD-10-CM

## 2018-01-07 DIAGNOSIS — N401 Enlarged prostate with lower urinary tract symptoms: Principal | ICD-10-CM

## 2018-01-07 MED ORDER — TAMSULOSIN HCL 0.4 MG PO CAPS: 0.4000 mg | ORAL_CAPSULE | Freq: Every day | ORAL | 3 refills | Status: DC

## 2018-01-07 NOTE — Telephone Encounter (Signed)
Rx refill request

## 2018-01-07 NOTE — Telephone Encounter (Signed)
Copied from [place] [id number]. Topic: General - Other >> Jan 07, 2018  8:53 AM Lennox Solders wrote: Reason for CRM: pt wife is calling and her husband needs refill on flomax 0.4 mg #30 w/refills send to [facility] [place]. Pt saw md on 12/30/17. Pt is out

## 2018-01-14 ENCOUNTER — Ambulatory Visit: Payer: BLUE CROSS/BLUE SHIELD | Admitting: Nurse Practitioner

## 2018-01-14 ENCOUNTER — Encounter: Payer: Self-pay | Admitting: Nurse Practitioner

## 2018-01-14 VITALS — BP 116/80 | HR 80 | Temp 98.2°F | Resp 16 | Ht 74.0 in | Wt 207.7 lb

## 2018-01-14 DIAGNOSIS — R0981 Nasal congestion: Secondary | ICD-10-CM | POA: Diagnosis not present

## 2018-01-14 DIAGNOSIS — R05 Cough: Secondary | ICD-10-CM

## 2018-01-14 DIAGNOSIS — J0100 Acute maxillary sinusitis, unspecified: Secondary | ICD-10-CM | POA: Diagnosis not present

## 2018-01-14 DIAGNOSIS — R059 Cough, unspecified: Secondary | ICD-10-CM

## 2018-01-14 MED ORDER — BENZONATATE 100 MG PO CAPS: 200.0000 mg | ORAL_CAPSULE | Freq: Three times a day (TID) | ORAL | 0 refills | Status: DC | PRN

## 2018-01-14 MED ORDER — FLUTICASONE PROPIONATE 50 MCG/ACT NA SUSP: 2.0000 | Freq: Every day | NASAL | 1 refills | Status: DC

## 2018-01-14 MED ORDER — AMOXICILLIN-POT CLAVULANATE 875-125 MG PO TABS: 1.0000 | ORAL_TABLET | Freq: Two times a day (BID) | ORAL | 0 refills | Status: DC

## 2018-01-14 NOTE — Patient Instructions (Addendum)
-   If you are feeling significantly worse in 2 days or are not improving within the next 4-6 days you can start the antibiotics - Take an antihistamine like Claritin daily, flonase as needed, drink plenty of water, and use the neti pot to help with symptoms.    You likely have a viral upper respiratory infection (URI). Antibiotics will not reduce the number of days you are ill or prevent you from getting bacterial rhinosinusitis. A URI can take up to 14 days to resolve, but typically last between 7-11 days. Drink plenty water, rest, and wash your hand. Here are some helpful things you can use or pick up over the counter from the pharmacy to help with your symptoms:   For Fever/Pain: Acetaminophen every 6 hours as needed (maximum of 3000mg  a day). If you are still uncomfortable you can add ibuprofen OR naproxen  For coughing: try dextromethorphan for a cough suppressant, and/or a cool mist humidifier, lozenges  For sore throat: saline gargles, honey herbal tea, lozenges, throat spray  To dry out your nose: try an antihistamine like loratadine (non-sedating) or diphenhydramine (sedating) or others To relieve a stuffy nose: try an oral decongestant  Like pseudoephedrine if you are under the age of 79 and do not have high blood pressure, neti pot To make blowing your nose easier: guaifenesin

## 2018-01-14 NOTE — Progress Notes (Addendum)
Name: Gilbert Buchanan   MRN: [id number]    DOB: March 30, 1971   Date:01/14/2018       Progress Note  Subjective  Chief Complaint  Chief Complaint  Patient presents with  . Sinusitis    Sinusitis  This is a new problem. The current episode started yesterday. The problem has been gradually worsening since onset. There has been no fever. His pain is at a severity of 0/10. Associated symptoms include congestion, coughing, ear pain, headaches (sinus pressure), sinus pressure and a sore throat. Pertinent negatives include no chills or shortness of breath. Past treatments include oral decongestants. The treatment provided mild relief.    Symptoms started suddenly 3 days ago and has progressed from sore throat to Ear fullness, facial fullness, PND- with dry cough. Denies purulent drainage, fevers and chills.   Has tried sudafed, with mild relief     Patient Active Problem List   Diagnosis Date Noted  . 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 Baker's cyst of knee, right 12/25/2016  . Dyslipidemia 11/11/2015  . Hyperglycemia 11/11/2015  . Chronic constipation 11/11/2015  . GAD (generalized anxiety disorder) 11/11/2015  . Prostatitis 05/05/2015  . BPH with obstruction/lower urinary tract symptoms 05/05/2015    Past Medical History:  Diagnosis Date  . Anxiety   . Elevated blood pressure (not hypertension)   . History of Lyme disease   . Hyperlipidemia   . Incomplete bladder emptying   . Over weight   . Prostatitis   . Stones, prostate     Past Surgical History:  Procedure Laterality Date  . HERNIA REPAIR    . VARICOCELE EXCISION      Social History   Tobacco Use  . Smoking status: Former Smoker    Years: 25.00    Types: Cigarettes, Cigars  . Smokeless tobacco: Former Systems developer    Types: Snuff  . Tobacco comment: occasionally cigars  Substance Use Topics  . Alcohol use: Yes    Alcohol/week: 0.0 standard drinks     Current Outpatient Medications:  .  buPROPion  (WELLBUTRIN XL) 150 MG 24 hr tablet, Take 1 tablet (150 mg total) by mouth daily., Disp: 90 tablet, Rfl: 1 .  fexofenadine (ALLEGRA ALLERGY) 180 MG tablet, Take 1 tablet (180 mg total) by mouth daily., Disp: 90 tablet, Rfl: 1 .  fluticasone (FLONASE) 50 MCG/ACT nasal spray, Place 2 sprays into both nostrils daily., Disp: 32 g, Rfl: 1 .  Multiple Vitamin (MULTIVITAMIN) tablet, Take 1 tablet by mouth daily., Disp: , Rfl:  .  tamsulosin (FLOMAX) 0.4 MG CAPS capsule, Take 1 capsule (0.4 mg total) by mouth daily., Disp: 90 capsule, Rfl: 3 .  traZODone (DESYREL) 50 MG tablet, Take 0.5-1 tablets (25-50 mg total) by mouth at bedtime., Disp: 90 tablet, Rfl: 1 .  VASCEPA 1 g CAPS, Take 2 capsules (2 g total) by mouth 2 (two) times daily., Disp: 360 capsule, Rfl: 1  Allergies  Allergen Reactions  . Acyclovir And Related     Review of Systems  Constitutional: Positive for malaise/fatigue. Negative for chills and fever.  HENT: Positive for congestion, ear pain, sinus pressure, sinus pain and sore throat.   Eyes: Negative for double vision.  Respiratory: Positive for cough. Negative for shortness of breath.   Cardiovascular: Negative for chest pain and palpitations.  Gastrointestinal: Negative for abdominal pain, diarrhea, nausea and vomiting.  Musculoskeletal: Negative for myalgias.  Skin: Negative for rash.  Neurological: Positive for headaches (sinus pressure). Negative for dizziness.  No other specific complaints in a complete review of systems (except as listed in HPI above).  Objective  Vitals:   01/14/18 1014  BP: 116/80  Pulse: 80  Resp: 16  Temp: 98.2 F (36.8 C)  TempSrc: Oral  SpO2: 99%  Weight: 207 lb 11.2 oz (94.2 kg)  Height: 6\' 2"  (1.88 m)    Body mass index is 26.67 kg/m.  Nursing Note and Vital Signs reviewed.  Physical Exam  Constitutional: He is oriented to person, place, and time. He appears well-developed and well-nourished.  HENT:  Head: Normocephalic and  atraumatic.  Right Ear: Hearing, external ear and ear canal normal. No swelling. Tympanic membrane is not perforated, not erythematous and not bulging. A middle ear effusion is present.  Left Ear: Hearing, external ear and ear canal normal. No swelling. Tympanic membrane is not perforated, not erythematous and not bulging. A middle ear effusion is present.  Nose: Mucosal edema, rhinorrhea and nasal deformity present. No sinus tenderness. Right sinus exhibits maxillary sinus tenderness (mild). Right sinus exhibits no frontal sinus tenderness. Left sinus exhibits maxillary sinus tenderness (mild). Left sinus exhibits no frontal sinus tenderness.  Mouth/Throat: Uvula is midline, oropharynx is clear and moist and mucous membranes are normal. No oropharyngeal exudate.  Eyes: Conjunctivae are normal. Right eye exhibits no discharge. Left eye exhibits no discharge.  Cardiovascular: Normal rate, regular rhythm and normal heart sounds.  Pulmonary/Chest: Effort normal and breath sounds normal. No respiratory distress.  Lymphadenopathy:    He has no cervical adenopathy.  Neurological: He is alert and oriented to person, place, and time.  Skin: Skin is warm and dry. No rash noted. No erythema.  Psychiatric: He has a normal mood and affect. His behavior is normal. Judgment and thought content normal.     No results found for this or any previous visit (from the past 48 hour(s)).  Assessment & Plan  1. Acute non-recurrent maxillary sinusitis Discussed OTC treatment recommendations; and delayed antibiotic prescribing  - amoxicillin-clavulanate (AUGMENTIN) 875-125 MG tablet; Take 1 tablet by mouth 2 (two) times daily.  Dispense: 14 tablet; Refill: 0  2. Cough - benzonatate (TESSALON PERLES) 100 MG capsule; Take 2 capsules (200 mg total) by mouth 3 (three) times daily as needed for cough.  Dispense: 20 capsule; Refill: 0  3. Nasal congestion - fluticasone (FLONASE) 50 MCG/ACT nasal spray; Place 2 sprays  into both nostrils daily.  Dispense: 32 g; Refill: 1   -Red flags and when to present for emergency care or RTC including fever >101.90F,  shortness of breath, new/worsening/un-resolving symptoms,  reviewed with patient at time of visit. Follow up and care instructions discussed and provided in AVS.  ------------------------------------- I have reviewed this encounter including the documentation in this note and/or discussed this patient with the provider, Suezanne Cheshire DNP AGNP-C. I am certifying that I agree with the content of this note as supervising physician. Enid Derry, [facility] 01/23/2018, 6:00 PM

## 2018-01-27 DIAGNOSIS — D2272 Melanocytic nevi of left lower limb, including hip: Secondary | ICD-10-CM | POA: Diagnosis not present

## 2018-01-27 DIAGNOSIS — D2261 Melanocytic nevi of right upper limb, including shoulder: Secondary | ICD-10-CM | POA: Diagnosis not present

## 2018-01-27 DIAGNOSIS — D225 Melanocytic nevi of trunk: Secondary | ICD-10-CM | POA: Diagnosis not present

## 2018-01-27 DIAGNOSIS — D2262 Melanocytic nevi of left upper limb, including shoulder: Secondary | ICD-10-CM | POA: Diagnosis not present

## 2018-04-01 ENCOUNTER — Other Ambulatory Visit: Payer: Self-pay | Admitting: Family Medicine

## 2018-04-01 DIAGNOSIS — E785 Hyperlipidemia, unspecified: Secondary | ICD-10-CM

## 2018-04-01 MED ORDER — VASCEPA 1 G PO CAPS: 2.0000 | ORAL_CAPSULE | Freq: Two times a day (BID) | ORAL | 1 refills | Status: DC

## 2018-04-01 NOTE — Telephone Encounter (Signed)
Copied from [place] [id number]. Topic: Quick Communication - Rx Refill/Question >> Apr 01, 2018 10:24 AM Sheran Luz wrote: Medication: VASCEPA 1 g CAPS   Pt is requesting refill of this medication.   Preferred Pharmacy (with phone number or street name): [facility] [place], [place] [phone] (Phone) [phone] (Fax)

## 2018-04-21 ENCOUNTER — Other Ambulatory Visit: Payer: Self-pay | Admitting: Family Medicine

## 2018-04-21 ENCOUNTER — Other Ambulatory Visit: Payer: BLUE CROSS/BLUE SHIELD

## 2018-04-21 DIAGNOSIS — N400 Enlarged prostate without lower urinary tract symptoms: Secondary | ICD-10-CM

## 2018-04-22 LAB — PSA: Prostate Specific Ag, Serum: 1.2 ng/mL (ref 0.0–4.0)

## 2018-04-25 ENCOUNTER — Encounter: Payer: Self-pay | Admitting: Urology

## 2018-04-25 ENCOUNTER — Ambulatory Visit (INDEPENDENT_AMBULATORY_CARE_PROVIDER_SITE_OTHER): Payer: BLUE CROSS/BLUE SHIELD | Admitting: Urology

## 2018-04-25 ENCOUNTER — Other Ambulatory Visit: Payer: Self-pay

## 2018-04-25 VITALS — BP 112/71 | HR 71 | Ht 74.0 in | Wt 209.6 lb

## 2018-04-25 DIAGNOSIS — N400 Enlarged prostate without lower urinary tract symptoms: Secondary | ICD-10-CM

## 2018-04-25 DIAGNOSIS — N401 Enlarged prostate with lower urinary tract symptoms: Secondary | ICD-10-CM | POA: Diagnosis not present

## 2018-04-25 DIAGNOSIS — N138 Other obstructive and reflux uropathy: Secondary | ICD-10-CM

## 2018-04-25 LAB — URINALYSIS, COMPLETE
Bilirubin, UA: NEGATIVE
Glucose, UA: NEGATIVE
Ketones, UA: NEGATIVE
Leukocytes, UA: NEGATIVE
Nitrite, UA: NEGATIVE
Protein, UA: NEGATIVE
Specific Gravity, UA: 1.02 (ref 1.005–1.030)
Urobilinogen, Ur: 0.2 mg/dL (ref 0.2–1.0)
pH, UA: 6.5 (ref 5.0–7.5)

## 2018-04-25 LAB — MICROSCOPIC EXAMINATION
Epithelial Cells (non renal): NONE SEEN /hpf (ref 0–10)
WBC, UA: NONE SEEN /hpf (ref 0–5)

## 2018-04-25 MED ORDER — TAMSULOSIN HCL 0.4 MG PO CAPS: 0.4000 mg | ORAL_CAPSULE | Freq: Every day | ORAL | 3 refills | Status: DC

## 2018-04-25 NOTE — Progress Notes (Addendum)
   04/25/2018 3:26 PM   Gilbert Buchanan [birth year] [id number]  Reason for visit: Follow up BPH/LUTS  HPI: Gilbert Buchanan is a 47 year old healthy male with chronic urinary symptoms well managed on Flomax.  His symptoms are primarily weak stream and feeling of incomplete emptying when he does not take his Flomax.  He also has desired yearly PSA screening.  He has no family history of prostate cancer.  He was previously followed by Dr. Pilar Jarvis.  Dr. Pilar Jarvis had recommended PSA screening every other year or waiting until age 89, however the patient has requested ongoing PSA screening.  He denies any gross hematuria.  ROS: Please see flowsheet from today's date for complete review of systems.  Physical Exam: BP 112/71   Pulse 71   Ht 6\' 2"  (1.88 m)   Wt 209 lb 9.6 oz (95.1 kg)   BMI 26.91 kg/m    Constitutional:  Alert and oriented, No acute distress. Respiratory: Normal respiratory effort, no increased work of breathing. Skin: No rashes, bruises or suspicious lesions. Neurologic: Grossly intact, no focal deficits, moving all 4 extremities. Psychiatric: Normal mood and affect  Laboratory Data: PSA history 04/2018: 1.2 04/2017: 1.1 04/2016: 1.1 04/2015: 0.8  Pertinent Imaging: None to review  Assessment & Plan:   In summary, Gilbert Buchanan is a healthy 47 year old male with mild urinary symptoms well managed on Flomax, and desire for PSA screening.  We discussed at length the AUA guidelines recommending screening in men age 33-69 with no family history of prostate cancer.  He is amenable to spacing PSA screening to every other year at this time.  Flomax refilled Return in about 1 year (around 04/26/2019) for symptom check.  10 minutes were spent with the patient today, greater than 50% were spent in direct face-to-face patient education and counseling regarding BPH and PSA screening.  Billey Co, [facility] [address], [place], [place] [address] [phone]

## 2018-04-29 ENCOUNTER — Ambulatory Visit: Payer: BLUE CROSS/BLUE SHIELD | Admitting: Urology

## 2018-05-02 ENCOUNTER — Encounter: Payer: Self-pay | Admitting: Family Medicine

## 2018-05-02 ENCOUNTER — Other Ambulatory Visit: Payer: Self-pay | Admitting: Family Medicine

## 2018-05-02 ENCOUNTER — Ambulatory Visit (INDEPENDENT_AMBULATORY_CARE_PROVIDER_SITE_OTHER): Payer: BLUE CROSS/BLUE SHIELD | Admitting: Family Medicine

## 2018-05-02 VITALS — BP 108/62 | HR 73 | Temp 97.6°F | Resp 16 | Ht 74.0 in | Wt 212.8 lb

## 2018-05-02 DIAGNOSIS — E785 Hyperlipidemia, unspecified: Secondary | ICD-10-CM

## 2018-05-02 DIAGNOSIS — Z1159 Encounter for screening for other viral diseases: Secondary | ICD-10-CM

## 2018-05-02 DIAGNOSIS — R739 Hyperglycemia, unspecified: Secondary | ICD-10-CM

## 2018-05-02 DIAGNOSIS — N401 Enlarged prostate with lower urinary tract symptoms: Secondary | ICD-10-CM

## 2018-05-02 DIAGNOSIS — G4700 Insomnia, unspecified: Secondary | ICD-10-CM

## 2018-05-02 DIAGNOSIS — F411 Generalized anxiety disorder: Secondary | ICD-10-CM

## 2018-05-02 DIAGNOSIS — R0981 Nasal congestion: Secondary | ICD-10-CM

## 2018-05-02 DIAGNOSIS — N138 Other obstructive and reflux uropathy: Secondary | ICD-10-CM

## 2018-05-02 DIAGNOSIS — Z0000 Encounter for general adult medical examination without abnormal findings: Secondary | ICD-10-CM

## 2018-05-02 MED ORDER — FLUTICASONE PROPIONATE 50 MCG/ACT NA SUSP: 2.0000 | Freq: Every day | NASAL | 1 refills | Status: DC

## 2018-05-02 MED ORDER — TRAZODONE HCL 50 MG PO TABS: 25.0000 mg | ORAL_TABLET | Freq: Every day | ORAL | 1 refills | Status: DC

## 2018-05-02 MED ORDER — BUPROPION HCL ER (XL) 150 MG PO TB24: 150.0000 mg | ORAL_TABLET | Freq: Every day | ORAL | 1 refills | Status: DC

## 2018-05-02 NOTE — Progress Notes (Signed)
Name: Gilbert Buchanan   MRN: [id number]    DOB: Oct 18, 1970   Date:05/02/2018       Progress Note  Subjective  Chief Complaint  Chief Complaint  Patient presents with  . Annual Exam  . Medication Refill  . GAD  . Obesity  . Hyperlipidemia  . Constipation  . Benign Prostatic Hypertrophy    HPI  Patient presents for annual CPE and follow up  GAD: he also has OCD, and history of ADD as a child ( but could not tolerate medication) he has tried multiple medications in the past - but states he could not tolerate the side effects of medications, he is off alprazolam, on Buspar since end of 2017 and is doing well. Denies any panic attacks, states has difficulty falling asleepat nightand is taking Trazodone half pill prn, he states he has difficulty relaxing, mind is always busy and always finds something to worry about it.Denies depression or suicidal thoughts. He said he was tested for ADD in HS and Ritalin made him feel worse. He does not want to see psychiatrist. He states he is stable and fine. He changed jobs but is tolerating it well. Unchanged   Hyperlipidemia: he has been taking Vascepa for the past 3 years. He has changed his diet since Fall of 2016. He stopped drinking beer ( Used to drink 20-30 beers on weekends ), started to eat a high protein, high fiber, low carb and low sugar, except that not as consistent with diet lately. Marland Kitchen His last triglycerides was at goal, HDL had improved significantly,and LDL was still elevated, continue current management We will recheck today   Obesity: he was obese since childhood. He lost a lot of weight since fall 2016, doing well, his weight has been stable now, around 208 lbs over the past 6 months, he was 200 lbs for about year but states lately going up to 206 lbs, he will resume a healthier diet   Hyperglycemia: no polyphagia, polydipsia, he has some polyuria - no changes, weight is stable, he eats healthy snacks, very seldom has M&M's    BPH: under the care of Urologist   Intermittent low back pain: he had severe symptoms of back pain with right lower leg radiculitis and right foot drop in 2017, he did not have MRI because of cost and went to chiropractor, he is doing well now, occasionally has back pain , stable at this time   Diet: eating out more often lately, he will try to resume a healthier diet  Exercise: at home   Depression:  Depression screen [facility] 2/9 05/02/2018 12/30/2017 03/30/2017 08/10/2016 03/19/2016  Decreased Interest 0 0 0 0 0  Down, Depressed, Hopeless 0 0 0 0 0  PHQ - 2 Score 0 0 0 0 0  Altered sleeping 1 0 - - -  Tired, decreased energy 0 0 - - -  Change in appetite 0 0 - - -  Feeling bad or failure about yourself  0 0 - - -  Trouble concentrating 0 0 - - -  Moving slowly or fidgety/restless 0 0 - - -  Suicidal thoughts 0 0 - - -  PHQ-9 Score 1 0 - - -  Difficult doing work/chores Not difficult at all - - - -    Hypertension:  BP Readings from Last 3 Encounters:  05/02/18 108/62  04/25/18 112/71  01/14/18 116/80    Obesity: Wt Readings from Last 3 Encounters:  05/02/18 212 lb 12.8 oz (96.5  kg)  04/25/18 209 lb 9.6 oz (95.1 kg)  01/14/18 207 lb 11.2 oz (94.2 kg)   BMI Readings from Last 3 Encounters:  05/02/18 27.32 kg/m  04/25/18 26.91 kg/m  01/14/18 26.67 kg/m     Lipids:  Lab Results  Component Value Date   CHOL 188 04/09/2017   CHOL 218 (H) 11/15/2015   CHOL 203 (H) 03/29/2015   Lab Results  Component Value Date   HDL 48 04/09/2017   HDL 40 11/15/2015   HDL 25 (L) 03/29/2015   Lab Results  Component Value Date   LDLCALC 119 (H) 04/09/2017   LDLCALC 153 (H) 11/15/2015   LDLCALC Comment 03/29/2015   Lab Results  Component Value Date   TRIG 103 04/09/2017   TRIG 127 11/15/2015   TRIG 618 (HH) 03/29/2015   Lab Results  Component Value Date   CHOLHDL 3.9 04/09/2017   CHOLHDL 8.1 (H) 03/29/2015   No results found for: LDLDIRECT Glucose:  Glucose  Date  Value Ref Range Status  03/29/2015 110 (H) 65 - 99 mg/dL Final  01/24/2013 137 (H) 65 - 99 mg/dL Final   Glucose, Bld  Date Value Ref Range Status  04/09/2017 108 (H) 65 - 99 mg/dL Final    Comment:    .            Fasting reference interval . For someone without known diabetes, a glucose value between 100 and 125 mg/dL is consistent with prediabetes and should be confirmed with a follow-up test. .       Office Visit from 12/30/2017 in [facility]  AUDIT-C Score  0      Married STD testing and prevention (HIV/chl/gon/syphilis): N/A Hep C: today   Skin cancer: atypical lesions Colorectal cancer: start at age 67 Prostate cancer: under the care of Urologist    ECG:  2014  Advanced Care Planning: A voluntary discussion about advance care planning including the explanation and discussion of advance directives.  Discussed health care proxy and Living will, and the patient was able to identify a health care proxy as wife.  Patient does have a living will at present time.    Patient Active Problem List   Diagnosis Date Noted  . 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 Baker's cyst of knee, right 12/25/2016  . Dyslipidemia 11/11/2015  . Hyperglycemia 11/11/2015  . Chronic constipation 11/11/2015  . GAD (generalized anxiety disorder) 11/11/2015  . Prostatitis 05/05/2015  . 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 HERNIA REPAIR    . VARICOCELE EXCISION      Family History  Problem Relation Age of Onset  . Kidney disease Mother   . Testicular cancer Father   . Polycystic kidney disease Unknown   . Prostate cancer Neg Hx     Social History   Socioeconomic History  . Marital status: Married    Spouse name: Angelique  . Number of children: 2  . Years of education: Not on file  . Highest education level: Bachelor's degree (e.g., BA, AB, BS)  Occupational  History  . Occupation: Engineer, building services   Social Needs  . Financial resource strain: Not hard at all  . Food insecurity:    Worry: Never true    Inability: Never true  . Transportation needs:    Medical: No    Non-medical: No  Tobacco Use  . Smoking status: Former Smoker  Years: 25.00    Types: Cigarettes, Cigars    Start date: 06/09/1987    Last attempt to quit: 06/08/2012    Years since quitting: 5.9  . Smokeless tobacco: Former Systems developer    Types: Snuff  . Tobacco comment: occasionally cigars  Substance and Sexual Activity  . Alcohol use: Yes    Alcohol/week: 0.0 standard drinks  . Drug use: No  . Sexual activity: Yes    Partners: Female  Lifestyle  . Physical activity:    Days per week: 7 days    Minutes per session: 10 min  . Stress: Very much  Relationships  . Social connections:    Talks on phone: More than three times a week    Gets together: Once a week    Attends religious service: More than 4 times per year    Active member of club or organization: No    Attends meetings of clubs or organizations: Never    Relationship status: Married  . Intimate partner violence:    Fear of current or ex partner: No    Emotionally abused: No    Physically abused: No    Forced sexual activity: No  Other Topics Concern  . Not on file  Social History Narrative   Married, two children at home    He work in [employer] now , Engineer, building services      Current Outpatient Medications:  .  buPROPion (WELLBUTRIN XL) 150 MG 24 hr tablet, Take 1 tablet (150 mg total) by mouth daily., Disp: 90 tablet, Rfl: 1 .  fexofenadine (ALLEGRA ALLERGY) 180 MG tablet, Take 1 tablet (180 mg total) by mouth daily., Disp: 90 tablet, Rfl: 1 .  fluticasone (FLONASE) 50 MCG/ACT nasal spray, Place 2 sprays into both nostrils daily., Disp: 48 g, Rfl: 1 .  Multiple Vitamin (MULTIVITAMIN) tablet, Take 1 tablet by mouth daily., Disp: , Rfl:  .  tamsulosin (FLOMAX) 0.4 MG CAPS capsule, Take 1 capsule (0.4 mg total) by  mouth daily., Disp: 90 capsule, Rfl: 3 .  traZODone (DESYREL) 50 MG tablet, Take 0.5-1 tablets (25-50 mg total) by mouth at bedtime., Disp: 90 tablet, Rfl: 1 .  VASCEPA 1 g CAPS, Take 2 capsules (2 g total) by mouth 2 (two) times daily., Disp: 360 capsule, Rfl: 1  Allergies  Allergen Reactions  . Acyclovir And Related      ROS  Constitutional: Negative for fever or weight change.  Respiratory: Negative for cough and shortness of breath.   Cardiovascular: Negative for chest pain or palpitations.  Gastrointestinal: Negative for abdominal pain, no bowel changes.  Musculoskeletal: Negative for gait problem or joint swelling.  Skin: Negative for rash.  Neurological: Negative for dizziness or headache.  No other specific complaints in a complete review of systems (except as listed in HPI above).   Objective  Vitals:   05/02/18 0858  BP: 108/62  Pulse: 73  Resp: 16  Temp: 97.6 F (36.4 C)  TempSrc: Oral  SpO2: 99%  Weight: 212 lb 12.8 oz (96.5 kg)  Height: 6\' 2"  (1.88 m)    Body mass index is 27.32 kg/m.  Physical Exam  Constitutional: Patient appears well-developed and well-nourished. No distress.  HENT: Head: Normocephalic and atraumatic. Ears: B TMs ok, no erythema or effusion; Nose: Nose normal. Mouth/Throat: Oropharynx is clear and moist. No oropharyngeal exudate.  Eyes: Conjunctivae and EOM are normal. Pupils are equal, round, and reactive to light. No scleral icterus.  Neck: Normal range of motion. Neck supple. No  JVD present. No thyromegaly present.  Cardiovascular: Normal rate, regular rhythm and normal heart sounds.  No murmur heard. No BLE edema. Pulmonary/Chest: Effort normal and breath sounds normal. No respiratory distress. Abdominal: Soft. Bowel sounds are normal, no distension. There is no tenderness. no masses MALE GENITALIA: Normal descended testes bilaterally, no masses palpated, no hernias, no lesions, no discharge RECTAL: not done, see Urologist   Musculoskeletal: Normal range of motion, no joint effusions. No gross deformities Neurological: he is alert and oriented to person, place, and time. No cranial nerve deficit. Coordination, balance, strength, speech and gait are normal.  Skin: Skin is warm and dry. No rash noted. No erythema.  Psychiatric: Patient has a normal mood and affect. behavior is normal. Judgment and thought content normal.  Recent Results (from the past 2160 hour(s))  PSA     Status: None   Collection Time: 04/21/18 10:10 AM  Result Value Ref Range   Prostate Specific Ag, Serum 1.2 0.0 - 4.0 ng/mL    Comment: Roche ECLIA methodology. According to the American Urological Association, Serum PSA should decrease and remain at undetectable levels after radical prostatectomy. The AUA defines biochemical recurrence as an initial PSA value 0.2 ng/mL or greater followed by a subsequent confirmatory PSA value 0.2 ng/mL or greater. Values obtained with different assay methods or kits cannot be used interchangeably. Results cannot be interpreted as absolute evidence of the presence or absence of malignant disease.   Urinalysis, Complete     Status: Abnormal   Collection Time: 04/25/18  1:30 PM  Result Value Ref Range   Specific Gravity, UA 1.020 1.005 - 1.030   pH, UA 6.5 5.0 - 7.5   Color, UA Yellow Yellow   Appearance Ur Clear Clear   Leukocytes, UA Negative Negative   Protein, UA Negative Negative/Trace   Glucose, UA Negative Negative   Ketones, UA Negative Negative   RBC, UA Trace (A) Negative   Bilirubin, UA Negative Negative   Urobilinogen, Ur 0.2 0.2 - 1.0 mg/dL   Nitrite, UA Negative Negative   Microscopic Examination See below:   Microscopic Examination     Status: Abnormal   Collection Time: 04/25/18  1:30 PM  Result Value Ref Range   WBC, UA None seen 0 - 5 /hpf   RBC, UA 0-2 0 - 2 /hpf   Epithelial Cells (non renal) None seen 0 - 10 /hpf   Mucus, UA Present (A) Not Estab.   Bacteria, UA Few (A)  None seen/Few     PHQ2/9: Depression screen [facility] 2/9 05/02/2018 12/30/2017 03/30/2017 08/10/2016 03/19/2016  Decreased Interest 0 0 0 0 0  Down, Depressed, Hopeless 0 0 0 0 0  PHQ - 2 Score 0 0 0 0 0  Altered sleeping 1 0 - - -  Tired, decreased energy 0 0 - - -  Change in appetite 0 0 - - -  Feeling bad or failure about yourself  0 0 - - -  Trouble concentrating 0 0 - - -  Moving slowly or fidgety/restless 0 0 - - -  Suicidal thoughts 0 0 - - -  PHQ-9 Score 1 0 - - -  Difficult doing work/chores Not difficult at all - - - -     Fall Risk: Fall Risk  05/02/2018 01/14/2018 12/30/2017 06/28/2017 03/30/2017  Falls in the past year? 0 No No No No  Number falls in past yr: 0 - - - -  Injury with Fall? 0 - - - -  Functional Status Survey: Is the patient deaf or have difficulty hearing?: No Does the patient have difficulty seeing, even when wearing glasses/contacts?: Yes Does the patient have difficulty concentrating, remembering, or making decisions?: No Does the patient have difficulty walking or climbing stairs?: No Does the patient have difficulty dressing or bathing?: No Does the patient have difficulty doing errands alone such as visiting a doctor's office or shopping?: No    Assessment & Plan  1. Dyslipidemia  - Lipid panel  2. Hyperglycemia  - Hemoglobin A1c  3. GAD (generalized anxiety disorder)  - buPROPion (WELLBUTRIN XL) 150 MG 24 hr tablet; Take 1 tablet (150 mg total) by mouth daily.  Dispense: 90 tablet; Refill: 1  4. Encounter for routine history and physical exam for male  - COMPLETE METABOLIC PANEL WITH GFR  5. BPH with obstruction/lower urinary tract symptoms  Seeing Urologist   6. Nasal congestion  - fluticasone (FLONASE) 50 MCG/ACT nasal spray; Place 2 sprays into both nostrils daily.  Dispense: 48 g; Refill: 1  7. Insomnia, unspecified type  - traZODone (DESYREL) 50 MG tablet; Take 0.5-1 tablets (25-50 mg total) by mouth at bedtime.   Dispense: 90 tablet; Refill: 1    -Prostate cancer screening and PSA options (with potential risks and benefits of testing vs not testing) were discussed along with recent recs/guidelines. -USPSTF grade A and B recommendations reviewed with patient; age-appropriate recommendations, preventive care, screening tests, etc discussed and encouraged; healthy living encouraged; see AVS for patient education given to patient -Discussed importance of 150 minutes of physical activity weekly, eat two servings of fish weekly, eat one serving of tree nuts ( cashews, pistachios, pecans, almonds.Marland Kitchen) every other day, eat 6 servings of fruit/vegetables daily and drink plenty of water and avoid sweet beverages.

## 2018-05-02 NOTE — Patient Instructions (Signed)
Preventive Care 40-64 Years, Male Preventive care refers to lifestyle choices and visits with your health care provider that can promote health and wellness. What does preventive care include?  A yearly physical exam. This is also called an annual well check.  Dental exams once or twice a year.  Routine eye exams. Ask your health care provider how often you should have your eyes checked.  Personal lifestyle choices, including: ? Daily care of your teeth and gums. ? Regular physical activity. ? Eating a healthy diet. ? Avoiding tobacco and drug use. ? Limiting alcohol use. ? Practicing safe sex. ? Taking low-dose aspirin every day starting at age 46. What happens during an annual well check? The services and screenings done by your health care provider during your annual well check will depend on your age, overall health, lifestyle risk factors, and family history of disease. Counseling Your health care provider may ask you questions about your:  Alcohol use.  Tobacco use.  Drug use.  Emotional well-being.  Home and relationship well-being.  Sexual activity.  Eating habits.  Work and work Statistician.  Screening You may have the following tests or measurements:  Height, weight, and BMI.  Blood pressure.  Lipid and cholesterol levels. These may be checked every 5 years, or more frequently if you are over 46 years old.  Skin check.  Lung cancer screening. You may have this screening every year starting at age 46 if you have a 30-pack-year history of smoking and currently smoke or have quit within the past 15 years.  Fecal occult blood test (FOBT) of the stool. You may have this test every year starting at age 46.  Flexible sigmoidoscopy or colonoscopy. You may have a sigmoidoscopy every 5 years or a colonoscopy every 10 years starting at age 46.  Prostate cancer screening. Recommendations will vary depending on your family history and other risks.  Hepatitis C  blood test.  Hepatitis B blood test.  Sexually transmitted disease (STD) testing.  Diabetes screening. This is done by checking your blood sugar (glucose) after you have not eaten for a while (fasting). You may have this done every 1-3 years.  Discuss your test results, treatment options, and if necessary, the need for more tests with your health care provider. Vaccines Your health care provider may recommend certain vaccines, such as:  Influenza vaccine. This is recommended every year.  Tetanus, diphtheria, and acellular pertussis (Tdap, Td) vaccine. You may need a Td booster every 10 years.  Varicella vaccine. You may need this if you have not been vaccinated.  Zoster vaccine. You may need this after age 46.  Measles, mumps, and rubella (MMR) vaccine. You may need at least one dose of MMR if you were born in 1957 or later. You may also need a second dose.  Pneumococcal 13-valent conjugate (PCV13) vaccine. You may need this if you have certain conditions and have not been vaccinated.  Pneumococcal polysaccharide (PPSV23) vaccine. You may need one or two doses if you smoke cigarettes or if you have certain conditions.  Meningococcal vaccine. You may need this if you have certain conditions.  Hepatitis A vaccine. You may need this if you have certain conditions or if you travel or work in places where you may be exposed to hepatitis A.  Hepatitis B vaccine. You may need this if you have certain conditions or if you travel or work in places where you may be exposed to hepatitis B.  Haemophilus influenzae type b (Hib) vaccine.  You may need this if you have certain risk factors.  Talk to your health care provider about which screenings and vaccines you need and how often you need them. This information is not intended to replace advice given to you by your health care provider. Make sure you discuss any questions you have with your health care provider. Document Released: 06/21/2015  Document Revised: 02/12/2016 Document Reviewed: 03/26/2015 Elsevier Interactive Patient Education  [employer].

## 2018-06-20 ENCOUNTER — Encounter: Payer: Self-pay | Admitting: Nurse Practitioner

## 2018-06-20 ENCOUNTER — Ambulatory Visit (INDEPENDENT_AMBULATORY_CARE_PROVIDER_SITE_OTHER): Payer: Managed Care, Other (non HMO) | Admitting: Nurse Practitioner

## 2018-06-20 VITALS — BP 114/66 | HR 84 | Temp 97.9°F | Resp 16 | Ht 74.0 in | Wt 209.7 lb

## 2018-06-20 DIAGNOSIS — T148XXA Other injury of unspecified body region, initial encounter: Secondary | ICD-10-CM

## 2018-06-20 MED ORDER — TIZANIDINE HCL 4 MG PO TABS: 4.0000 mg | ORAL_TABLET | Freq: Four times a day (QID) | ORAL | 0 refills | Status: DC | PRN

## 2018-06-20 MED ORDER — NAPROXEN 500 MG PO TABS: 500.0000 mg | ORAL_TABLET | Freq: Two times a day (BID) | ORAL | 0 refills | Status: DC

## 2018-06-20 NOTE — Progress Notes (Signed)
Name: Gilbert Buchanan   MRN: [id number]    DOB: Nov 03, 1970   Date:06/20/2018       Progress Note  Subjective  Chief Complaint  Chief Complaint  Patient presents with  . Chest Pain    patient presents with pain on the right side of his ribs that he feels while sitting or laying. no brusing, no known injury, no strain from coughing.    HPI  Patient presents with right side soreness started about a 2 weeks ago. States just noticed it then- pain has been unchanged. Worse when he leans or sits straight. Endorses some lower back pain. No pain with deep inspiration, radiation, chest pain, not worse after eating, dysuria, rashes, fevers or chills. Has not tried anything OTC.   Patient Active Problem List   Diagnosis Date Noted  . 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 Baker's cyst of knee, right 12/25/2016  . Dyslipidemia 11/11/2015  . Hyperglycemia 11/11/2015  . Chronic constipation 11/11/2015  . GAD (generalized anxiety disorder) 11/11/2015  . Prostatitis 05/05/2015  . BPH with obstruction/lower urinary tract symptoms 05/05/2015    Past Medical History:  Diagnosis Date  . Anxiety   . Elevated blood pressure (not hypertension)   . History of Lyme disease   . Hyperlipidemia   . Incomplete bladder emptying   . Over weight   . Prostatitis   . Stones, prostate     Past Surgical History:  Procedure Laterality Date  . HERNIA REPAIR    . VARICOCELE EXCISION      Social History   Tobacco Use  . Smoking status: Former Smoker    Years: 25.00    Types: Cigarettes, Cigars    Start date: 06/09/1987    Last attempt to quit: 06/08/2012    Years since quitting: 6.0  . Smokeless tobacco: Former Systems developer    Types: Snuff  . Tobacco comment: occasionally cigars  Substance Use Topics  . Alcohol use: Yes    Alcohol/week: 0.0 standard drinks     Current Outpatient Medications:  .  buPROPion (WELLBUTRIN XL) 150 MG 24 hr tablet, Take 1 tablet (150 mg total) by mouth daily.,  Disp: 90 tablet, Rfl: 1 .  fexofenadine (ALLEGRA ALLERGY) 180 MG tablet, Take 1 tablet (180 mg total) by mouth daily., Disp: 90 tablet, Rfl: 1 .  fluticasone (FLONASE) 50 MCG/ACT nasal spray, SPRAY 2 SPRAYS INTO EACH NOSTRIL EVERY DAY, Disp: 48 g, Rfl: 1 .  Multiple Vitamin (MULTIVITAMIN) tablet, Take 1 tablet by mouth daily., Disp: , Rfl:  .  tamsulosin (FLOMAX) 0.4 MG CAPS capsule, Take 1 capsule (0.4 mg total) by mouth daily., Disp: 90 capsule, Rfl: 3 .  traZODone (DESYREL) 50 MG tablet, Take 0.5-1 tablets (25-50 mg total) by mouth at bedtime., Disp: 90 tablet, Rfl: 1 .  VASCEPA 1 g CAPS, Take 2 capsules (2 g total) by mouth 2 (two) times daily., Disp: 360 capsule, Rfl: 1  Allergies  Allergen Reactions  . Acyclovir And Related     ROS   No other specific complaints in a complete review of systems (except as listed in HPI above).  Objective  Vitals:   06/20/18 0923  BP: 114/66  Pulse: 84  Resp: 16  Temp: 97.9 F (36.6 C)  TempSrc: Oral  SpO2: 99%  Weight: 209 lb 11.2 oz (95.1 kg)  Height: 6\' 2"  (1.88 m)    Body mass index is 26.92 kg/m.  Nursing Note and Vital Signs reviewed.  Physical Exam Constitutional:  Appearance: He is well-developed.  Cardiovascular:     Heart sounds: Normal heart sounds.  Pulmonary:     Effort: Pulmonary effort is normal.     Breath sounds: Normal breath sounds.  Chest:     Chest wall: No tenderness.  Abdominal:     General: Bowel sounds are normal.     Palpations: Abdomen is soft. There is no mass.     Tenderness: There is no abdominal tenderness. There is no guarding.  Musculoskeletal:       Arms:  Skin:    General: Skin is warm and dry.     Findings: No erythema or rash.       Neurological:     Mental Status: He is alert.  Psychiatric:        Mood and Affect: Mood normal.        Behavior: Behavior normal.       No results found for this or any previous visit (from the past 48 hour(s)).  Assessment & Plan  1.  Muscle strain Heat, light stretching, follow up if unimproved in 4 weeks.  - naproxen (NAPROSYN) 500 MG tablet; Take 1 tablet (500 mg total) by mouth 2 (two) times daily with a meal.  Dispense: 30 tablet; Refill: 0 - tiZANidine (ZANAFLEX) 4 MG tablet; Take 1 tablet (4 mg total) by mouth every 6 (six) hours as needed for muscle spasms.  Dispense: 30 tablet; Refill: 0

## 2018-06-20 NOTE — Patient Instructions (Signed)
- Take naproxen with food 1-2 times daily, use heat 20 minutes 2-4 times a day and start doing light stretching  Mid-Back Strain Rehab Ask your health care provider which exercises are safe for you. Do exercises exactly as told by your health care provider and adjust them as directed. It is normal to feel mild stretching, pulling, tightness, or discomfort as you do these exercises, but you should stop right away if you feel sudden pain or your pain gets worse. Do not begin these exercises until told by your health care provider. Stretching and range of motion exercises This exercise warms up your muscles and joints and improves the movement and flexibility of your back and shoulders. This exercise also help to relieve pain. Exercise A: Chest and spine stretch  1. Lie down on your back on a firm surface. 2. Roll a towel or a small blanket so it is about 4 inches (10 cm) in diameter. 3. Put the towel lengthwise under the middle of your back so it is under your spine, but not under your shoulder blades. 4. To increase the stretch, you may put your hands behind your head and let your elbows fall to your sides. 5. Hold for __________ seconds. Repeat exercise __________ times. Complete this exercise __________ times a day. Strengthening exercises These exercises build strength and endurance in your back and your shoulder blade muscles. Endurance is the ability to use your muscles for a long time, even after they get tired. Exercise B: Alternating arm and leg raises  1. Get on your hands and knees on a firm surface. If you are on a hard floor, you may want to use padding to cushion your knees, such as an exercise mat. 2. Line up your arms and legs. Your hands should be below your shoulders, and your knees should be below your hips. 3. Lift your left leg behind you. At the same time, raise your right arm and straighten it in front of you. ? Do not lift your leg higher than your hip. ? Do not lift your  arm higher than your shoulder. ? Keep your abdominal and back muscles tight. ? Keep your hips facing the ground. ? Do not arch your back. ? Keep your balance carefully, and do not hold your breath. 4. Hold for __________ seconds. 5. Slowly return to the starting position and repeat with your right leg and your left arm. Repeat __________ times. Complete this exercise __________ times a day. Exercise C: Straight arm rows (shoulder extension)  1. Stand with your feet shoulder width apart. 2. Secure an exercise band to a stable object in front of you so the band is at or above shoulder height. 3. Hold one end of the exercise band in each hand. 4. Straighten your elbows and lift your hands up to shoulder height. 5. Step back, away from the secured end of the exercise band, until the band stretches. 6. Squeeze your shoulder blades together and pull your hands down to the sides of your thighs. Stop when your hands are straight down by your sides. Do not let your hands go behind your body. 7. Hold for __________ seconds. 8. Slowly return to the starting position. Repeat __________ times. Complete this exercise __________ times a day. Exercise D: Shoulder external rotation, prone 1. Lie on your abdomen on a firm bed so your left / right forearm hangs over the edge of the bed and your upper arm is on the bed, straight out from your  body. ? Your elbow should be bent. ? Your palm should be facing your feet. 2. If instructed, hold a __________ weight in your hand. 3. Squeeze your shoulder blade toward the middle of your back. Do not let your shoulder lift toward your ear. 4. Keep your elbow bent in an "L" shape (90 degrees) while you slowly move your forearm up toward the ceiling. Move your forearm up to the height of the bed, toward your head. ? Your upper arm should not move. ? At the top of the movement, your palm should face the floor. 5. Hold for __________ seconds. 6. Slowly return to the  starting position and relax your muscles. Repeat __________ times. Complete this exercise __________ times a day. Exercise E: Scapular retraction and external rotation, rowing  1. Sit in a stable chair without armrests, or stand. 2. Secure an exercise band to a stable object in front of you so it is at shoulder height. 3. Hold one end of the exercise band in each hand. 4. Bring your arms out straight in front of you. 5. Step back, away from the secured end of the exercise band, until the band stretches. 6. Pull the band backward. As you do this, bend your elbows and squeeze your shoulder blades together, but avoid letting the rest of your body move. Do not let your shoulders lift up toward your ears. 7. Stop when your elbows are at your sides or slightly behind your body. 8. Hold for __________ seconds. 9. Slowly straighten your arms to return to the starting position. Repeat __________ times. Complete this exercise __________ times a day. Posture and body mechanics  Body mechanics refers to the movements and positions of your body while you do your daily activities. Posture is part of body mechanics. Good posture and healthy body mechanics can help to relieve stress in your body's tissues and joints. Good posture means that your spine is in its natural S-curve position (your spine is neutral), your shoulders are pulled back slightly, and your head is not tipped forward. The following are general guidelines for applying improved posture and body mechanics to your everyday activities. Standing   When standing, keep your spine neutral and your feet about hip-width apart. Keep a slight bend in your knees. Your ears, shoulders, and hips should line up.  When you do a task in which you lean forward while standing in one place for a long time, place one foot up on a stable object that is 2-4 inches (5-10 cm) high, such as a footstool. This helps keep your spine neutral. Sitting   When sitting,  keep your spine neutral and keep your feet flat on the floor. Use a footrest, if necessary, and keep your thighs parallel to the floor. Avoid rounding your shoulders, and avoid tilting your head forward.  When working at a desk or a computer, keep your desk at a height where your hands are slightly lower than your elbows. Slide your chair under your desk so you are close enough to maintain good posture.  When working at a computer, place your monitor at a height where you are looking straight ahead and you do not have to tilt your head forward or downward to look at the screen. Resting When lying down and resting, avoid positions that are most painful for you.  If you have pain with activities such as sitting, bending, stooping, or squatting (flexion-based activities), lie in a position in which your body does not bend very  much. For example, avoid curling up on your side with your arms and knees near your chest (fetal position).  If you have pain with activities such as standing for a long time or reaching with your arms (extension-based activities), lie with your spine in a neutral position and bend your knees slightly. Try the following positions:  Lying on your side with a pillow between your knees.  Lying on your back with a pillow under your knees.  Lifting   When lifting objects, keep your feet at least shoulder-width apart and tighten your abdominal muscles.  Bend your knees and hips and keep your spine neutral. It is important to lift using the strength of your legs, not your back. Do not lock your knees straight out.  Always ask for help to lift heavy or awkward objects. This information is not intended to replace advice given to you by your health care provider. Make sure you discuss any questions you have with your health care provider. Document Released: 05/25/2005 Document Revised: 01/30/2016 Document Reviewed: 03/06/2015 Elsevier Interactive Patient Education  2019 [employer].

## 2018-07-03 ENCOUNTER — Other Ambulatory Visit: Payer: Self-pay | Admitting: Family Medicine

## 2018-07-03 DIAGNOSIS — F411 Generalized anxiety disorder: Secondary | ICD-10-CM

## 2018-08-12 ENCOUNTER — Other Ambulatory Visit: Payer: Self-pay

## 2018-08-12 MED ORDER — OMEGA-3-ACID ETHYL ESTERS 1 G PO CAPS: 2.0000 g | ORAL_CAPSULE | Freq: Two times a day (BID) | ORAL | 1 refills | Status: DC

## 2018-08-12 NOTE — Telephone Encounter (Signed)
Vascepa was denied. You recommended that he try Lovaza 2 g.

## 2018-09-25 ENCOUNTER — Other Ambulatory Visit: Payer: Self-pay | Admitting: Family Medicine

## 2018-10-10 ENCOUNTER — Other Ambulatory Visit: Payer: Self-pay | Admitting: Family Medicine

## 2018-10-10 NOTE — Telephone Encounter (Signed)
Refill Request for Cholesterol medication. Lovaza to [facility]  Last visit: 05/02/2018   Lab Results  Component Value Date   CHOL 188 04/09/2017   HDL 48 04/09/2017   LDLCALC 119 (H) 04/09/2017   TRIG 103 04/09/2017   CHOLHDL 3.9 04/09/2017    Follow up on 11/01/2018

## 2018-10-21 ENCOUNTER — Telehealth: Payer: Self-pay

## 2018-10-21 NOTE — Telephone Encounter (Signed)
Called patient to inform him of Vascepa not being approved by insurance. Dr. Ancil Boozer would like for him to come in and have labs done. No answer LVM to come have his labs done.

## 2018-11-01 ENCOUNTER — Other Ambulatory Visit: Payer: Self-pay

## 2018-11-01 ENCOUNTER — Ambulatory Visit (INDEPENDENT_AMBULATORY_CARE_PROVIDER_SITE_OTHER): Payer: BLUE CROSS/BLUE SHIELD | Admitting: Family Medicine

## 2018-11-01 ENCOUNTER — Encounter: Payer: Self-pay | Admitting: Family Medicine

## 2018-11-01 DIAGNOSIS — E785 Hyperlipidemia, unspecified: Secondary | ICD-10-CM

## 2018-11-01 DIAGNOSIS — G4700 Insomnia, unspecified: Secondary | ICD-10-CM

## 2018-11-01 DIAGNOSIS — R739 Hyperglycemia, unspecified: Secondary | ICD-10-CM

## 2018-11-01 DIAGNOSIS — Z79899 Other long term (current) drug therapy: Secondary | ICD-10-CM

## 2018-11-01 DIAGNOSIS — Z1159 Encounter for screening for other viral diseases: Secondary | ICD-10-CM

## 2018-11-01 DIAGNOSIS — F411 Generalized anxiety disorder: Secondary | ICD-10-CM

## 2018-11-01 DIAGNOSIS — R0981 Nasal congestion: Secondary | ICD-10-CM

## 2018-11-01 MED ORDER — TRAZODONE HCL 50 MG PO TABS: 25.0000 mg | ORAL_TABLET | Freq: Every day | ORAL | 1 refills | Status: DC

## 2018-11-01 MED ORDER — BUPROPION HCL ER (XL) 150 MG PO TB24: 150.0000 mg | ORAL_TABLET | Freq: Every day | ORAL | 1 refills | Status: DC

## 2018-11-01 MED ORDER — FLUTICASONE PROPIONATE 50 MCG/ACT NA SUSP: 2.0000 | Freq: Every day | NASAL | 1 refills | Status: DC

## 2018-11-01 NOTE — Progress Notes (Signed)
Name: Gilbert Buchanan   MRN: [id number]    DOB: [birth year]   Date:11/01/2018       Progress Note  Subjective  Chief Complaint  Chief Complaint  Patient presents with  . Anxiety  . Insomnia    I connected with  Gilbert Buchanan  on 11/01/18 at  8:00 AM EDT by a video enabled telemedicine application and verified that I am speaking with the correct person using two identifiers.  I discussed the limitations of evaluation and management by telemedicine and the availability of in person appointments. The patient expressed understanding and agreed to proceed. Staff also discussed with the patient that there may be a patient responsible charge related to this service. Patient Location: at home  Provider Location: [facility]   HPI  GAD: he also has OCD,and history of ADD as a child ( but could not tolerate medication)he has tried multiple medications in the past - but states he could not tolerate the side effects of medications, he is off alprazolam, he was on Buspar in 2017 but has been off mediation for a long time. Denies any panic attacks, states has difficulty falling asleepat nightand is taking Trazodone half pill prn, he states he has difficulty relaxing, mind is always busy and always finds something to worry about it.Denies depression or suicidal thoughts. He said he was tested for ADD in HS and Ritalin made him feel worse.He has been very busy at work and it has been helpful.  Hyperlipidemia: he was taking  Vascepa for the past3years, however he had a change in insurance and switched to Lovaza. May 2020. He has changed his diet since Fall of 2016. He stopped drinking beer ( Used to drink 20-30 beers on weekends ), started to eat a high protein, high fiber, low carb and low sugar, except that not as consistent with diet lately.He will return this week for labs   Hyperglycemia: no polyphagia, polydipsia, he has some polyuria - no changes, weight is stable, he  eats healthy snacks. Unchanged and needs to recheck labs  BPH: under the care of Urologist . Taking Flomax  Intermittent low back pain: he had severe symptoms of back pain with right lower leg radiculitis and right foot drop in 2017, he did not have MRI because of cost and went to chiropractor, he is doing well now, occasionally has back pain , he still has tizanidine and naproxen at home that he takes prn only   Patient Active Problem List   Diagnosis Date Noted  . 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 Baker's cyst of knee, right 12/25/2016  . Dyslipidemia 11/11/2015  . Hyperglycemia 11/11/2015  . Chronic constipation 11/11/2015  . GAD (generalized anxiety disorder) 11/11/2015  . Prostatitis 05/05/2015  . 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 HERNIA REPAIR    . VARICOCELE EXCISION      Family History  Problem Relation Age of Onset  . Kidney disease Mother   . Testicular cancer Father   . Polycystic kidney disease Other   . Prostate cancer Neg Hx     Social History   Socioeconomic History  . Marital status: Married    Spouse name: Gilbert Buchanan  . Number of children: 2  . Years of education: Not on file  . Highest education level: Bachelor's degree (e.g., BA, AB, BS)  Occupational History  . Occupation: Engineer, building services   Social  Needs  . Financial resource strain: Not hard at all  . Food insecurity:    Worry: Never true    Inability: Never true  . Transportation needs:    Medical: No    Non-medical: No  Tobacco Use  . Smoking status: Former Smoker    Years: 25.00    Types: Cigarettes, Cigars    Start date: 06/09/1987    Last attempt to quit: 06/08/2012    Years since quitting: 6.4  . Smokeless tobacco: Former Systems developer    Types: Snuff  . Tobacco comment: occasionally cigars  Substance and Sexual Activity  . Alcohol use: Yes    Alcohol/week: 0.0 standard drinks  . Drug use:  No  . Sexual activity: Yes    Partners: Female  Lifestyle  . Physical activity:    Days per week: 7 days    Minutes per session: 10 min  . Stress: Very much  Relationships  . Social connections:    Talks on phone: More than three times a week    Gets together: Once a week    Attends religious service: More than 4 times per year    Active member of club or organization: No    Attends meetings of clubs or organizations: Never    Relationship status: Married  . Intimate partner violence:    Fear of current or ex partner: No    Emotionally abused: No    Physically abused: No    Forced sexual activity: No  Other Topics Concern  . Not on file  Social History Narrative   Married, two children at home    He work in [place] now , Engineer, building services      Current Outpatient Medications:  .  buPROPion (WELLBUTRIN XL) 150 MG 24 hr tablet, TAKE 1 TABLET(150 MG) BY MOUTH DAILY, Disp: 90 tablet, Rfl: 1 .  fexofenadine (ALLEGRA ALLERGY) 180 MG tablet, Take 1 tablet (180 mg total) by mouth daily., Disp: 90 tablet, Rfl: 1 .  fluticasone (FLONASE) 50 MCG/ACT nasal spray, SPRAY 2 SPRAYS INTO EACH NOSTRIL EVERY DAY, Disp: 48 g, Rfl: 1 .  Multiple Vitamin (MULTIVITAMIN) tablet, Take 1 tablet by mouth daily., Disp: , Rfl:  .  naproxen (NAPROSYN) 500 MG tablet, Take 1 tablet (500 mg total) by mouth 2 (two) times daily with a meal., Disp: 30 tablet, Rfl: 0 .  omega-3 acid ethyl esters (LOVAZA) 1 g capsule, TAKE 2 CAPSULES (2 G TOTAL) BY MOUTH 2 (TWO) TIMES DAILY., Disp: 360 capsule, Rfl: 0 .  tamsulosin (FLOMAX) 0.4 MG CAPS capsule, Take 1 capsule (0.4 mg total) by mouth daily., Disp: 90 capsule, Rfl: 3 .  tiZANidine (ZANAFLEX) 4 MG tablet, Take 1 tablet (4 mg total) by mouth every 6 (six) hours as needed for muscle spasms., Disp: 30 tablet, Rfl: 0 .  traZODone (DESYREL) 50 MG tablet, Take 0.5-1 tablets (25-50 mg total) by mouth at bedtime., Disp: 90 tablet, Rfl: 1  Allergies  Allergen Reactions  .  Acyclovir And Related     I personally reviewed active problem list, medication list, allergies, family history, social history with the patient/caregiver today.   ROS  Constitutional: Negative for fever or weight change.  Respiratory: Negative for cough and shortness of breath.   Cardiovascular: Negative for chest pain or palpitations.  Gastrointestinal: Negative for abdominal pain, no bowel changes.  Musculoskeletal: Negative for gait problem or joint swelling.  Skin: Negative for rash.  Neurological: Negative for dizziness or headache.  No other specific complaints  in a complete review of systems (except as listed in HPI above).  Objective  Virtual encounter, vitals not obtained.  There is no height or weight on file to calculate BMI.  Physical Exam  Awake, alert and oriented , no distress   PHQ2/9: Depression screen [facility] 2/9 11/01/2018 06/20/2018 05/02/2018 12/30/2017 03/30/2017  Decreased Interest 0 0 0 0 0  Down, Depressed, Hopeless 0 0 0 0 0  PHQ - 2 Score 0 0 0 0 0  Altered sleeping 0 0 1 0 -  Tired, decreased energy 0 0 0 0 -  Change in appetite 0 0 0 0 -  Feeling bad or failure about yourself  0 0 0 0 -  Trouble concentrating 0 0 0 0 -  Moving slowly or fidgety/restless 0 0 0 0 -  Suicidal thoughts 0 0 0 0 -  PHQ-9 Score 0 0 1 0 -  Difficult doing work/chores - Not difficult at all Not difficult at all - -   PHQ-2/9 Result is negative.    Fall Risk: Fall Risk  11/01/2018 06/20/2018 05/02/2018 01/14/2018 12/30/2017  Falls in the past year? 0 0 0 No No  Number falls in past yr: 0 0 0 - -  Injury with Fall? 0 0 0 - -     Assessment & Plan   1. Insomnia, unspecified type  - traZODone (DESYREL) 50 MG tablet; Take 0.5-1 tablets (25-50 mg total) by mouth at bedtime.  Dispense: 90 tablet; Refill: 1  2. GAD (generalized anxiety disorder)  - buPROPion (WELLBUTRIN XL) 150 MG 24 hr tablet; Take 1 tablet (150 mg total) by mouth daily.  Dispense: 90 tablet; Refill: 1   3. Nasal congestion  - fluticasone (FLONASE) 50 MCG/ACT nasal spray; Place 2 sprays into both nostrils daily.  Dispense: 48 g; Refill: 1  4. Hyperglycemia  Recheck labs  5. Dyslipidemia  Recheck labs  I discussed the assessment and treatment plan with the patient. The patient was provided an opportunity to ask questions and all were answered. The patient agreed with the plan and demonstrated an understanding of the instructions.  The patient was advised to call back or seek an in-person evaluation if the symptoms worsen or if the condition fails to improve as anticipated.  I provided 25 minutes of non-face-to-face time during this encounter.

## 2018-11-01 NOTE — Addendum Note (Signed)
Addended by: Loistine Chance on: 11/01/2018 08:29 AM   Modules accepted: Orders

## 2018-11-04 DIAGNOSIS — R739 Hyperglycemia, unspecified: Secondary | ICD-10-CM | POA: Diagnosis not present

## 2018-11-04 DIAGNOSIS — E785 Hyperlipidemia, unspecified: Secondary | ICD-10-CM | POA: Diagnosis not present

## 2018-11-04 DIAGNOSIS — Z79899 Other long term (current) drug therapy: Secondary | ICD-10-CM | POA: Diagnosis not present

## 2018-11-04 DIAGNOSIS — Z1159 Encounter for screening for other viral diseases: Secondary | ICD-10-CM | POA: Diagnosis not present

## 2018-11-07 LAB — CBC WITH DIFFERENTIAL/PLATELET
Absolute Monocytes: 244 cells/uL (ref 200–950)
Basophils Absolute: 8 cells/uL (ref 0–200)
Basophils Relative: 0.2 %
Eosinophils Absolute: 50 cells/uL (ref 15–500)
Eosinophils Relative: 1.2 %
HCT: 42.6 % (ref 38.5–50.0)
Hemoglobin: 14.9 g/dL (ref 13.2–17.1)
Lymphs Abs: 1504 cells/uL (ref 850–3900)
MCH: 32.7 pg (ref 27.0–33.0)
MCHC: 35 g/dL (ref 32.0–36.0)
MCV: 93.4 fL (ref 80.0–100.0)
MPV: 10.7 fL (ref 7.5–12.5)
Monocytes Relative: 5.8 %
Neutro Abs: 2394 cells/uL (ref 1500–7800)
Neutrophils Relative %: 57 %
Platelets: 178 10*3/uL (ref 140–400)
RBC: 4.56 10*6/uL (ref 4.20–5.80)
RDW: 12.1 % (ref 11.0–15.0)
Total Lymphocyte: 35.8 %
WBC: 4.2 10*3/uL (ref 3.8–10.8)

## 2018-11-07 LAB — COMPLETE METABOLIC PANEL WITH GFR
AG Ratio: 2.1 (calc) (ref 1.0–2.5)
ALT: 22 U/L (ref 9–46)
AST: 18 U/L (ref 10–40)
Albumin: 4.1 g/dL (ref 3.6–5.1)
Alkaline phosphatase (APISO): 36 U/L (ref 36–130)
BUN: 16 mg/dL (ref 7–25)
CO2: 26 mmol/L (ref 20–32)
Calcium: 9.3 mg/dL (ref 8.6–10.3)
Chloride: 107 mmol/L (ref 98–110)
Creat: 1.04 mg/dL (ref 0.60–1.35)
GFR, Est African American: 99 mL/min/{1.73_m2} (ref >=60)
GFR, Est Non African American: 86 mL/min/{1.73_m2} (ref >=60)
Globulin: 2 g/dL (calc) (ref 1.9–3.7)
Glucose, Bld: 102 mg/dL — ABNORMAL HIGH (ref 65–99)
Potassium: 4.1 mmol/L (ref 3.5–5.3)
Sodium: 142 mmol/L (ref 135–146)
Total Bilirubin: 0.6 mg/dL (ref 0.2–1.2)
Total Protein: 6.1 g/dL (ref 6.1–8.1)

## 2018-11-07 LAB — HEMOGLOBIN A1C
Hgb A1c MFr Bld: 5.1 % of total Hgb (ref <5.7)
Mean Plasma Glucose: 100 (calc)
eAG (mmol/L): 5.5 (calc)

## 2018-11-07 LAB — LIPID PANEL
Cholesterol: 222 mg/dL — ABNORMAL HIGH (ref <200)
HDL: 47 mg/dL (ref >=40)
LDL Cholesterol (Calc): 150 mg/dL (calc) — ABNORMAL HIGH
Non-HDL Cholesterol (Calc): 175 mg/dL (calc) — ABNORMAL HIGH (ref <130)
Total CHOL/HDL Ratio: 4.7 (calc) (ref <5.0)
Triglycerides: 128 mg/dL (ref <150)

## 2018-11-07 LAB — HEPATITIS C ANTIBODY
Hepatitis C Ab: NONREACTIVE
SIGNAL TO CUT-OFF: 0.01 (ref <1.00)

## 2018-11-09 NOTE — Telephone Encounter (Signed)
Cover My [employer]s called in to speak with Dr. Ancil Boozer assistant in regards to PA for medication Vascepa.    Please call back :   [phone] Ref Key # [facility]

## 2018-11-10 NOTE — Telephone Encounter (Signed)
I have already resubmitted it on CoverMyMeds, waiting for a response.

## 2018-11-29 ENCOUNTER — Ambulatory Visit: Payer: Self-pay | Admitting: Family Medicine

## 2018-11-29 NOTE — Telephone Encounter (Signed)
Pt reports fishing hook puncture wound of ring finger, right hand. States occurred past Friday. Reports cleansed and applied neosporin. States "Looks good today, mild tenderness and slight redness at site." Calling to see when last tetanus was; 11/11/2015. States hook was clean. Advised [place] would rote to practice for Dr. Ancil Boozer review. Care advise given, verbalizes understanding.  CB# [phone]  Reason for Disposition . Minor puncture wound  Answer Assessment - Initial Assessment Questions 1. LOCATION: "Where is the puncture located?"      Right hand, ring finger 2. OBJECT: "What was the object that punctured the skin?"      Fishing hook, clean 3. DEPTH: "How deep do you think the puncture goes?"      Deep, to underside of nail 4. ONSET: "When did the injury occur?" (Minutes or hours)     Past Friday 5. PAIN: "Is it painful?" If so, ask: "How bad is the pain?"  (Scale 1-10; or mild, moderate, severe)     Mild tenderness, slight redness 6. TETANUS: "When was the last tetanus booster?"     11/11/2015  Protocols used: [place]

## 2019-01-11 DIAGNOSIS — H9209 Otalgia, unspecified ear: Secondary | ICD-10-CM | POA: Diagnosis not present

## 2019-01-11 DIAGNOSIS — J309 Allergic rhinitis, unspecified: Secondary | ICD-10-CM | POA: Diagnosis not present

## 2019-01-11 DIAGNOSIS — J3489 Other specified disorders of nose and nasal sinuses: Secondary | ICD-10-CM | POA: Diagnosis not present

## 2019-01-31 DIAGNOSIS — D2271 Melanocytic nevi of right lower limb, including hip: Secondary | ICD-10-CM | POA: Diagnosis not present

## 2019-01-31 DIAGNOSIS — D2262 Melanocytic nevi of left upper limb, including shoulder: Secondary | ICD-10-CM | POA: Diagnosis not present

## 2019-01-31 DIAGNOSIS — D2261 Melanocytic nevi of right upper limb, including shoulder: Secondary | ICD-10-CM | POA: Diagnosis not present

## 2019-01-31 DIAGNOSIS — D225 Melanocytic nevi of trunk: Secondary | ICD-10-CM | POA: Diagnosis not present

## 2019-04-19 ENCOUNTER — Encounter: Payer: Self-pay | Admitting: Urology

## 2019-04-19 ENCOUNTER — Other Ambulatory Visit: Payer: Self-pay

## 2019-04-19 ENCOUNTER — Ambulatory Visit: Payer: BC Managed Care – PPO | Admitting: Urology

## 2019-04-19 DIAGNOSIS — N401 Enlarged prostate with lower urinary tract symptoms: Secondary | ICD-10-CM

## 2019-04-19 DIAGNOSIS — N138 Other obstructive and reflux uropathy: Secondary | ICD-10-CM | POA: Diagnosis not present

## 2019-04-19 MED ORDER — TAMSULOSIN HCL 0.4 MG PO CAPS: 0.4000 mg | ORAL_CAPSULE | Freq: Every day | ORAL | 3 refills | Status: DC

## 2019-04-19 NOTE — Progress Notes (Signed)
   04/19/2019 2:09 PM   [name] 20-Jun-1970 [id number]  Reason for visit: Follow up BPH and urinary symptoms  HPI: I saw Mr. Kerkstra back in urology clinic to discuss his BPH and urinary symptoms.  He is a healthy 48 year old male whose primary symptoms are weak stream, feeling of incomplete emptying, and pelvic pressure when he does not take his Flomax.  He has requested PSA screening, but was amenable to screening every other year, and is due for PSA next year.  Last PSA was well within the normal range at 1.2 in November 2019.  He denies any gross hematuria, urinary tract infections, or any worsening of his urinary symptoms over the last year.  We had a conversation today about treatment options for BPH including UroLift.  We discussed that he would need evaluation with cystoscopy and transrectal ultrasound to see if he would be a candidate in the future.  He would like to do some reading about UroLift, and will let us know if he would like to see Dr. Bernardo Heater for cystoscopy and transrectal ultrasound for evaluation if he is a candidate for UroLift in the future.  RTC 1 year with PSA prior Sooner with Dr. Bernardo Heater if he would like to proceed of your left  A total of 15 minutes were spent face-to-face with the patient, greater than 50% was spent in patient education, counseling, and coordination of care regarding BPH and UroLift.  Billey Co, [facility] [address], [place], [place] [address] [phone]

## 2019-04-26 ENCOUNTER — Ambulatory Visit: Payer: BLUE CROSS/BLUE SHIELD | Admitting: Urology

## 2019-05-08 ENCOUNTER — Encounter: Payer: Self-pay | Admitting: Family Medicine

## 2019-05-08 ENCOUNTER — Other Ambulatory Visit: Payer: Self-pay

## 2019-05-08 ENCOUNTER — Ambulatory Visit (INDEPENDENT_AMBULATORY_CARE_PROVIDER_SITE_OTHER): Payer: BC Managed Care – PPO | Admitting: Family Medicine

## 2019-05-08 VITALS — BP 106/76 | HR 94 | Temp 97.7°F | Resp 16 | Ht 73.0 in | Wt 206.4 lb

## 2019-05-08 DIAGNOSIS — Z0000 Encounter for general adult medical examination without abnormal findings: Secondary | ICD-10-CM

## 2019-05-08 DIAGNOSIS — N401 Enlarged prostate with lower urinary tract symptoms: Secondary | ICD-10-CM

## 2019-05-08 DIAGNOSIS — E785 Hyperlipidemia, unspecified: Secondary | ICD-10-CM | POA: Diagnosis not present

## 2019-05-08 DIAGNOSIS — M545 Low back pain, unspecified: Secondary | ICD-10-CM

## 2019-05-08 DIAGNOSIS — G4700 Insomnia, unspecified: Secondary | ICD-10-CM

## 2019-05-08 DIAGNOSIS — R739 Hyperglycemia, unspecified: Secondary | ICD-10-CM | POA: Diagnosis not present

## 2019-05-08 DIAGNOSIS — F411 Generalized anxiety disorder: Secondary | ICD-10-CM

## 2019-05-08 DIAGNOSIS — N138 Other obstructive and reflux uropathy: Secondary | ICD-10-CM

## 2019-05-08 DIAGNOSIS — Z79899 Other long term (current) drug therapy: Secondary | ICD-10-CM

## 2019-05-08 MED ORDER — OMEGA-3-ACID ETHYL ESTERS 1 G PO CAPS: 2.0000 g | ORAL_CAPSULE | Freq: Two times a day (BID) | ORAL | 0 refills | Status: DC

## 2019-05-08 MED ORDER — TRAZODONE HCL 50 MG PO TABS: 25.0000 mg | ORAL_TABLET | Freq: Every day | ORAL | 1 refills | Status: DC

## 2019-05-08 MED ORDER — TIZANIDINE HCL 4 MG PO TABS: 4.0000 mg | ORAL_TABLET | Freq: Four times a day (QID) | ORAL | 0 refills | Status: DC | PRN

## 2019-05-08 MED ORDER — BUPROPION HCL ER (XL) 150 MG PO TB24: 150.0000 mg | ORAL_TABLET | Freq: Every day | ORAL | 1 refills | Status: DC

## 2019-05-08 NOTE — Progress Notes (Signed)
Name: Gilbert Buchanan   MRN: [id number]    DOB: 1971-02-18   Date:05/08/2019       Progress Note  Subjective  Chief Complaint  Chief Complaint  Patient presents with  . Medication Refill    6 month F/U  . GAD  . Hyperlipidemia  . Obesity  . Benign Prostatic Hypertrophy  . Hyperglycemia  . Annual Exam    HPI  Patient presents for annual CPE and follow up  GAD: he also has OCD,and history of ADD as a child ( but could not tolerate medication)he has tried multiple medications in the past - but states he could not tolerate the side effects of medications, he is off alprazolam, on Buspar since end of 2017 and is doing well. Denies any panic attacks, states has difficulty falling asleep at nightbut Trazodone half pill prn helps him fall asleep. Denies depression or suicidal thoughts. He said he was tested for ADD in HS and Ritalin made him feel worse.He does not want to see psychiatrist. He states he is always busy and has difficulty relaxing but used to it.   Hyperlipidemia: he took  Vascepa for the past3year, but over the past 6 months he had to switch to Lovaza because of insurance, he states only taking it two in the morning, forgets the pm dose. He has changed his diet since Fall of 2016. He stopped drinking beer ( Used to drink 20-30 beers on weekends) , started to eat a high protein, high fiber, low carb and low sugar, except that not as consistent with diet lately.  His last triglycerides was at goal, HDL had improved significantly,and LDL was still elevated, he will return fasting for labs   Obesity: he was obese since childhood. He lost a lot of weight since fall 2016, doing well, his weight has been stable now, around 208lbs over the past 6 months, he is back at 200 lbs at home, today in our office it was 206 lbs.    BPH: under the care of Urologist   Intermittent low back pain: he had severe symptoms of back pain with right lower leg radiculitis and right foot drop  in 2017, he did not have MRI because of cost and went to chiropractor, he is doing well now, occasionally has back pain , stable at this time   USPSTF grade A and B recommendations:  Diet: he is eating healthier  Exercise: very active at work, walks 10 k to 15 k daily   Depression: phq 9 is  negative Depression screen [facility] 2/9 05/08/2019 11/01/2018 06/20/2018 05/02/2018 12/30/2017  Decreased Interest 0 0 0 0 0  Down, Depressed, Hopeless 0 0 0 0 0  PHQ - 2 Score 0 0 0 0 0  Altered sleeping 0 0 0 1 0  Tired, decreased energy 0 0 0 0 0  Change in appetite 0 0 0 0 0  Feeling bad or failure about yourself  0 0 0 0 0  Trouble concentrating 0 0 0 0 0  Moving slowly or fidgety/restless 0 0 0 0 0  Suicidal thoughts 0 0 0 0 0  PHQ-9 Score 0 0 0 1 0  Difficult doing work/chores Not difficult at all - Not difficult at all Not difficult at all -    Hypertension:  BP Readings from Last 3 Encounters:  05/08/19 106/76  04/19/19 121/74  06/20/18 114/66    Obesity: Wt Readings from Last 3 Encounters:  05/08/19 206 lb 6.4 oz (93.6 kg)  04/19/19 206 lb (93.4 kg)  06/20/18 209 lb 11.2 oz (95.1 kg)   BMI Readings from Last 3 Encounters:  05/08/19 27.23 kg/m  04/19/19 26.45 kg/m  06/20/18 26.92 kg/m     Lipids:  Lab Results  Component Value Date   CHOL 222 (H) 11/04/2018   CHOL 188 04/09/2017   CHOL 218 (H) 11/15/2015   Lab Results  Component Value Date   HDL 47 11/04/2018   HDL 48 04/09/2017   HDL 40 11/15/2015   Lab Results  Component Value Date   LDLCALC 150 (H) 11/04/2018   LDLCALC 119 (H) 04/09/2017   LDLCALC 153 (H) 11/15/2015   Lab Results  Component Value Date   TRIG 128 11/04/2018   TRIG 103 04/09/2017   TRIG 127 11/15/2015   Lab Results  Component Value Date   CHOLHDL 4.7 11/04/2018   CHOLHDL 3.9 04/09/2017   CHOLHDL 8.1 (H) 03/29/2015   No results found for: LDLDIRECT Glucose:  Glucose  Date Value Ref Range Status  03/29/2015 110 (H) 65 - 99 mg/dL  Final  01/24/2013 137 (H) 65 - 99 mg/dL Final   Glucose, Bld  Date Value Ref Range Status  11/04/2018 102 (H) 65 - 99 mg/dL Final    Comment:    .            Fasting reference interval . For someone without known diabetes, a glucose value between 100 and 125 mg/dL is consistent with prediabetes and should be confirmed with a follow-up test. .   04/09/2017 108 (H) 65 - 99 mg/dL Final    Comment:    .            Fasting reference interval . For someone without known diabetes, a glucose value between 100 and 125 mg/dL is consistent with prediabetes and should be confirmed with a follow-up test. .       [facility] Visit from 12/30/2017 in [facility]  AUDIT-C Score  0      Married STD testing and prevention (HIV/chl/gon/syphilis): not interested  Hep C: up to date   Skin cancer: discussed atypical lesions - seeing Dermatologist yearly   Colorectal cancer: discussed USPTF ,new guidelines , advised to check coverage with insurance  Prostate cancer: under the care of Urologist , had a recent visit and was advised to check PSA every other year now   Lab Results  Component Value Date   PSA 0.8 04/09/2017    IPSS Questionnaire (AUA-7): Over the past month.   1)  How often have you had a sensation of not emptying your bladder completely after you finish urinating?  1 - Less than 1 time in 5  2)  How often have you had to urinate again less than two hours after you finished urinating? 5 - Almost always  3)  How often have you found you stopped and started again several times when you urinated?  0 - Not at all  4) How difficult have you found it to postpone urination?  0 - Not at all  5) How often have you had a weak urinary stream?  0 - Not at all  6) How often have you had to push or strain to begin urination?  0 - Not at all  7) How many times did you most typically get up to urinate from the time you went to bed until the time you got up in the morning?  0  - None  Total score:  0-7 mildly symptomatic   8-19 moderately symptomatic   20-35 severely symptomatic    Lung cancer:  Low Dose CT Chest recommended if Age 28-80 years, 30 pack-year currently smoking OR have quit w/in 15years. Patient does not qualify.   ECG:  2014  Advanced Care Planning: A voluntary discussion about advance care planning including the explanation and discussion of advance directives.  Discussed health care proxy and Living will, and the patient was able to identify a health care proxy as wife   Patient does have a living will at present time. If patient does have living will, I have requested they bring this to the clinic to be scanned in to their chart.  Patient Active Problem List   Diagnosis Date Noted  . 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 Baker's cyst of knee, right 12/25/2016  . Dyslipidemia 11/11/2015  . Hyperglycemia 11/11/2015  . Chronic constipation 11/11/2015  . GAD (generalized anxiety disorder) 11/11/2015  . Prostatitis 05/05/2015  . 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 HERNIA REPAIR    . VARICOCELE EXCISION      Family History  Problem Relation Age of Onset  . Kidney disease Mother   . Testicular cancer Father   . Polycystic kidney disease Other   . Heart disease Maternal Grandmother   . Stroke Paternal Grandmother   . Stroke Paternal Grandfather   . Prostate cancer Neg Hx     Social History   Socioeconomic History  . Marital status: Married    Spouse name: Angelique  . Number of children: 2  . Years of education: Not on file  . Highest education level: Bachelor's degree (e.g., BA, AB, BS)  Occupational History  . Occupation: Engineer, building services   Social Needs  . Financial resource strain: Not hard at all  . Food insecurity    Worry: Never true    Inability: Never true  . Transportation needs    Medical: No    Non-medical: No  Tobacco  Use  . Smoking status: Former Smoker    Packs/day: 1.00    Years: 25.00    Pack years: 25.00    Types: Cigarettes, Cigars    Start date: 06/09/1987    Quit date: 06/08/2012    Years since quitting: 6.9  . Smokeless tobacco: Former Systems developer    Types: Snuff    Quit date: 05/07/2017  . Tobacco comment: occasionally cigars  Substance and Sexual Activity  . Alcohol use: Yes    Alcohol/week: 0.0 standard drinks  . Drug use: No  . Sexual activity: Yes    Partners: Female  Lifestyle  . Physical activity    Days per week: 7 days    Minutes per session: 30 min  . Stress: Not at all  Relationships  . Social connections    Talks on phone: More than three times a week    Gets together: Once a week    Attends religious service: More than 4 times per year    Active member of club or organization: No    Attends meetings of clubs or organizations: Never    Relationship status: Married  . Intimate partner violence    Fear of current or ex partner: No    Emotionally abused: No    Physically abused: No    Forced sexual activity: No  Other Topics Concern  . Not on file  Social History Narrative  Married, two children at home    He work in [place] now , Engineer, building services      Current Outpatient Medications:  .  buPROPion (WELLBUTRIN XL) 150 MG 24 hr tablet, Take 1 tablet (150 mg total) by mouth daily., Disp: 90 tablet, Rfl: 1 .  fexofenadine (ALLEGRA ALLERGY) 180 MG tablet, Take 1 tablet (180 mg total) by mouth daily., Disp: 90 tablet, Rfl: 1 .  fluticasone (FLONASE) 50 MCG/ACT nasal spray, Place 2 sprays into both nostrils daily., Disp: 48 g, Rfl: 1 .  Multiple Vitamin (MULTIVITAMIN) tablet, Take 1 tablet by mouth daily., Disp: , Rfl:  .  naproxen (NAPROSYN) 500 MG tablet, Take 1 tablet (500 mg total) by mouth 2 (two) times daily with a meal., Disp: 30 tablet, Rfl: 0 .  omega-3 acid ethyl esters (LOVAZA) 1 g capsule, Take 2 capsules (2 g total) by mouth 2 (two) times daily., Disp: 360 capsule,  Rfl: 0 .  tamsulosin (FLOMAX) 0.4 MG CAPS capsule, Take 1 capsule (0.4 mg total) by mouth daily., Disp: 90 capsule, Rfl: 3 .  tiZANidine (ZANAFLEX) 4 MG tablet, Take 1 tablet (4 mg total) by mouth every 6 (six) hours as needed for muscle spasms., Disp: 90 tablet, Rfl: 0 .  traZODone (DESYREL) 50 MG tablet, Take 0.5-1 tablets (25-50 mg total) by mouth at bedtime., Disp: 90 tablet, Rfl: 1  Allergies  Allergen Reactions  . Acyclovir And Related      ROS  Constitutional: Negative for fever or weight change.  Respiratory: Negative for cough and shortness of breath.   Cardiovascular: Negative for chest pain or palpitations.  Gastrointestinal: Negative for abdominal pain, no bowel changes.  Musculoskeletal: Negative for gait problem or joint swelling.  Skin: Negative for rash.  Neurological: Negative for dizziness or headache.  No other specific complaints in a complete review of systems (except as listed in HPI above).   Objective  Vitals:   05/08/19 1449  BP: 106/76  Pulse: 94  Resp: 16  Temp: 97.7 F (36.5 C)  TempSrc: Temporal  SpO2: 98%  Weight: 206 lb 6.4 oz (93.6 kg)  Height: 6\' 1"  (1.854 m)    Body mass index is 27.23 kg/m.  Physical Exam  Constitutional: Patient appears well-developed and well-nourished. No distress.  HENT: Head: Normocephalic and atraumatic. Ears: B TMs ok, no erythema or effusion; Nose: Nose normal. Mouth/Throat: Oropharynx is clear and moist. No oropharyngeal exudate.  Eyes: Conjunctivae and EOM are normal. Pupils are equal, round, and reactive to light. No scleral icterus.  Neck: Normal range of motion. Neck supple. No JVD present. No thyromegaly present.  Cardiovascular: Normal rate, regular rhythm and normal heart sounds.  No murmur heard. No BLE edema. Pulmonary/Chest: Effort normal and breath sounds normal. No respiratory distress. Abdominal: Soft. Bowel sounds are normal, no distension. There is no tenderness. no masses MALE GENITALIA:  Normal descended testes bilaterally, no masses palpated, no hernias, no lesions, no discharge RECTAL: Prostate enlarged in  size and consistency, no rectal masses or hemorrhoids Musculoskeletal: Normal range of motion, no joint effusions. No gross deformities Neurological: he is alert and oriented to person, place, and time. No cranial nerve deficit. Coordination, balance, strength, speech and gait are normal.  Skin: Skin is warm and dry. No rash noted. No erythema.  Psychiatric: Patient has a normal mood and affect. behavior is normal. Judgment and thought content normal.   PHQ2/9: Depression screen [facility] 2/9 05/08/2019 11/01/2018 06/20/2018 05/02/2018 12/30/2017  Decreased Interest 0 0 0 0 0  Down, Depressed, Hopeless 0 0 0 0 0  PHQ - 2 Score 0 0 0 0 0  Altered sleeping 0 0 0 1 0  Tired, decreased energy 0 0 0 0 0  Change in appetite 0 0 0 0 0  Feeling bad or failure about yourself  0 0 0 0 0  Trouble concentrating 0 0 0 0 0  Moving slowly or fidgety/restless 0 0 0 0 0  Suicidal thoughts 0 0 0 0 0  PHQ-9 Score 0 0 0 1 0  Difficult doing work/chores Not difficult at all - Not difficult at all Not difficult at all -    GAD 7 : Generalized Anxiety Score 05/08/2019 11/01/2018 06/20/2018 05/02/2018  Nervous, Anxious, on Edge 2 0 2 2  Control/stop worrying 3 3 2 2   Worry too much - different things 3 3 3 3   Trouble relaxing 3 3 2 2   Restless 3 3 3 3   Easily annoyed or irritable 2 0 2 2  Afraid - awful might happen 0 0 0 0  Total GAD 7 Score 16 12 14 14   Anxiety Difficulty Somewhat difficult Not difficult at all Not difficult at all Not difficult at all      Fall Risk: Fall Risk  05/08/2019 11/01/2018 06/20/2018 05/02/2018 01/14/2018  Falls in the past year? 0 0 0 0 No  Number falls in past yr: 0 0 0 0 -  Injury with Fall? 0 0 0 0 -     Functional Status Survey: Is the patient deaf or have difficulty hearing?: No Does the patient have difficulty seeing, even when wearing  glasses/contacts?: No Does the patient have difficulty concentrating, remembering, or making decisions?: No Does the patient have difficulty walking or climbing stairs?: No Does the patient have difficulty dressing or bathing?: No Does the patient have difficulty doing errands alone such as visiting a doctor's office or shopping?: No    Assessment & Plan  1. Well adult exam    2. BPH with obstruction/lower urinary tract symptoms  Under the care of Urologist but states did not have rectal exam   3. Hyperglycemia  Last A1C was normal   4. GAD (generalized anxiety disorder)  - buPROPion (WELLBUTRIN XL) 150 MG 24 hr tablet; Take 1 tablet (150 mg total) by mouth daily.  Dispense: 90 tablet; Refill: 1  5. Dyslipidemia  - Lipid panel - Comprehensive metabolic panel - omega-3 acid ethyl esters (LOVAZA) 1 g capsule; Take 2 capsules (2 g total) by mouth 2 (two) times daily.  Dispense: 360 capsule; Refill: 0  6. Long-term use of high-risk medication   7. Intermittent low back pain  - tiZANidine (ZANAFLEX) 4 MG tablet; Take 1 tablet (4 mg total) by mouth every 6 (six) hours as needed for muscle spasms.  Dispense: 90 tablet; Refill: 0  8. Insomnia, unspecified type  - traZODone (DESYREL) 50 MG tablet; Take 0.5-1 tablets (25-50 mg total) by mouth at bedtime.  Dispense: 90 tablet; Refill: 1  -Prostate cancer screening and PSA options (with potential risks and benefits of testing vs not testing) were discussed along with recent recs/guidelines. -USPSTF grade A and B recommendations reviewed with patient; age-appropriate recommendations, preventive care, screening tests, etc discussed and encouraged; healthy living encouraged; see AVS for patient education given to patient -Discussed importance of 150 minutes of physical activity weekly, eat two servings of fish weekly, eat one serving of tree nuts ( cashews, pistachios, pecans, almonds.Marland Kitchen) every other day, eat 6 servings of  fruit/vegetables daily and drink plenty of water and avoid sweet beverages.

## 2019-05-08 NOTE — Patient Instructions (Addendum)
Ask insurance if they will cover a colonoscopy at age 47, based on new guidelines   Preventive Care 3-80 Years Old, Male Preventive care refers to lifestyle choices and visits with your health care provider that can promote health and wellness. This includes:  A yearly physical exam. This is also called an annual well check.  Regular dental and eye exams.  Immunizations.  Screening for certain conditions.  Healthy lifestyle choices, such as eating a healthy diet, getting regular exercise, not using drugs or products that contain nicotine and tobacco, and limiting alcohol use. What can I expect for my preventive care visit? Physical exam Your health care provider will check:  Height and weight. These may be used to calculate body mass index (BMI), which is a measurement that tells if you are at a healthy weight.  Heart rate and blood pressure.  Your skin for abnormal spots. Counseling Your health care provider may ask you questions about:  Alcohol, tobacco, and drug use.  Emotional well-being.  Home and relationship well-being.  Sexual activity.  Eating habits.  Work and work Statistician. What immunizations do I need?  Influenza (flu) vaccine  This is recommended every year. Tetanus, diphtheria, and pertussis (Tdap) vaccine  You may need a Td booster every 10 years. Varicella (chickenpox) vaccine  You may need this vaccine if you have not already been vaccinated. Zoster (shingles) vaccine  You may need this after age 23. Measles, mumps, and rubella (MMR) vaccine  You may need at least one dose of MMR if you were born in 1957 or later. You may also need a second dose. Pneumococcal conjugate (PCV13) vaccine  You may need this if you have certain conditions and were not previously vaccinated. Pneumococcal polysaccharide (PPSV23) vaccine  You may need one or two doses if you smoke cigarettes or if you have certain conditions. Meningococcal conjugate (MenACWY)  vaccine  You may need this if you have certain conditions. Hepatitis A vaccine  You may need this if you have certain conditions or if you travel or work in places where you may be exposed to hepatitis A. Hepatitis B vaccine  You may need this if you have certain conditions or if you travel or work in places where you may be exposed to hepatitis B. Haemophilus influenzae type b (Hib) vaccine  You may need this if you have certain risk factors. Human papillomavirus (HPV) vaccine  If recommended by your health care provider, you may need three doses over 6 months. You may receive vaccines as individual doses or as more than one vaccine together in one shot (combination vaccines). Talk with your health care provider about the risks and benefits of combination vaccines. What tests do I need? Blood tests  Lipid and cholesterol levels. These may be checked every 5 years, or more frequently if you are over 59 years old.  Hepatitis C test.  Hepatitis B test. Screening  Lung cancer screening. You may have this screening every year starting at age 62 if you have a 30-pack-year history of smoking and currently smoke or have quit within the past 15 years.  Prostate cancer screening. Recommendations will vary depending on your family history and other risks.  Colorectal cancer screening. All adults should have this screening starting at age 52 and continuing until age 19. Your health care provider may recommend screening at age 21 if you are at increased risk. You will have tests every 1-10 years, depending on your results and the type of screening test.  Diabetes screening. This is done by checking your blood sugar (glucose) after you have not eaten for a while (fasting). You may have this done every 1-3 years.  Sexually transmitted disease (STD) testing. Follow these instructions at home: Eating and drinking  Eat a diet that includes fresh fruits and vegetables, whole grains, lean protein,  and low-fat dairy products.  Take vitamin and mineral supplements as recommended by your health care provider.  Do not drink alcohol if your health care provider tells you not to drink.  If you drink alcohol: ? Limit how much you have to 0-2 drinks a day. ? Be aware of how much alcohol is in your drink. In the [place] one drink equals one 12 oz bottle of beer (355 mL), one 5 oz glass of wine (148 mL), or one 1 oz glass of hard liquor (44 mL). Lifestyle  Take daily care of your teeth and gums.  Stay active. Exercise for at least 30 minutes on 5 or more days each week.  Do not use any products that contain nicotine or tobacco, such as cigarettes, e-cigarettes, and chewing tobacco. If you need help quitting, ask your health care provider.  If you are sexually active, practice safe sex. Use a condom or other form of protection to prevent STIs (sexually transmitted infections).  Talk with your health care provider about taking a low-dose aspirin every day starting at age 73. What's next?  Go to your health care provider once a year for a well check visit.  Ask your health care provider how often you should have your eyes and teeth checked.  Stay up to date on all vaccines. This information is not intended to replace advice given to you by your health care provider. Make sure you discuss any questions you have with your health care provider. Document Released: 06/21/2015 Document Revised: 05/19/2018 Document Reviewed: 05/19/2018 Elsevier Patient Education  2020 [employer].

## 2019-05-22 ENCOUNTER — Telehealth: Payer: Self-pay | Admitting: Family Medicine

## 2019-05-22 NOTE — Telephone Encounter (Signed)
Pt called stating the medication for his triglycerides he is still having trouble getting the medication filled out at the pharmacy. Pt states he would not like to continue to fight [employer] and is requesting another medication. Please advise.    [facility] [id number], [place] AT [facility] OF [address]  [place] [address]  Phone: [phone] Fax: [phone]  Not a 24 hour pharmacy; exact hours not known.

## 2019-05-23 ENCOUNTER — Other Ambulatory Visit: Payer: Self-pay | Admitting: Family Medicine

## 2019-05-23 MED ORDER — FENOFIBRATE 145 MG PO TABS: 145.0000 mg | ORAL_TABLET | Freq: Every day | ORAL | 1 refills | Status: DC

## 2019-05-23 NOTE — Telephone Encounter (Signed)
Patient called.  Patient aware.  

## 2019-05-23 NOTE — Telephone Encounter (Signed)
This has had a PA and appeal. Insurance will not approve it. What next.

## 2019-05-26 ENCOUNTER — Other Ambulatory Visit: Payer: Self-pay | Admitting: Family Medicine

## 2019-05-26 DIAGNOSIS — M545 Low back pain, unspecified: Secondary | ICD-10-CM

## 2019-05-26 DIAGNOSIS — R0981 Nasal congestion: Secondary | ICD-10-CM

## 2019-06-15 ENCOUNTER — Other Ambulatory Visit: Payer: Self-pay | Admitting: Family Medicine

## 2019-06-15 DIAGNOSIS — M545 Low back pain, unspecified: Secondary | ICD-10-CM

## 2019-06-15 NOTE — Telephone Encounter (Signed)
Requested medication (s) are due for refill today:yes  Requested medication (s) are on the active medication list: yes  Last refill:  05/29/2019  Future visit scheduled:yes  Notes to clinic:  This refill cannot be delegated    Requested Prescriptions  Pending Prescriptions Disp Refills   tiZANidine (ZANAFLEX) 4 MG tablet [Pharmacy Med Name: TIZANIDINE 4MG  TABLETS] 90 tablet 0    Sig: TAKE 1 TABLET(4 MG) BY MOUTH EVERY 6 HOURS AS NEEDED FOR MUSCLE SPASMS      Not Delegated - Cardiovascular:  Alpha-2 Agonists - tizanidine Failed - 06/15/2019  1:10 PM      Failed - This refill cannot be delegated      Passed - Valid encounter within last 6 months    Recent Outpatient Visits           1 month ago Dyslipidemia   [facility] Steele Sizer, MD   7 months ago Insomnia, unspecified type   [facility] Steele Sizer, MD   12 months ago Muscle strain   [facility] Fredderick Severance, NP   1 year ago Dyslipidemia   [facility] Steele Sizer, MD   1 year ago Acute non-recurrent maxillary sinusitis   [place], NP       Future Appointments             In 4 months Ancil Boozer, Drue Stager, MD [facility], [place]   In 10 months Diamantina Providence, Herbert Seta, [place]

## 2019-10-25 ENCOUNTER — Encounter: Payer: Self-pay | Admitting: Family Medicine

## 2019-10-25 ENCOUNTER — Other Ambulatory Visit: Payer: Self-pay

## 2019-10-25 ENCOUNTER — Ambulatory Visit: Payer: BC Managed Care – PPO | Admitting: Family Medicine

## 2019-10-25 VITALS — BP 120/80 | HR 70 | Temp 97.5°F | Resp 16 | Ht 73.0 in | Wt 204.0 lb

## 2019-10-25 DIAGNOSIS — N401 Enlarged prostate with lower urinary tract symptoms: Secondary | ICD-10-CM

## 2019-10-25 DIAGNOSIS — E785 Hyperlipidemia, unspecified: Secondary | ICD-10-CM

## 2019-10-25 DIAGNOSIS — F411 Generalized anxiety disorder: Secondary | ICD-10-CM

## 2019-10-25 DIAGNOSIS — M545 Low back pain, unspecified: Secondary | ICD-10-CM

## 2019-10-25 DIAGNOSIS — R739 Hyperglycemia, unspecified: Secondary | ICD-10-CM

## 2019-10-25 DIAGNOSIS — G4700 Insomnia, unspecified: Secondary | ICD-10-CM

## 2019-10-25 DIAGNOSIS — N138 Other obstructive and reflux uropathy: Secondary | ICD-10-CM

## 2019-10-25 NOTE — Progress Notes (Addendum)
Name: Gilbert Buchanan   MRN: [id number]    DOB: [birth year]   Date:10/25/2019       Progress Note  Subjective  Chief Complaint  Chief Complaint  Patient presents with  . Anxiety  . Insomnia  . Hyperglycemia  . Dyslipidemia    HPI  GAD: he also has OCD,and history of ADD as a child ( but could not tolerate medication)he has tried multiple medications in the past - but states he could not tolerate the side effects of medications, he is off alprazolam, he also stopped Buspar  Denies any panic attacks, states has difficultyfalling asleep at nightbut Trazodone half pill prn helps him fall asleep. Denies depression or suicidal thoughts. He said he was tested for ADD in HS and Ritalin made him feel worse.He stopped Wellbutrin , he states he did not noticed a change in his mood or OCD and prefers not taking medication Discussed mindfulness.   Hyperlipidemia: he took  Vascepa for over 3 years but he had  he had to switch to Lovaza because of insurance, he  changed his diet since Fall of 2016. He stopped drinking beer ( Used to drink 20-30 beers on weekends) ,started to eat a high protein, high fiber, low carb and low sugar, except that not as consistent with diet lately.We had to change to fenofibrate but he was not able to tolerate medication. We will recheck labs and try PA if needed for Vascepa  Obesity: he was obese since childhood. He lost a lot of weight since fall 2016, doing well, his weight has been stable now, around 208lbs over the past 6 months, he is back at 200 lbs at home, today in our office it was 204 lbs.   [id number] the care of Urologist,however no PSA in over one year, he skipped flomax for a couple weeks and did not noticed that it got worse, however he had to go to the beach and resumed medication. He states after one month symptoms seems to return   Intermittent low back pain: he had severe symptoms of back pain with right lower leg radiculitis and right foot drop  in 2017, he did not have MRI because of cost and went to chiropractor, he is doing well now, occasionally has back pain but no need for medications in a long time   Patient Active Problem List   Diagnosis Date Noted  . 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 Baker's cyst of knee, right 12/25/2016  . Dyslipidemia 11/11/2015  . Hyperglycemia 11/11/2015  . Chronic constipation 11/11/2015  . GAD (generalized anxiety disorder) 11/11/2015  . Prostatitis 05/05/2015  . 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 HERNIA REPAIR    . VARICOCELE EXCISION      Family History  Problem Relation Age of Onset  . Kidney disease Mother   . Testicular cancer Father   . Polycystic kidney disease Other   . Heart disease Maternal Grandmother   . Stroke Paternal Grandmother   . Stroke Paternal Grandfather   . Prostate cancer Neg Hx     Social History   Tobacco Use  . Smoking status: Former Smoker    Packs/day: 1.00    Years: 25.00    Pack years: 25.00    Types: Cigarettes, Cigars    Start date: 06/09/1987    Quit date: 06/08/2012    Years since quitting: 7.3  . Smokeless tobacco: Former Systems developer  Types: Snuff    Quit date: 05/07/2017  . Tobacco comment: occasionally cigars  Substance Use Topics  . Alcohol use: Yes    Alcohol/week: 0.0 standard drinks     Current Outpatient Medications:  .  fluticasone (FLONASE) 50 MCG/ACT nasal spray, SPRAY 2 SPRAYS INTO EACH NOSTRIL EVERY DAY, Disp: 48 mL, Rfl: 1 .  Multiple Vitamin (MULTIVITAMIN) tablet, Take 1 tablet by mouth daily., Disp: , Rfl:  .  tamsulosin (FLOMAX) 0.4 MG CAPS capsule, Take 1 capsule (0.4 mg total) by mouth daily., Disp: 90 capsule, Rfl: 3 .  traZODone (DESYREL) 50 MG tablet, Take 0.5-1 tablets (25-50 mg total) by mouth at bedtime., Disp: 90 tablet, Rfl: 1 .  tiZANidine (ZANAFLEX) 4 MG tablet, TAKE 1 TABLET(4 MG) BY MOUTH EVERY 6 HOURS AS NEEDED  FOR MUSCLE SPASMS (Patient not taking: Reported on 10/25/2019), Disp: 90 tablet, Rfl: 0  Allergies  Allergen Reactions  . Acyclovir And Related     I personally reviewed active problem list, medication list, allergies, family history, social history, health maintenance with the patient/caregiver today.   ROS  Constitutional: Negative for fever or weight change.  Respiratory: Negative for cough and shortness of breath.   Cardiovascular: Negative for chest pain or palpitations.  Gastrointestinal: Negative for abdominal pain, no bowel changes.  Musculoskeletal: Negative for gait problem or joint swelling.  Skin: Negative for rash.  Neurological: Negative for dizziness or headache.  No other specific complaints in a complete review of systems (except as listed in HPI above).  Objective  Vitals:   10/25/19 1558  BP: 120/80  Pulse: 70  Resp: 16  Temp: (!) 97.5 F (36.4 C)  TempSrc: Temporal  SpO2: 98%  Weight: 204 lb (92.5 kg)  Height: 6\' 1"  (1.854 m)    Body mass index is 26.91 kg/m.  Physical Exam  Constitutional: Patient appears well-developed and well-nourished. No distress.  HEENT: head atraumatic, normocephalic, pupils equal and reactive to light, neck supple Cardiovascular: Normal rate, regular rhythm and normal heart sounds.  No murmur heard. No BLE edema. Pulmonary/Chest: Effort normal and breath sounds normal. No respiratory distress. Abdominal: Soft.  There is no tenderness. Psychiatric: Patient has a normal mood and affect. behavior is normal. Judgment and thought content normal.  PHQ2/9: Depression screen [facility] 2/9 10/25/2019 10/25/2019 05/08/2019 11/01/2018 06/20/2018  Decreased Interest 0 0 0 0 0  Down, Depressed, Hopeless 0 0 0 0 0  PHQ - 2 Score 0 0 0 0 0  Altered sleeping 2 0 0 0 0  Tired, decreased energy 0 0 0 0 0  Change in appetite 0 0 0 0 0  Feeling bad or failure about yourself  0 0 0 0 0  Trouble concentrating 0 0 0 0 0  Moving slowly or  fidgety/restless 0 0 0 0 0  Suicidal thoughts 0 0 0 0 0  PHQ-9 Score 2 0 0 0 0  Difficult doing work/chores - - Not difficult at all - Not difficult at all  Some recent data might be hidden    phq 9 is negative   Fall Risk: Fall Risk  10/25/2019 05/08/2019 11/01/2018 06/20/2018 05/02/2018  Falls in the past year? 0 0 0 0 0  Number falls in past yr: 0 0 0 0 0  Injury with Fall? 0 0 0 0 0     Functional Status Survey: Is the patient deaf or have difficulty hearing?: No Does the patient have difficulty seeing, even when wearing glasses/contacts?: No Does the patient  have difficulty concentrating, remembering, or making decisions?: No Does the patient have difficulty walking or climbing stairs?: No Does the patient have difficulty dressing or bathing?: No Does the patient have difficulty doing errands alone such as visiting a doctor's office or shopping?: No    Assessment & Plan  1. GAD (generalized anxiety disorder)  Off medication   2. BPH with obstruction/lower urinary tract symptoms  - PSA  3. Hyperglycemia  - Comprehensive metabolic panel - Hemoglobin A1c  4. Dyslipidemia  - Lipid panel   5. Intermittent low back pain  Stable   6. Insomnia, unspecified type  Doing well

## 2019-10-25 NOTE — Addendum Note (Signed)
Addended by: Obaloluwa Delatte, Ulla Potash on: 10/25/2019 04:59 PM   Modules accepted: Orders

## 2019-11-10 DIAGNOSIS — N138 Other obstructive and reflux uropathy: Secondary | ICD-10-CM | POA: Diagnosis not present

## 2019-11-10 DIAGNOSIS — R739 Hyperglycemia, unspecified: Secondary | ICD-10-CM | POA: Diagnosis not present

## 2019-11-10 DIAGNOSIS — E785 Hyperlipidemia, unspecified: Secondary | ICD-10-CM | POA: Diagnosis not present

## 2019-11-10 DIAGNOSIS — N401 Enlarged prostate with lower urinary tract symptoms: Secondary | ICD-10-CM | POA: Diagnosis not present

## 2019-11-11 LAB — HEMOGLOBIN A1C
Hgb A1c MFr Bld: 4.9 % of total Hgb (ref <5.7)
Mean Plasma Glucose: 94 (calc)
eAG (mmol/L): 5.2 (calc)

## 2019-11-11 LAB — COMPREHENSIVE METABOLIC PANEL
AG Ratio: 2 (calc) (ref 1.0–2.5)
ALT: 22 U/L (ref 9–46)
AST: 21 U/L (ref 10–40)
Albumin: 4.3 g/dL (ref 3.6–5.1)
Alkaline phosphatase (APISO): 35 U/L — ABNORMAL LOW (ref 36–130)
BUN: 20 mg/dL (ref 7–25)
CO2: 31 mmol/L (ref 20–32)
Calcium: 9.3 mg/dL (ref 8.6–10.3)
Chloride: 103 mmol/L (ref 98–110)
Creat: 1.03 mg/dL (ref 0.60–1.35)
Globulin: 2.1 g/dL (calc) (ref 1.9–3.7)
Glucose, Bld: 104 mg/dL — ABNORMAL HIGH (ref 65–99)
Potassium: 4.3 mmol/L (ref 3.5–5.3)
Sodium: 140 mmol/L (ref 135–146)
Total Bilirubin: 0.7 mg/dL (ref 0.2–1.2)
Total Protein: 6.4 g/dL (ref 6.1–8.1)

## 2019-11-11 LAB — LIPID PANEL
Cholesterol: 230 mg/dL — ABNORMAL HIGH (ref <200)
HDL: 46 mg/dL (ref >=40)
LDL Cholesterol (Calc): 156 mg/dL (calc) — ABNORMAL HIGH
Non-HDL Cholesterol (Calc): 184 mg/dL (calc) — ABNORMAL HIGH (ref <130)
Total CHOL/HDL Ratio: 5 (calc) — ABNORMAL HIGH (ref <5.0)
Triglycerides: 150 mg/dL — ABNORMAL HIGH (ref <150)

## 2019-11-11 LAB — PSA: PSA: 1 ng/mL (ref <=4.0)

## 2020-01-15 ENCOUNTER — Other Ambulatory Visit: Payer: Self-pay | Admitting: Family Medicine

## 2020-01-15 DIAGNOSIS — R0981 Nasal congestion: Secondary | ICD-10-CM

## 2020-01-18 ENCOUNTER — Other Ambulatory Visit: Payer: Self-pay | Admitting: Family Medicine

## 2020-01-23 DIAGNOSIS — Z03818 Encounter for observation for suspected exposure to other biological agents ruled out: Secondary | ICD-10-CM | POA: Diagnosis not present

## 2020-01-23 DIAGNOSIS — Z20822 Contact with and (suspected) exposure to covid-19: Secondary | ICD-10-CM | POA: Diagnosis not present

## 2020-01-23 DIAGNOSIS — U071 COVID-19: Secondary | ICD-10-CM | POA: Diagnosis not present

## 2020-01-29 DIAGNOSIS — U071 COVID-19: Secondary | ICD-10-CM | POA: Diagnosis not present

## 2020-01-29 DIAGNOSIS — Z03818 Encounter for observation for suspected exposure to other biological agents ruled out: Secondary | ICD-10-CM | POA: Diagnosis not present

## 2020-01-29 DIAGNOSIS — Z20822 Contact with and (suspected) exposure to covid-19: Secondary | ICD-10-CM | POA: Diagnosis not present

## 2020-01-31 ENCOUNTER — Ambulatory Visit: Payer: Self-pay | Admitting: *Deleted

## 2020-01-31 ENCOUNTER — Telehealth (INDEPENDENT_AMBULATORY_CARE_PROVIDER_SITE_OTHER): Payer: BC Managed Care – PPO | Admitting: Internal Medicine

## 2020-01-31 ENCOUNTER — Encounter: Payer: Self-pay | Admitting: Internal Medicine

## 2020-01-31 ENCOUNTER — Other Ambulatory Visit: Payer: Self-pay

## 2020-01-31 VITALS — Temp 97.8°F | Ht 74.0 in | Wt 197.0 lb

## 2020-01-31 DIAGNOSIS — H9201 Otalgia, right ear: Secondary | ICD-10-CM | POA: Diagnosis not present

## 2020-01-31 DIAGNOSIS — U071 COVID-19: Secondary | ICD-10-CM | POA: Insufficient documentation

## 2020-01-31 NOTE — Telephone Encounter (Signed)
Pt has an appt this afternoon with Dr Roxan Hockey

## 2020-01-31 NOTE — Telephone Encounter (Signed)
C/o headache and pressure with ear pain and pressure. Cough and fever 101 at night and decreases during the day since Sunday. Patient requesting medication for head and ear pressure. Patient reports he has had ear infections in the past and requesting PCP to be notified of best choice of medication to take. Care advise given and isolation precautions reviewed. No available virtual visits available until September. Patient verbalized understanding of care advise and to go to urgent care or ED if symptoms worsen. Please advise.  Reason for Disposition  [1] PERSISTING SYMPTOMS OF COVID-19 AND [2] NEW symptom AND [3] that sounds mild  Answer Assessment - Initial Assessment Questions 1. COVID-19 ONSET: "When did the symptoms of COVID-19 first start?"     Sunday  2. DIAGNOSIS CONFIRMATION: "How were you diagnosed?" (e.g., COVID-19 oral or nasal viral test; COVID-19 antibody test; doctor visit)     [facility]  3. MAIN SYMPTOM:  "What is your main concern or symptom right now?" (e.g., breathing difficulty, cough, fatigue. loss of smell)     Head pressure and ear pressure 4. SYMPTOM ONSET: "When did the  Head and ear pressure  start?"     sunday 5. BETTER-SAME-WORSE: "Are you getting better, staying the same, or getting worse over the last 1 to 2 weeks?"     worse 6. RECENT MEDICAL VISIT: "Have you been seen by a healthcare provider (doctor, NP, PA) for these persisting COVID-19 symptoms?" If Yes, ask: "When were you seen?" (e.g., date)     no 7. COUGH: "Do you have a cough?" If Yes, ask: "How bad is the cough?"       Yes  8. FEVER: "Do you have a fever?" If Yes, ask: "What is your temperature, how was it measured, and when did it start?"     Fever 101 at night and decreased during the day 9. BREATHING DIFFICULTY: "Are you having any trouble breathing?" If Yes, ask: "How bad is your breathing?" (e.g., mild, moderate, severe)    - MILD: No SOB at rest, mild SOB with walking, speaks normally in sentences,  can lay down, no retractions, pulse < 100.    - MODERATE: SOB at rest, SOB with minimal exertion and prefers to sit, cannot lie down flat, speaks in phrases, mild retractions, audible wheezing, pulse 100-120.    - SEVERE: Very SOB at rest, speaks in single words, struggling to breathe, sitting hunched forward, retractions, pulse > 120       No  10. HIGH RISK DISEASE: "Do you have any chronic medical problems?" (e.g., asthma, heart or lung disease, weak immune system, obesity, etc.)       no  12. OTHER SYMPTOMS: "Do you have any other symptoms?"  (e.g., fatigue, headache, muscle pain, weakness)       Headache, ear pain, fever  Protocols used: CORONAVIRUS (COVID-19) PERSISTING SYMPTOMS FOLLOW-UP CALL-A-AH

## 2020-01-31 NOTE — Progress Notes (Signed)
Name: Gilbert Buchanan   MRN: [id number]    DOB: 1970/09/23   Date:01/31/2020       Progress Note  Subjective  Chief Complaint  Chief Complaint  Patient presents with  . Fever    Covid Positive, tested on Monday 01/29/20; Symptoms began on Sunday 01/28/20, fever is more frequent at night  . Cough    Taking dayquil, nyquil and mucinex for treatment currently  . Ear Pain    Right ear seems to be more painful  . Nasal Congestion  . Generalized Body Aches    I connected with  Gilbert Buchanan on 01/31/20 at  2:20 PM EDT by telephone and verified that I am speaking with the correct person using two identifiers.  I discussed the limitations, risks, security and privacy concerns of performing an evaluation and management service by telephone and the availability of in person appointments. The patient expressed understanding and agreed to proceed. Staff also discussed with the patient that there may be a patient responsible charge related to this service. Patient Location: Home Provider Location: [facility] Additional Individuals present: none The call was disrupted midway through after reviewing his symptoms.  My first attempt to return the call was left with a voice message system, as was the second attempt.  On the third attempt, approximately 10 minutes after, I left a voicemail for him to return the call to the office and hopefully we can pick up where we left off. He did return the call a few minutes later and we were able to finish  HPI Patient is a 49 year old male patient of Dr. Ancil Boozer Tested positive for Covid on Monday 8/23 with symptoms beginning the day prior. Dx'ed at [place]. His daughter had it last week. Follows up today by phone visit.  He notes he is still having fevers, more frequent at night, still coughing and taking DayQuil and NyQuil products as well as a Mucinex product to try to help, notes his ear is more painful on the right, and still has some nasal congestion and  generalized body aches.  + cough, no marked production, not that bad No marked SOB + fever, at night, 101 No sore throat.  + congestion Not marked loss of smell, loss of taste, mild decrease with congestion No  N/V + muscle aches, back + marked loose stools/diarrhea No CP,  passing out episodes  Comorbid conditions reviewed No asthma/COPD hx,  No h/o DM, heart disease, CKD, morbid obesity  Ear ache seems to be the most problematic part, right ear, more annoying than painful, no swollen glands in the neck, just feels like he is getting an ear infection again Had swollen glands that were tender last Monday and was negative last week for Covid when tested, that got better and then Sunday Buchanan worse and he did an at home test which was positive, and then went the next day to [facility] and was tested and was positive.  I did note sometimes the test can be negative if tested too early, and that is when our conversation was cut off. Every few years gets and ear infection.   Patient Active Problem List   Diagnosis Date Noted  . 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 Baker's cyst of knee, right 12/25/2016  . Dyslipidemia 11/11/2015  . Hyperglycemia 11/11/2015  . Chronic constipation 11/11/2015  . GAD (generalized anxiety disorder) 11/11/2015  . Prostatitis 05/05/2015  . 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 HERNIA REPAIR    . VARICOCELE EXCISION      Family History  Problem Relation Age of Onset  . Kidney disease Mother   . Testicular cancer Father   . Polycystic kidney disease Other   . Heart disease Maternal Grandmother   . Stroke Paternal Grandmother   . Stroke Paternal Grandfather   . Prostate cancer Neg Hx     Social History   Tobacco Use  . Smoking status: Former Smoker    Packs/day: 1.00    Years: 25.00    Pack years: 25.00    Types: Cigarettes, Cigars    Start date:  06/09/1987    Quit date: 06/08/2012    Years since quitting: 7.6  . Smokeless tobacco: Former Systems developer    Types: Snuff    Quit date: 05/07/2017  . Tobacco comment: occasionally cigars  Substance Use Topics  . Alcohol use: Yes    Alcohol/week: 0.0 standard drinks     Current Outpatient Medications:  .  fluticasone (FLONASE) 50 MCG/ACT nasal spray, SPRAY 2 SPRAYS INTO EACH NOSTRIL EVERY DAY, Disp: 48 mL, Rfl: 1 .  Multiple Vitamin (MULTIVITAMIN) tablet, Take 1 tablet by mouth daily., Disp: , Rfl:  .  tamsulosin (FLOMAX) 0.4 MG CAPS capsule, Take 1 capsule (0.4 mg total) by mouth daily., Disp: 90 capsule, Rfl: 3 .  tiZANidine (ZANAFLEX) 4 MG tablet, TAKE 1 TABLET(4 MG) BY MOUTH EVERY 6 HOURS AS NEEDED FOR MUSCLE SPASMS (Patient not taking: Reported on 10/25/2019), Disp: 90 tablet, Rfl: 0 .  traZODone (DESYREL) 50 MG tablet, Take 0.5-1 tablets (25-50 mg total) by mouth at bedtime., Disp: 90 tablet, Rfl: 1  Allergies  Allergen Reactions  . Acyclovir And Related     With staff assistance, above reviewed with the patient today.  ROS: As per HPI, otherwise no specific complaints on a limited and focused system review   Objective  Virtual encounter, vitals not obtained.  Body mass index is 25.29 kg/m.  Physical Exam   Appears in NAD via conversation Breathing: No obvious respiratory distress. Speaking in complete sentences Neurological: Pt is alert, Speech is normal Psychiatric: Patient has a normal mood and affect. Judgment and thought content normal.   No results found for this or any previous visit (from the past 72 hour(s)).  PHQ2/9: Depression screen [facility] 2/9 01/31/2020 10/25/2019 10/25/2019 05/08/2019 11/01/2018  Decreased Interest 0 0 0 0 0  Down, Depressed, Hopeless 0 0 0 0 0  PHQ - 2 Score 0 0 0 0 0  Altered sleeping - 2 0 0 0  Tired, decreased energy - 0 0 0 0  Change in appetite - 0 0 0 0  Feeling bad or failure about yourself  - 0 0 0 0  Trouble concentrating - 0 0 0 0    Moving slowly or fidgety/restless - 0 0 0 0  Suicidal thoughts - 0 0 0 0  PHQ-9 Score - 2 0 0 0  Difficult doing work/chores - - - Not difficult at all -  Some recent data might be hidden   PHQ-2/9 Result reviewed  Fall Risk: Fall Risk  01/31/2020 10/25/2019 05/08/2019 11/01/2018 06/20/2018  Falls in the past year? 0 0 0 0 0  Number falls in past yr: 0 0 0 0 0  Injury with Fall? 0 0 0 0 0     Assessment & Plan  1. COVID-19  Educated on Covid and management recommendations, monoclonal antibody  treatments entertained for non-hospitalized patients at risk for severe disease within 10 days of symptom onset, and otherwise no Covid specific therapy to prescribe in the ambulatory setting.  He noted he was apparently aware of this and just helped his daughter through her Covid diagnosis and symptoms. Recommend continued treatment and symptomatic measures - stay well hydrated, rest, continue symptomatic measures for the  mild cough/congestion and noted to not take extra Tylenol (acetaminophen) if taking the DayQuil/NyQuil products as they often have acetaminophen in them for low grade temps/fevers/aches.  Can use over-the-counter ibuprofen products, take with food to help as needed Notes he is currently isolated presently and will continue until recovered  2. Ear pain, right Discussed the challenges on a phone visit in diagnosing ear infections, and he noted it is more annoying and painful presently.  Mutually agreed to continue symptomatic measures for the next 24 to 48 hours and monitor.  If your symptoms are worsening, informed to call the office and asked to talk to [facility] who will let me know, and will prescribe an antibiotic to start and he will be able to pick that up at his pharmacy.  Noted ideally, not having to start the antibiotic and noted reasons why, although await his response to symptomatic measures presently.  I discussed the assessment and treatment plan with the patient. The  patient was provided an opportunity to ask questions and all were answered. The patient agreed with the plan and demonstrated an understanding of the instructions.   The patient was advised to call back or seek an in-person evaluation if the symptoms worsen or if the condition fails to improve as anticipated.  I provided 15 minutes of non-face-to-face time during this encounter that included discussing at length patient's sx/history, pertinent pmhx, medications, treatment and follow up plan. This time also included the necessary documentation, orders, and chart review.  Towanda Malkin, MD

## 2020-02-01 ENCOUNTER — Telehealth: Payer: Self-pay | Admitting: Family Medicine

## 2020-02-01 ENCOUNTER — Other Ambulatory Visit: Payer: Self-pay | Admitting: Internal Medicine

## 2020-02-01 ENCOUNTER — Telehealth: Payer: Self-pay

## 2020-02-01 DIAGNOSIS — H9201 Otalgia, right ear: Secondary | ICD-10-CM

## 2020-02-01 MED ORDER — AMOXICILLIN 500 MG PO CAPS: 500.0000 mg | ORAL_CAPSULE | Freq: Three times a day (TID) | ORAL | 0 refills | Status: DC

## 2020-02-01 NOTE — Telephone Encounter (Signed)
Sent a prescription for amoxicillin 3 times daily to his pharmacy.

## 2020-02-01 NOTE — Telephone Encounter (Signed)
Patient called to say he ears are still painful, he would like antibiotic sent to pharmacy. Pharmacy is [facility] [place], [address]

## 2020-02-01 NOTE — Telephone Encounter (Signed)
Patient last seen PCP 8/25 for ear pain and states PCP advised if symptoms did not improve antibiotics would be sent in today. Patient experiencing ear discomfort in both ears. Please advise patient when rx is sent  [id number] [place], [address]  [address] [place] [address]  Phone: [phone] Fax: [phone]  Hours: Not open 24 hours

## 2020-02-01 NOTE — Telephone Encounter (Signed)
This pt was seen by Dr Roxan Hockey yesterday

## 2020-02-01 NOTE — Progress Notes (Signed)
Patient called today noting his ear pain was more concerning, and requested an antibiotic be called in for him.  We did have a phone visit yesterday. Felt reasonable to start on amoxicillin product 3 times daily and assess his response.

## 2020-02-01 NOTE — Telephone Encounter (Signed)
Patient informed of prescription sent to patient's pharmacy.

## 2020-02-01 NOTE — Telephone Encounter (Signed)
Pt called in very upset that the Rx for an antibiotic has not been sent to his pharmacy. Pt stated he needs the Rx sent asap.

## 2020-02-01 NOTE — Telephone Encounter (Signed)
Pt called back in to follow up on request for a antibiotic for his ears. Pt says that his ear ache is getting worse.    Please assist.

## 2020-02-14 ENCOUNTER — Ambulatory Visit: Payer: BC Managed Care – PPO | Admitting: Internal Medicine

## 2020-02-14 ENCOUNTER — Other Ambulatory Visit: Payer: Self-pay

## 2020-02-14 ENCOUNTER — Ambulatory Visit: Payer: Self-pay | Admitting: Family Medicine

## 2020-02-14 ENCOUNTER — Encounter: Payer: Self-pay | Admitting: Internal Medicine

## 2020-02-14 VITALS — BP 124/78 | HR 69 | Temp 97.9°F | Resp 16 | Ht 73.0 in | Wt 203.7 lb

## 2020-02-14 DIAGNOSIS — R519 Headache, unspecified: Secondary | ICD-10-CM | POA: Diagnosis not present

## 2020-02-14 DIAGNOSIS — S060X0A Concussion without loss of consciousness, initial encounter: Secondary | ICD-10-CM | POA: Diagnosis not present

## 2020-02-14 DIAGNOSIS — U071 COVID-19: Secondary | ICD-10-CM | POA: Diagnosis not present

## 2020-02-14 NOTE — Patient Instructions (Addendum)
You can take Tylenol as needed for headache symptoms presently.  Continue to closely monitor as we discussed   Concussion, Adult  A concussion is a brain injury from a hard, direct hit (trauma) to your head or body. This direct hit causes the brain to quickly shake back and forth inside the skull. A concussion may also be called a mild traumatic brain injury (TBI). Healing from this injury can take time. What are the causes? This condition is caused by:  A direct hit to your head, such as: ? Running into a player during a game. ? Being hit in a fight. ? Hitting your head on a hard surface.  A quick and sudden movement (jolt) of the head or neck, such as in a car crash. What are the signs or symptoms? The signs of a concussion can be hard to notice. They may be missed by you, family members, and doctors. You may look fine on the outside but may not act or feel normal. Physical symptoms  Headaches.  Being tired (fatigued).  Being dizzy.  Problems with body balance.  Problems seeing or hearing.  Being sensitive to light or noise.  Feeling sick to your stomach (nausea) or throwing up (vomiting).  Not sleeping or eating as you used to.  Loss of feeling (numbness) or tingling in the body.  Seizure. Mental and emotional symptoms  Problems remembering things.  Trouble focusing your mind (concentrating), organizing, or making decisions.  Being slow to think, act, react, speak, or read.  Feeling grouchy (irritable).  Having mood changes.  Feeling worried or nervous (anxious).  Feeling sad (depressed). How is this treated? This condition may be treated by:  Stopping sports or activity if you are injured. If you hit your head or have signs of concussion: ? Do not return to sports or activities the same day. ? Get checked by a doctor before you return to your activities.  Resting your body and your mind.  Being watched carefully, often at home.  Medicines to help  with symptoms such as: ? Feeling sick to your stomach. ? Headaches. ? Problems with sleep.  Avoid taking strong pain medicines (opioids) for a concussion.  Avoiding alcohol and drugs.  Being asked to go to a concussion clinic or a place to help you recover (rehabilitation center). Recovery from a concussion can take time. Return to activities only:  When you are fully healed.  When your doctor says it is safe. Follow these instructions at home: Activity  Limit activities that need a lot of thought or focus, such as: ? Homework or work for your job. ? Watching TV. ? Using the computer or phone. ? Playing memory games and puzzles.  Rest. Rest helps your brain heal. Make sure you: ? Get plenty of sleep. Most adults should get 7-9 hours of sleep each night. ? Rest during the day. Take naps or breaks when you feel tired.  Avoid activity like exercise until your doctor says its safe. Stop any activity that makes symptoms worse.  Do not do activities that could cause a second concussion, such as riding a bike or playing sports.  Ask your doctor when you can return to your normal activities, such as school, work, sports, and driving. Your ability to react may be slower. Do not do these activities if you are dizzy. General instructions   Take over-the-counter and prescription medicines only as told by your doctor.  Do not drink alcohol until your doctor says you can.  Watch your symptoms and tell other people to do the same. Other problems can occur after a concussion. Older adults have a higher risk of serious problems.  Tell your work Freight forwarder, teachers, Government social research officer, school counselor, coach, or Product/process development scientist about your injury and symptoms. Tell them about what you can or cannot do.  Keep all follow-up visits as told by your doctor. This is important. How is this prevented?  It is very important that you do not get another brain injury. In rare cases, another injury can  cause brain damage that will not go away, brain swelling, or death. The risk of this is greatest in the first 7-10 days after a head injury. To avoid injuries: ? Stop activities that could lead to a second concussion, such as contact sports, until your doctor says it is okay. ? When you return to sports or activities:  Do not crash into other players. This is how most concussions happen.  Follow the rules.  Respect other players. ? Get regular exercise. Do strength and balance training. ? Wear a helmet that fits you well during sports, biking, or other activities.  Helmets can help protect you from serious skull and brain injuries, but they do not protect you from a concussion. Even when wearing a helmet, you should avoid being hit in the head. Contact a doctor if:  Your symptoms get worse or they do not get better.  You have new symptoms.  You have another injury. Get help right away if:  You have bad headaches or your headaches get worse.  You feel weak or numb in any part of your body.  You are mixed up (confused).  Your balance gets worse.  You keep throwing up.  You feel more sleepy than normal.  Your speech is not clear (is slurred).  You cannot recognize people or places.  You have a seizure.  Others have trouble waking you up.  You have behavior changes.  You have changes in how you see (vision).  You pass out (lose consciousness). Summary  A concussion is a brain injury from a hard, direct hit (trauma) to your head or body.  This condition is treated with rest and careful watching of symptoms.  If you keep having symptoms, call your doctor. This information is not intended to replace advice given to you by your health care provider. Make sure you discuss any questions you have with your health care provider. Document Revised: 01/13/2018 Document Reviewed: 01/13/2018 Elsevier Patient Education  [employer].

## 2020-02-14 NOTE — Progress Notes (Signed)
Patient ID: Gilbert Buchanan, male    DOB: 01-Sep-1970, 49 y.o.   MRN: [id number]  PCP: Steele Sizer, MD  Chief Complaint  Patient presents with  . Head Injury    fell off golf cart 02/11/20 hit his head, friend that is a nurse checked him out he wanted to be sure everything is ok    Subjective:   Gilbert Buchanan is a 49 y.o. male, presents to clinic with CC of the following:  Chief Complaint  Patient presents with  . Head Injury    fell off golf cart 02/11/20 hit his head, friend that is a nurse checked him out he wanted to be sure everything is ok    HPI:  Patient is a 49 year old male patient of Dr. Jesse Fall off his golf cart 02/11/2020, and hit his head.  He had a friend who is a nurse that checked him out and follows up today to be reassured everything is good.  Patient was diagnosed with Covid on 01/29/2020, with his symptoms pretty much improved before this past Labor Day weekend holiday, except for him noting this pressure type feeling in his sinuses and behind his eyes that has persisted since having Covid.    Then this past Sunday night, he was standing on the back of a cough cart and with a sudden stop, fell off of the back and landed on his left back and hit the back of his head pretty hard.  He was at a [employer].  He had no loss of consciousness, stated he stood back up and felt fine.  He noted he hung out with his wife for the next 2 hours, and then went to bed without incident.  He denied any marked headaches at that point. He noted that the symptoms that he was having post Covid , the pressure feeling in his head, mostly behind the sinuses and his eyes persisted, but denied other more concerning headaches.  He developed a small swelling where he hit the back of his head, although denied any big hematoma concern, and after icing that area, it went down very quickly.  He denied any vision changes, no nausea or vomiting, no one-sided symptoms of concern.  Denied any  fevers.  Denied any problems with his thought processes, and has returned to work and had a very productive day at work noted yesterday.  Denied forgetting things.  No problems peeing with no blood in the urine, no abdominal pains. He has no history of major concussion in his past. Felt best to come get checked presently. Has not been taking anything for his pressure symptoms since the head injury, and was taking Advil products intermittently previous to that.    Patient Active Problem List   Diagnosis Date Noted  . COVID-19 01/31/2020  . 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 Baker's cyst of knee, right 12/25/2016  . Dyslipidemia 11/11/2015  . Hyperglycemia 11/11/2015  . Chronic constipation 11/11/2015  . GAD (generalized anxiety disorder) 11/11/2015  . Prostatitis 05/05/2015  . 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  fluticasone (FLONASE) 50 MCG/ACT nasal spray, SPRAY 2 SPRAYS INTO EACH NOSTRIL EVERY DAY, Disp: 48 mL, Rfl: 1 .  Multiple Vitamin (MULTIVITAMIN) tablet, Take 1 tablet by mouth daily., Disp: , Rfl:  .  tamsulosin (FLOMAX) 0.4 MG CAPS capsule, Take 1 capsule (0.4 mg total) by mouth daily., Disp: 90 capsule, Rfl:  3 .  tiZANidine (ZANAFLEX) 4 MG tablet, TAKE 1 TABLET(4 MG) BY MOUTH EVERY 6 HOURS AS NEEDED FOR MUSCLE SPASMS, Disp: 90 tablet, Rfl: 0 .  traZODone (DESYREL) 50 MG tablet, Take 0.5-1 tablets (25-50 mg total) by mouth at bedtime., Disp: 90 tablet, Rfl: 1   Allergies  Allergen Reactions  . Acyclovir And Related      Past Surgical History:  Procedure Laterality Date  . HERNIA REPAIR    . VARICOCELE EXCISION       Family History  Problem Relation Age of Onset  . Kidney disease Mother   . Testicular cancer Father   . Polycystic kidney disease Other   . Heart disease Maternal Grandmother   . Stroke Paternal Grandmother   . Stroke Paternal Grandfather   . Prostate cancer Neg  Hx      Social History   Tobacco Use  . Smoking status: Former Smoker    Packs/day: 1.00    Years: 25.00    Pack years: 25.00    Types: Cigarettes, Cigars    Start date: 06/09/1987    Quit date: 06/08/2012    Years since quitting: 7.6  . Smokeless tobacco: Former Systems developer    Types: Snuff    Quit date: 05/07/2017  . Tobacco comment: occasionally cigars  Substance Use Topics  . Alcohol use: Yes    Alcohol/week: 0.0 standard drinks    With staff assistance, above reviewed with the patient today.  ROS: As per HPI, otherwise no specific complaints on a limited and focused system review   No results found for this or any previous visit (from the past 72 hour(s)).   PHQ2/9: Depression screen [facility] 2/9 02/14/2020 01/31/2020 10/25/2019 10/25/2019 05/08/2019  Decreased Interest 0 0 0 0 0  Down, Depressed, Hopeless 0 0 0 0 0  PHQ - 2 Score 0 0 0 0 0  Altered sleeping - - 2 0 0  Tired, decreased energy - - 0 0 0  Change in appetite - - 0 0 0  Feeling bad or failure about yourself  - - 0 0 0  Trouble concentrating - - 0 0 0  Moving slowly or fidgety/restless - - 0 0 0  Suicidal thoughts - - 0 0 0  PHQ-9 Score - - 2 0 0  Difficult doing work/chores - - - - Not difficult at all  Some recent data might be hidden   PHQ-2/9 Result is neg  Fall Risk: Fall Risk  02/14/2020 01/31/2020 10/25/2019 05/08/2019 11/01/2018  Falls in the past year? 0 0 0 0 0  Number falls in past yr: 0 0 0 0 0  Injury with Fall? 0 0 0 0 0  Follow up Falls evaluation completed - - - -      Objective:   Vitals:   02/14/20 1422  BP: 124/78  Pulse: 69  Resp: 16  Temp: 97.9 F (36.6 C)  TempSrc: Oral  SpO2: 98%  Weight: 203 lb 11.2 oz (92.4 kg)  Height: 6\' 1"  (1.854 m)    Body mass index is 26.87 kg/m.  Physical Exam   NAD, masked HEENT - [place]/AT, no marked swelling or hematoma concern in the right septal region where he states he hit his head, no abrasion, sclera anicteric, PERRL, EOMI without nystagmus,  visual fields grossly intact, conj - non-inj'ed, TM's and canals clear, hearing grossly intact in the office with rubbing fingers beside each ear heard equally bilaterally, pharynx clear Neck - supple, no adenopathy, nontender over  the cervical spine with palpation, good neck motion Car - RRR without m/g/r Pulm- RR and effort normal at rest, CTA without wheeze or rales Abd - soft, NT diffusely,, ND,  Back - no CVA tenderness Ext - no LE edema,  Neuro/psychiatric - affect was not flat, appropriate with conversation  Cranial nerves II through XII intact without acuity tested in the office  Alert and oriented  Grossly non-focal - good strength on testing extremities, sensation intact to LT in distal extremities, Romberg negative, no pronator drift, good finger-to-nose, good balance on 1 foot, good rapid alternating movements, gait normal with good tandem walk  Speech normal    Results for orders placed or performed in visit on 10/25/19  Lipid panel  Result Value Ref Range   Cholesterol 230 (H) <200 mg/dL   HDL 46 > OR = 40 mg/dL   Triglycerides 150 (H) <150 mg/dL   LDL Cholesterol (Calc) 156 (H) mg/dL (calc)   Total CHOL/HDL Ratio 5.0 (H) <5.0 (calc)   Non-HDL Cholesterol (Calc) 184 (H) <130 mg/dL (calc)  Comprehensive Metabolic Panel (CMET)  Result Value Ref Range   Glucose, Bld 104 (H) 65 - 99 mg/dL   BUN 20 7 - 25 mg/dL   Creat 1.03 0.60 - 1.35 mg/dL   BUN/Creatinine Ratio NOT APPLICABLE 6 - 22 (calc)   Sodium 140 135 - 146 mmol/L   Potassium 4.3 3.5 - 5.3 mmol/L   Chloride 103 98 - 110 mmol/L   CO2 31 20 - 32 mmol/L   Calcium 9.3 8.6 - 10.3 mg/dL   Total Protein 6.4 6.1 - 8.1 g/dL   Albumin 4.3 3.6 - 5.1 g/dL   Globulin 2.1 1.9 - 3.7 g/dL (calc)   AG Ratio 2.0 1.0 - 2.5 (calc)   Total Bilirubin 0.7 0.2 - 1.2 mg/dL   Alkaline phosphatase (APISO) 35 (L) 36 - 130 U/L   AST 21 10 - 40 U/L   ALT 22 9 - 46 U/L  PSA  Result Value Ref Range   PSA 1.0 < OR = 4.0 ng/mL  HgB A1c    Result Value Ref Range   Hgb A1c MFr Bld 4.9 <5.7 % of total Hgb   Mean Plasma Glucose 94 (calc)   eAG (mmol/L) 5.2 (calc)       Assessment & Plan:    1. Concussion without loss of consciousness, initial encounter Discussed this with patient, and educated about concussion.  Noting the trauma, do feel he likely had a concussion, and do feel mild. Discussed the pressure feeling he has in his head was present before the head injury, and he noted it really has not changed since.  It seemed to be sequela from his Covid infection.  Do not feel that the concerns are increased now after that head injury.  Do not feel rushing to have a CT scan done presently is needed at this time. Did note the importance of continuing to closely monitor, and if does start to develop any more concerning symptoms as outlined today, such as nausea with vomiting, worsening headache, any vision changes, any one-sided symptoms of concern, any balance difficulties, concern with more cognitive functions, he does need to be seen immediately and evaluated.  He was understanding of that. Can use a Tylenol product as needed for some of the pressure sensation noted. Noted the importance of not having a second head injury here in the near future and being careful with activities over the next few weeks.  He asked  about working at home out in the yard, and noted reasonable with minimizing risk of any potential repeat head injury a priority.  2. COVID-19 virus infection Did have sequela from the Covid infection as described above, and unclear how much longer that may persist. Continue to monitor presently.  3. Nonintractable headache, unspecified chronicity pattern, unspecified headache type As above.   As noted above, continue to closely monitor presently, and follow-up if more problematic over time or if further questions arise.     Towanda Malkin, MD 02/14/20 2:26 PM

## 2020-02-14 NOTE — Telephone Encounter (Signed)
Pt. Called to report a fall off golf cart on Sunday, 9/5, and hit the top, posterior head on pavement.  Stated the fall occurred at approx. 9:30 PM.  Reported no loss of consciousness.  "I was able to get right up." Denied any abrasion, laceration, bleeding.  Stated there was a bump initially, that has resolved.  Reported the area is tender to touch.  Denied headache, blurred vision, nausea, dizziness, or feeling lethargic.  Reported mild light headed feeling if he moves quickly or bends over to tie his shoes.  Stated the mild light headed feeling "is nothing out of the ordinary."  Pt. reported he has "pressure" in bilat. temple region, that has been present since he had COVID 3 weeks ago.  Voiced concern about hitting head so hard, and requested an appt. For evaluation.  Appt. Scheduled with Dr. Roxan Hockey today at 2:00 PM.  Care advice given.  Pt. Encouraged to call back or go to ER if symptoms worsen.  Verb. Understanding. Agreed with plan.      Reason for Disposition . Scalp swelling, bruise or pain  Answer Assessment - Initial Assessment Questions 1. MECHANISM: "How did the injury happen?" For falls, ask: "What height did you fall from?" and "What surface did you fall against?"      Fell off golf cart and hit back - top of head on pavement  2. ONSET: "When did the injury happen?" (Minutes or hours ago)      Sunday, 9/5 3. NEUROLOGIC SYMPTOMS: "Was there any loss of consciousness?" "Are there any other neurological symptoms?"      Denied loss of consciousness 4. MENTAL STATUS: "Does the person know who he is, who you are, and where he is?"      Alert to person, place and time 5. LOCATION: "What part of the head was hit?"      Top, back of head  6. SCALP APPEARANCE: "What does the scalp look like? Is it bleeding now?" If Yes, ask: "Is it difficult to stop?"      Was a bump that has resolved.  Some soreness at site when touches it. 7. SIZE: For cuts, bruises, or swelling, ask: "How large is it?"  (e.g., inches or centimeters)      Denied any break in skin 8. PAIN: "Is there any pain?" If Yes, ask: "How bad is it?"  (e.g., Scale 1-10; or mild, moderate, severe)     Denied pain; tenderness at site 9. TETANUS: For any breaks in the skin, ask: "When was the last tetanus booster?"     n/a 10. OTHER SYMPTOMS: "Do you have any other symptoms?" (e.g., neck pain, vomiting)       Denied headache, blurred vision.  Reported he gets mildly light- headed when moves quickly or bends over to tie show. Denied any confusion, speech problems or unilateral weakness. 11. PREGNANCY: "Is there any chance you are pregnant?" "When was your last menstrual period?"       n/a  Protocols used: HEAD INJURY-A-AH

## 2020-02-28 ENCOUNTER — Telehealth: Payer: Self-pay

## 2020-02-28 NOTE — Telephone Encounter (Signed)
Copied from [place] [id number]. Topic: Appointment Scheduling - Scheduling Inquiry for Clinic >> Feb 28, 2020  2:54 PM Oneta Rack wrote: Patient would like his virtual visit scheduled for 8am with Dr. Roxan Hockey on 02/29/2020 turned into a "in office visit". Patient experiencing sinus pressure. Its been more then 21 days since COVID but has recently tested negative >> Feb 28, 2020  3:02 PM [place], Marland Kitchen wrote: Please advise?

## 2020-02-29 ENCOUNTER — Other Ambulatory Visit: Payer: Self-pay

## 2020-02-29 ENCOUNTER — Ambulatory Visit
Admission: RE | Admit: 2020-02-29 | Discharge: 2020-02-29 | Disposition: A | Discharge status: Home / Self Care | Payer: BC Managed Care – PPO | Source: Ambulatory Visit | Attending: Internal Medicine | Admitting: Internal Medicine

## 2020-02-29 ENCOUNTER — Ambulatory Visit (INDEPENDENT_AMBULATORY_CARE_PROVIDER_SITE_OTHER): Payer: BC Managed Care – PPO | Admitting: Internal Medicine

## 2020-02-29 ENCOUNTER — Encounter: Payer: Self-pay | Admitting: Internal Medicine

## 2020-02-29 VITALS — BP 120/82 | HR 86 | Temp 97.8°F | Resp 16 | Ht 74.0 in | Wt 208.6 lb

## 2020-02-29 DIAGNOSIS — R519 Headache, unspecified: Secondary | ICD-10-CM | POA: Insufficient documentation

## 2020-02-29 DIAGNOSIS — S060X0A Concussion without loss of consciousness, initial encounter: Secondary | ICD-10-CM | POA: Insufficient documentation

## 2020-02-29 DIAGNOSIS — U071 COVID-19: Secondary | ICD-10-CM

## 2020-02-29 NOTE — Addendum Note (Signed)
Addended by: Lennie Muckle on: 02/29/2020 09:16 AM   Modules accepted: Orders

## 2020-02-29 NOTE — Patient Instructions (Signed)
CT scan was ordered to be done today.  Please make a follow-up appointment with your eye doctor to help assess  Can use Tylenol products as needed in the short-term, and if the CT scan is unremarkable, can use ibuprofen or Aleve products as needed, take those with food to help with symptoms as following.

## 2020-02-29 NOTE — Progress Notes (Signed)
Patient ID: Gilbert Buchanan, male    DOB: Feb 22, 1971, 49 y.o.   MRN: [id number]  PCP: Steele Sizer, MD  Chief Complaint  Patient presents with  . Pressure Behind the Eyes    patient says after covid in August he has had a constant pressure behind and over eyes, he is fine for a couple hours in the morning then it gets worse and wrose as the day goes on    Subjective:   Gilbert Buchanan is a 49 y.o. male, presents to clinic with CC of the following:  Chief Complaint  Patient presents with  . Pressure Behind the Eyes    patient says after covid in August he has had a constant pressure behind and over eyes, he is fine for a couple hours in the morning then it gets worse and wrose as the day goes on    HPI:  Patient is a 49 year old male patient of Dr. Ancil Boozer Presents with the above complaint. He was diagnosed with Covid 01/29/2020, with symptoms beginning the day prior, and prescribed an antibiotic shortly thereafter for worsening ear pain He was seen 02/14/2020 after he fell off a golf cart with a mild concussion concern noted on that visit.  He notes today that he has had a constant pressure felt behind his eyes, started after the Covid infection in August, before the concussion incident, and notes when he first gets up in the morning it is not problematic, but starts to become more problematic as the day progresses.  Seems to worsen as the day goes on.  Describes it as a pressure in the frontal sinus region, felt behind the eyes, and more painful as the day progresses.  Not the worst headache of his life. Denies any loss of vision, double vision, or blurred vision.  Although does note his vision is definitely bothered later in the day with the increased headache.  No nausea or vomiting Denies any one-sided symptoms of concern, no facial droop, no balance difficulties. He has been working, and notes he is doing okay with mental function and tasks at work, although is affected some as  the day progresses. He notes that when he gets home at night, he is cranky, and many in the family do not want to talk to him as a result. He states he is just not felt like he has returned back to normal after the Covid infection.  Feels a little more fatigued and decreased energy levels as part of that.  Denies more specific symptoms with no concern of upper respiratory infectious symptoms, fevers, no bleeding per rectum or dark or black stools.  He noted he stopped wearing his contacts a couple years back as he could not see well up close with his contacts, and was doing okay seeing further away.  He noted more recently, he has put his contacts back in at times, and that seems to help some with his symptoms.  He has tried no medicines to help with his symptoms, as he did not want to mask anything.  Patient Active Problem List   Diagnosis Date Noted  . Concussion with no loss of consciousness 02/14/2020  . COVID-19 01/31/2020  . 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 Baker's cyst of knee, right 12/25/2016  . Dyslipidemia 11/11/2015  . Hyperglycemia 11/11/2015  . Chronic constipation 11/11/2015  . GAD (generalized anxiety disorder) 11/11/2015  . Prostatitis 05/05/2015  . BPH with obstruction/lower urinary tract symptoms 05/05/2015  Current Outpatient Medications:  .  fluticasone (FLONASE) 50 MCG/ACT nasal spray, SPRAY 2 SPRAYS INTO EACH NOSTRIL EVERY DAY, Disp: 48 mL, Rfl: 1 .  Multiple Vitamin (MULTIVITAMIN) tablet, Take 1 tablet by mouth daily., Disp: , Rfl:  .  tamsulosin (FLOMAX) 0.4 MG CAPS capsule, Take 1 capsule (0.4 mg total) by mouth daily., Disp: 90 capsule, Rfl: 3 .  tiZANidine (ZANAFLEX) 4 MG tablet, TAKE 1 TABLET(4 MG) BY MOUTH EVERY 6 HOURS AS NEEDED FOR MUSCLE SPASMS, Disp: 90 tablet, Rfl: 0 .  traZODone (DESYREL) 50 MG tablet, Take 0.5-1 tablets (25-50 mg total) by mouth at bedtime., Disp: 90 tablet, Rfl: 1   Allergies  Allergen Reactions  .  Acyclovir And Related      Past Surgical History:  Procedure Laterality Date  . HERNIA REPAIR    . VARICOCELE EXCISION       Family History  Problem Relation Age of Onset  . Kidney disease Mother   . Testicular cancer Father   . Polycystic kidney disease Other   . Heart disease Maternal Grandmother   . Stroke Paternal Grandmother   . Stroke Paternal Grandfather   . Prostate cancer Neg Hx      Social History   Tobacco Use  . Smoking status: Former Smoker    Packs/day: 1.00    Years: 25.00    Pack years: 25.00    Types: Cigarettes, Cigars    Start date: 06/09/1987    Quit date: 06/08/2012    Years since quitting: 7.7  . Smokeless tobacco: Former Systems developer    Types: Snuff    Quit date: 05/07/2017  . Tobacco comment: occasionally cigars  Substance Use Topics  . Alcohol use: Yes    Alcohol/week: 0.0 standard drinks    With staff assistance, above reviewed with the patient today.  ROS: As per HPI, otherwise no specific complaints on a limited and focused system review   No results found for this or any previous visit (from the past 72 hour(s)).   PHQ2/9: Depression screen [facility] 2/9 02/29/2020 02/14/2020 01/31/2020 10/25/2019 10/25/2019  Decreased Interest 0 0 0 0 0  Down, Depressed, Hopeless 0 0 0 0 0  PHQ - 2 Score 0 0 0 0 0  Altered sleeping - - - 2 0  Tired, decreased energy - - - 0 0  Change in appetite - - - 0 0  Feeling bad or failure about yourself  - - - 0 0  Trouble concentrating - - - 0 0  Moving slowly or fidgety/restless - - - 0 0  Suicidal thoughts - - - 0 0  PHQ-9 Score - - - 2 0  Difficult doing work/chores - - - - -  Some recent data might be hidden   PHQ-2/9 Result is neg  Fall Risk: Fall Risk  02/29/2020 02/14/2020 01/31/2020 10/25/2019 05/08/2019  Falls in the past year? 0 0 0 0 0  Number falls in past yr: 0 0 0 0 0  Injury with Fall? 0 0 0 0 0  Follow up - Falls evaluation completed - - -      Objective:   Vitals:   02/29/20 0811  BP: 120/82    Pulse: 86  Resp: 16  Temp: 97.8 F (36.6 C)  TempSrc: Oral  SpO2: 99%  Weight: 208 lb 9.6 oz (94.6 kg)  Height: 6\' 2"  (1.88 m)    Body mass index is 26.78 kg/m.  Physical Exam   NAD, masked, pleasant, not ill-appearing HEENT -  [place]/AT, no discomfort in the posterior occipital region where he had the prior head trauma with no hematoma noted, sclera anicteric, PERRL, EOMI without nystagmus, conj - non-inj'ed, TM's and canals clear, pharynx clear Neck - supple, no adenopathy, nontender with palpation over the cervical spine, good neck range of motion  Car - RRR without m/g/r Pulm- RR and effort normal at rest, CTA without wheeze or rales Abd - soft, NT diffusely Back - no CVA tenderness Ext - no LE edema, no active joints Neuro/psychiatric - affect was not flat, appropriate with conversation  Alert and oriented, cranial nerves II through XII intact with acuity not tested in the office.  No facial droop, no tongue deviation, good facial muscle strength, sensation intact in the face to light touch  Grossly non-focal - good strength on testing extremities, sensation intact to LT in distal extremities , Romberg was negative, no pronator drift, good finger-to-nose, good balance on 1 foot, gait was normal in the office with good tandem walk  Speech normal   Results for orders placed or performed in visit on 10/25/19  Lipid panel  Result Value Ref Range   Cholesterol 230 (H) <200 mg/dL   HDL 46 > OR = 40 mg/dL   Triglycerides 150 (H) <150 mg/dL   LDL Cholesterol (Calc) 156 (H) mg/dL (calc)   Total CHOL/HDL Ratio 5.0 (H) <5.0 (calc)   Non-HDL Cholesterol (Calc) 184 (H) <130 mg/dL (calc)  Comprehensive Metabolic Panel (CMET)  Result Value Ref Range   Glucose, Bld 104 (H) 65 - 99 mg/dL   BUN 20 7 - 25 mg/dL   Creat 1.03 0.60 - 1.35 mg/dL   BUN/Creatinine Ratio NOT APPLICABLE 6 - 22 (calc)   Sodium 140 135 - 146 mmol/L   Potassium 4.3 3.5 - 5.3 mmol/L   Chloride 103 98 - 110 mmol/L    CO2 31 20 - 32 mmol/L   Calcium 9.3 8.6 - 10.3 mg/dL   Total Protein 6.4 6.1 - 8.1 g/dL   Albumin 4.3 3.6 - 5.1 g/dL   Globulin 2.1 1.9 - 3.7 g/dL (calc)   AG Ratio 2.0 1.0 - 2.5 (calc)   Total Bilirubin 0.7 0.2 - 1.2 mg/dL   Alkaline phosphatase (APISO) 35 (L) 36 - 130 U/L   AST 21 10 - 40 U/L   ALT 22 9 - 46 U/L  PSA  Result Value Ref Range   PSA 1.0 < OR = 4.0 ng/mL  HgB A1c  Result Value Ref Range   Hgb A1c MFr Bld 4.9 <5.7 % of total Hgb   Mean Plasma Glucose 94 (calc)   eAG (mmol/L) 5.2 (calc)       Assessment & Plan:   1. Nonintractable headache, unspecified chronicity pattern, unspecified headache type 2. Concussion without loss of consciousness, initial encounter 3. Status post recent Covid infection Discussed at length best next steps, and do really feel clinically it is unlikely he has a bleed from his prior head trauma, which would possibly be a subdural hematoma concern.  No focal deficits on exam.  Cannot exclude without a CT scan noted, and he would feel better pursuing that after our discussion, I do feel it is reasonable. Noted his vision may be a contributor to the headaches, as when he puts his contacts back on, it seems to help.  Also, it worsens as the day progresses with vision potentially contributing with his activities throughout the day. Also discussed after Covid, symptoms can persist for longer periods of time,  with that a possibility as well. Also, postconcussive headaches are not uncommon, and can be the source as well.  Sometimes other medicines are added to help with postconcussive headaches if they persist. We will proceed as follows: We will get a CT scan today to ensure there is no bleeding concerns. Use Tylenol type products as needed in the short-term until the CT scan is confirmed negative.  If negative, can use an ibuprofen or Aleve type product as needed to help, taking them with food.  Noted can use 2 Aleve up to twice daily for 2 to 3 days to  help break the cycle, then if improving, return tomorrow as needed. Also recommended he follow-up with his eye physician to get assessed He does have a history of some anxiety issues, and that may be contributing as well.   - CT Head Wo Contrast; Future  Await CT result, and if symptoms are not improving or more problematic will follow up.  If more severe symptoms arise in association, may need to be seen more emergently in an ER setting as well.     Towanda Malkin, MD 02/29/20 8:14 AM

## 2020-04-17 ENCOUNTER — Other Ambulatory Visit: Payer: BC Managed Care – PPO

## 2020-04-17 ENCOUNTER — Other Ambulatory Visit: Payer: Self-pay

## 2020-04-17 ENCOUNTER — Other Ambulatory Visit: Payer: Self-pay | Admitting: *Deleted

## 2020-04-17 DIAGNOSIS — N138 Other obstructive and reflux uropathy: Secondary | ICD-10-CM | POA: Diagnosis not present

## 2020-04-17 DIAGNOSIS — N401 Enlarged prostate with lower urinary tract symptoms: Secondary | ICD-10-CM | POA: Diagnosis not present

## 2020-04-18 LAB — PSA: Prostate Specific Ag, Serum: 1.3 ng/mL (ref 0.0–4.0)

## 2020-04-24 ENCOUNTER — Other Ambulatory Visit: Payer: Self-pay

## 2020-04-24 ENCOUNTER — Ambulatory Visit (INDEPENDENT_AMBULATORY_CARE_PROVIDER_SITE_OTHER): Payer: BC Managed Care – PPO | Admitting: Urology

## 2020-04-24 ENCOUNTER — Encounter: Payer: Self-pay | Admitting: Urology

## 2020-04-24 VITALS — BP 125/78 | HR 88 | Ht 74.0 in | Wt 205.5 lb

## 2020-04-24 DIAGNOSIS — Z125 Encounter for screening for malignant neoplasm of prostate: Secondary | ICD-10-CM

## 2020-04-24 DIAGNOSIS — N401 Enlarged prostate with lower urinary tract symptoms: Secondary | ICD-10-CM

## 2020-04-24 DIAGNOSIS — N138 Other obstructive and reflux uropathy: Secondary | ICD-10-CM

## 2020-04-24 MED ORDER — TAMSULOSIN HCL 0.4 MG PO CAPS: 0.4000 mg | ORAL_CAPSULE | Freq: Every day | ORAL | 3 refills | Status: DC

## 2020-04-24 NOTE — Patient Instructions (Signed)
Prostate Cancer Screening  Prostate cancer screening is a test that is done to check for the presence of prostate cancer in men. The prostate gland is a walnut-sized gland that is located below the bladder and in front of the rectum in males. The function of the prostate is to add fluid to semen during ejaculation. Prostate cancer is the second most common type of cancer in men. Who should have prostate cancer screening?  Screening recommendations vary based on age and other risk factors. Screening is recommended if:  You are older than age 55. If you are age 55-69, talk with your health care provider about your need for screening and how often screening should be done. Because most prostate cancers are slow growing and will not cause death, screening is generally reserved in this age group for men who have a 10-15-year life expectancy.  You are younger than age 55, and you have these risk factors: ? Being a black male or a male of African descent. ? Having a father, brother, or uncle who has been diagnosed with prostate cancer. The risk is higher if your family member's cancer occurred at an early age. Screening is not recommended if:  You are younger than age 40.  You are between the ages of 40 and 54 and you have no risk factors.  You are 70 years of age or older. At this age, the risks that screening can cause are greater than the benefits that it may provide. If you are at high risk for prostate cancer, your health care provider may recommend that you have screenings more often or that you start screening at a younger age. How is screening for prostate cancer done? The recommended prostate cancer screening test is a blood test called the prostate-specific antigen (PSA) test. PSA is a protein that is made in the prostate. As you age, your prostate naturally produces more PSA. Abnormally high PSA levels may be caused by:  Prostate cancer.  An enlarged prostate that is not caused by cancer  (benign prostatic hyperplasia, BPH). This condition is very common in older men.  A prostate gland infection (prostatitis). Depending on the PSA results, you may need more tests, such as:  A physical exam to check the size of your prostate gland.  Blood and imaging tests.  A procedure to remove tissue samples from your prostate gland for testing (biopsy). What are the benefits of prostate cancer screening?  Screening can help to identify cancer at an early stage, before symptoms start and when the cancer can be treated more easily.  There is a small chance that screening may lower your risk of dying from prostate cancer. The chance is small because prostate cancer is a slow-growing cancer, and most men with prostate cancer die from a different cause. What are the risks of prostate cancer screening? The main risk of prostate cancer screening is diagnosing and treating prostate cancer that would never have caused any symptoms or problems. This is called overdiagnosisand overtreatment. PSA screening cannot tell you if your PSA is high due to cancer or a different cause. A prostate biopsy is the only procedure to diagnose prostate cancer. Even the results of a biopsy may not tell you if your cancer needs to be treated. Slow-growing prostate cancer may not need any treatment other than monitoring, so diagnosing and treating it may cause unnecessary stress or other side effects. A prostate biopsy may also cause:  Infection or fever.  A false negative. This is   a result that shows that you do not have prostate cancer when you actually do have prostate cancer. Questions to ask your health care provider  When should I start prostate cancer screening?  What is my risk for prostate cancer?  How often do I need screening?  What type of screening tests do I need?  How do I get my test results?  What do my results mean?  Do I need treatment? Where to find more information  The American Cancer  Society: www.cancer.org  American Urological Association: www.auanet.org Contact a health care provider if:  You have difficulty urinating.  You have pain when you urinate or ejaculate.  You have blood in your urine or semen.  You have pain in your back or in the area of your prostate. Summary  Prostate cancer is a common type of cancer in men. The prostate gland is located below the bladder and in front of the rectum. This gland adds fluid to semen during ejaculation.  Prostate cancer screening may identify cancer at an early stage, when the cancer can be treated more easily.  The prostate-specific antigen (PSA) test is the recommended screening test for prostate cancer.  Discuss the risks and benefits of prostate cancer screening with your health care provider. If you are age 70 or older, the risks that screening can cause are greater than the benefits that it may provide. This information is not intended to replace advice given to you by your health care provider. Make sure you discuss any questions you have with your health care provider. Document Revised: 01/05/2019 Document Reviewed: 01/05/2019 Elsevier Patient Education  2020 [employer] Inc[employer]  

## 2020-04-24 NOTE — Progress Notes (Signed)
° °  04/24/2020 2:19 PM   Gilbert Buchanan 06/15/1970 [id number]  Reason for visit: Follow up PSA screening, BPH/prostatitis  HPI: I saw Gilbert Buchanan back in clinic for the above issues.  He is a 50 year old male who has a long history of BPH/prostatitis with primary complaint of urgency to urinate and pelvic discomfort.  The symptoms are improved moderately on Flomax.  He denies any urinary problems overnight and has no nocturia.  He previously was treated long-term by different urologist by antibiotics for a few years which he Buchanan improved his symptoms.  He is not interested in discussing outlet procedures, and his primary complaint is more pelvic discomfort as opposed to true weak stream or urinary complaints.  He denies any change in urination over the last year or gross hematuria.  PSA screening has always been within the normal range, and is stable at 1.3 this year from 1.2 in 2019.  We reviewed the AUA guidelines that do not recommend routine PSA screening in men without a family history of prostate cancer until age 30.  He would like to continue yearly screening.  We discussed the risks and benefits at length.  We reviewed that things have been stable over the last few years regarding his chronic pelvic discomfort and urinary symptoms on Flomax, as well as his PSA values.  I would be comfortable with his PCP checking his PSA every other year, and continuing to refill his Flomax.  We are of course happy to see him if anything were to change in the future or he had an exacerbation of his urinary symptoms.  Flomax refilled Consider PSA screening and having Flomax refilled by PCP   Billey Co, MD  [facility] [address], [place], [place] [address] [phone]

## 2020-04-26 DIAGNOSIS — D2262 Melanocytic nevi of left upper limb, including shoulder: Secondary | ICD-10-CM | POA: Diagnosis not present

## 2020-04-26 DIAGNOSIS — D225 Melanocytic nevi of trunk: Secondary | ICD-10-CM | POA: Diagnosis not present

## 2020-04-26 DIAGNOSIS — D2272 Melanocytic nevi of left lower limb, including hip: Secondary | ICD-10-CM | POA: Diagnosis not present

## 2020-04-26 DIAGNOSIS — D2261 Melanocytic nevi of right upper limb, including shoulder: Secondary | ICD-10-CM | POA: Diagnosis not present

## 2020-05-08 ENCOUNTER — Other Ambulatory Visit: Payer: Self-pay

## 2020-05-08 ENCOUNTER — Ambulatory Visit (INDEPENDENT_AMBULATORY_CARE_PROVIDER_SITE_OTHER): Payer: BC Managed Care – PPO | Admitting: Family Medicine

## 2020-05-08 ENCOUNTER — Encounter: Payer: Self-pay | Admitting: Family Medicine

## 2020-05-08 VITALS — BP 122/68 | HR 79 | Temp 98.0°F | Resp 16 | Ht 74.0 in | Wt 203.1 lb

## 2020-05-08 DIAGNOSIS — E785 Hyperlipidemia, unspecified: Secondary | ICD-10-CM

## 2020-05-08 DIAGNOSIS — N401 Enlarged prostate with lower urinary tract symptoms: Secondary | ICD-10-CM

## 2020-05-08 DIAGNOSIS — G4700 Insomnia, unspecified: Secondary | ICD-10-CM

## 2020-05-08 DIAGNOSIS — M545 Low back pain, unspecified: Secondary | ICD-10-CM

## 2020-05-08 DIAGNOSIS — N138 Other obstructive and reflux uropathy: Secondary | ICD-10-CM

## 2020-05-08 DIAGNOSIS — Z0000 Encounter for general adult medical examination without abnormal findings: Secondary | ICD-10-CM | POA: Diagnosis not present

## 2020-05-08 DIAGNOSIS — Z23 Encounter for immunization: Secondary | ICD-10-CM | POA: Diagnosis not present

## 2020-05-08 DIAGNOSIS — F411 Generalized anxiety disorder: Secondary | ICD-10-CM | POA: Diagnosis not present

## 2020-05-08 DIAGNOSIS — Z1211 Encounter for screening for malignant neoplasm of colon: Secondary | ICD-10-CM

## 2020-05-08 DIAGNOSIS — R739 Hyperglycemia, unspecified: Secondary | ICD-10-CM | POA: Diagnosis not present

## 2020-05-08 MED ORDER — TRAZODONE HCL 50 MG PO TABS: 25.0000 mg | ORAL_TABLET | Freq: Every day | ORAL | 1 refills | Status: DC

## 2020-05-08 NOTE — Progress Notes (Signed)
Name: Gilbert Buchanan   MRN: [id number]    DOB: 22-Jan-1971   Date:05/08/2020       Progress Note  Subjective  Chief Complaint  CPE  HPI  Patient presents for annual CPE and follow up  GAD: he also has OCD,and history of ADD as a child ( but could not tolerate medication)he has tried multiple medications in the past - but states he could not tolerate the side effects of medications, he is off alprazolam, he also stopped Buspar  Denies any panic attacks, states has difficultyfalling asleep at nightbutTrazodone half pill prnhelps him fall asleep.Denies depression or suicidal thoughts. He said he was tested for ADD in HS and Ritalin made him feel worse.He stopped Wellbutrin , he states he did not noticed a change in his mood or OCD and prefers not taking medication He states he does not want any other medications, doing well sleeping and managing his symptoms on his own    Hyperlipidemia: hetookVascepa for over 3 years but he had  to switch to Lovaza because of insurance, he  changed his diet since Fall of 2016. He stopped drinking beer ( Used to drink 20-30 beers on weekends),started to eat a high protein, high fiber, low carb and low sugar, except that not as consistent with diet lately.We had to change to fenofibrate but afraid to side effects, we will recheck levels, currently not on medications. LDL was above 150 but low ASCVD we will monitor   The 10-year ASCVD risk score Mikey Bussing DC Brooke Bonito., et al., 2013) is: 3.2%   Values used to calculate the score:     Age: 49 years     Sex: Male     Is Non-Hispanic African American: No     Diabetic: No     Tobacco smoker: No     Systolic Blood Pressure: 269 mmHg     Is BP treated: No     HDL Cholesterol: 46 mg/dL     Total Cholesterol: 230 mg/dL  Obesity: he was obese since childhood. He lost a lot of weight since fall 2016, doing well, his weight has been stable now, he has been stable between 197 lbs and 203 lbs   BPH:he has been  released by Urologist, PSA has been normal and doing well on flomax   Intermittent low back pain: he had severe symptoms of back pain with right lower leg radiculitis and right foot drop in 2017, he did not have MRI because of cost and went to chiropractor, he is doing well now, occasionally has back pain and only very seldom takes Zanaflex   History of concussion: feel from the back of a golf cart and hit his head, seen by Dr. Roxan Hockey, CT negative, he is feeling well now, last headache was two weeks ago , doing well now   IPSS Questionnaire (AUA-7): Over the past month   1)  How often have you had a sensation of not emptying your bladder completely after you finish urinating?  1- Less than one  2)  How often have you had to urinate again less than two hours after you finished urinating? 3- Half the Time  3)  How often have you found you stopped and started again several times when you urinated?  0- Not at all  4) How difficult have you found it to postpone urination?  0- Not at all  5) How often have you had a weak urinary stream?  1- Less than one  6) How  often have you had to push or strain to begin urination?  0- Not at all  7) How many times did you most typically get up to urinate from the time you went to bed until the time you got up in the morning?  0- None  Total score:  0-7 mildly symptomatic   8-19 moderately symptomatic   20-35 severely symptomatic     Diet: balanced Exercise: he moves all the time  Depression: phq 9 is negative Depression screen [facility] 2/9 05/08/2020 02/29/2020 02/14/2020 01/31/2020 10/25/2019  Decreased Interest 0 0 0 0 0  Down, Depressed, Hopeless 0 0 0 0 0  PHQ - 2 Score 0 0 0 0 0  Altered sleeping 1 - - - 2  Tired, decreased energy 0 - - - 0  Change in appetite 0 - - - 0  Feeling bad or failure about yourself  0 - - - 0  Trouble concentrating 0 - - - 0  Moving slowly or fidgety/restless 0 - - - 0  Suicidal thoughts 0 - - - 0  PHQ-9 Score 1 - - - 2   Difficult doing work/chores - - - - -  Some recent data might be hidden    Hypertension:  BP Readings from Last 3 Encounters:  05/08/20 122/68  04/24/20 125/78  02/29/20 120/82    Obesity: Wt Readings from Last 3 Encounters:  05/08/20 203 lb 1.6 oz (92.1 kg)  04/24/20 205 lb 8 oz (93.2 kg)  02/29/20 208 lb 9.6 oz (94.6 kg)   BMI Readings from Last 3 Encounters:  05/08/20 26.08 kg/m  04/24/20 26.38 kg/m  02/29/20 26.78 kg/m     Lipids:  Lab Results  Component Value Date   CHOL 230 (H) 11/10/2019   CHOL 222 (H) 11/04/2018   CHOL 188 04/09/2017   Lab Results  Component Value Date   HDL 46 11/10/2019   HDL 47 11/04/2018   HDL 48 04/09/2017   Lab Results  Component Value Date   LDLCALC 156 (H) 11/10/2019   LDLCALC 150 (H) 11/04/2018   LDLCALC 119 (H) 04/09/2017   Lab Results  Component Value Date   TRIG 150 (H) 11/10/2019   TRIG 128 11/04/2018   TRIG 103 04/09/2017   Lab Results  Component Value Date   CHOLHDL 5.0 (H) 11/10/2019   CHOLHDL 4.7 11/04/2018   CHOLHDL 3.9 04/09/2017   No results found for: LDLDIRECT Glucose:  Glucose  Date Value Ref Range Status  01/24/2013 137 (H) 65 - 99 mg/dL Final   Glucose, Bld  Date Value Ref Range Status  11/10/2019 104 (H) 65 - 99 mg/dL Final    Comment:    .            Fasting reference interval . For someone without known diabetes, a glucose value between 100 and 125 mg/dL is consistent with prediabetes and should be confirmed with a follow-up test. .   11/04/2018 102 (H) 65 - 99 mg/dL Final    Comment:    .            Fasting reference interval . For someone without known diabetes, a glucose value between 100 and 125 mg/dL is consistent with prediabetes and should be confirmed with a follow-up test. .   04/09/2017 108 (H) 65 - 99 mg/dL Final    Comment:    .            Fasting reference interval . For someone without known diabetes, a  glucose value between 100 and 125 mg/dL is consistent  with prediabetes and should be confirmed with a follow-up test. .       Office Visit from 02/29/2020 in [facility]  AUDIT-C Score 1      Married STD testing and prevention (HIV/chl/gon/syphilis): not interested  Hep C: 11/04/2018  Skin cancer: Discussed monitoring for atypical lesions Colorectal cancer: N/A Prostate cancer:  Lab Results  Component Value Date   PSA 1.0 11/10/2019   PSA 0.8 04/09/2017     Lung cancer:  Low Dose CT Chest recommended if Age 12-80 years, 30 pack-year currently smoking OR have quit w/in 15years. Patient does not qualify.   AAA:The USPSTF recommends one-time screening with ultrasonography in men ages 72 to 47 years who have ever smoked ECG:  01/24/2013  Vaccines:   Pneumonia:  educated and discussed with patient. Flu:  educated and discussed with patient.  Advanced Care Planning: A voluntary discussion about advance care planning including the explanation and discussion of advance directives.  Discussed health care proxy and Living will, and the patient was able to identify a health care proxy as wife.  Patient does not have a living will at present time. If patient does have living will, I have requested they bring this to the clinic to be scanned in to their chart.  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   Baker's cyst of knee, right 12/25/2016   Dyslipidemia 11/11/2015   Hyperglycemia 11/11/2015   Chronic constipation 11/11/2015   GAD (generalized anxiety disorder) 11/11/2015   Prostatitis 05/05/2015   BPH with obstruction/lower urinary tract symptoms 05/05/2015    Past Surgical History:  Procedure Laterality Date   HERNIA REPAIR     VARICOCELE EXCISION      Family History  Problem Relation Age of Onset   Kidney disease  Mother    Testicular cancer Father    Polycystic kidney disease Other    Heart disease Maternal Grandmother    Stroke Paternal Grandmother    Stroke Paternal Grandfather    Prostate cancer Neg Hx     Social History   Socioeconomic History   Marital status: Married    Spouse name: Angelique   Number of children: 2   Years of education: Not on file   Highest education level: Bachelor's degree (e.g., BA, AB, BS)  Occupational History   Occupation: Engineer, building services   Tobacco Use   Smoking status: Former Smoker    Packs/day: 1.00    Years: 25.00    Pack years: 25.00    Types: Cigarettes, Cigars    Start date: 06/09/1987    Quit date: 06/08/2012    Years since quitting: 7.9   Smokeless tobacco: Former Systems developer    Types: Snuff    Quit date: 05/07/2017   Tobacco comment: occasionally cigars  Vaping Use   Vaping Use: Former   Start date: 06/28/2014   Quit date: 06/08/2016  Substance and Sexual Activity   Alcohol use: Yes    Alcohol/week: 0.0 standard drinks   Drug use: No   Sexual activity: Yes    Partners: Female  Other Topics Concern   Not on file  Social History Narrative   Married, two children at home    He work in [place] now , Engineer, building services    Social Determinants  of Health   Financial Resource Strain: Low Risk    Difficulty of Paying Living Expenses: Not hard at all  Food Insecurity: No Food Insecurity   Worried About [place] in the Last Year: Never true   [place] in the Last Year: Never true  Transportation Needs: No Transportation Needs   Lack of Transportation (Medical): No   Lack of Transportation (Non-Medical): No  Physical Activity: Sufficiently Active   Days of Exercise per Week: 3 days   Minutes of Exercise per Session: 60 min  Stress: Stress Concern Present   Feeling of Stress : To some extent  Social Connections: Engineer, building services of Communication with Friends and Family: More than three times  a week   Frequency of Social Gatherings with Friends and Family: More than three times a week   Attends Religious Services: More than 4 times per year   Active Member of [employer] or Organizations: Yes   Attends Music therapist: More than 4 times per year   Marital Status: Married  Human resources officer Violence: Not At Risk   Fear of Current or Ex-Partner: No   Emotionally Abused: No   Physically Abused: No   Sexually Abused: No     Current Outpatient Medications:    fluticasone (FLONASE) 50 MCG/ACT nasal spray, SPRAY 2 SPRAYS INTO EACH NOSTRIL EVERY DAY, Disp: 48 mL, Rfl: 1   Multiple Vitamin (MULTIVITAMIN) tablet, Take 1 tablet by mouth daily., Disp: , Rfl:    tamsulosin (FLOMAX) 0.4 MG CAPS capsule, Take 1 capsule (0.4 mg total) by mouth daily., Disp: 90 capsule, Rfl: 3   tiZANidine (ZANAFLEX) 4 MG tablet, TAKE 1 TABLET(4 MG) BY MOUTH EVERY 6 HOURS AS NEEDED FOR MUSCLE SPASMS, Disp: 90 tablet, Rfl: 0   traZODone (DESYREL) 50 MG tablet, Take 0.5-1 tablets (25-50 mg total) by mouth at bedtime., Disp: 90 tablet, Rfl: 1  Allergies  Allergen Reactions   Acyclovir And Related      ROS  Constitutional: Negative for fever or weight change.  Respiratory: Negative for cough and shortness of breath.   Cardiovascular: Negative for chest pain or palpitations.  Gastrointestinal: Negative for abdominal pain, no bowel changes.  Musculoskeletal: Negative for gait problem or joint swelling.  Skin: Negative for rash.  Neurological: Negative for dizziness or headache.  No other specific complaints in a complete review of systems (except as listed in HPI above).  Objective  Vitals:   05/08/20 1115  BP: 122/68  Pulse: 79  Resp: 16  Temp: 98 F (36.7 C)  TempSrc: Oral  SpO2: 100%  Weight: 203 lb 1.6 oz (92.1 kg)  Height: 6\' 2"  (1.88 m)    Body mass index is 26.08 kg/m.  Physical Exam  Constitutional: Patient appears well-developed and well-nourished. No  distress.  HENT: Head: Normocephalic and atraumatic. Ears: B TMs ok, no erythema or effusion; Nose: Not done  Mouth/Throat: not done Eyes: Conjunctivae and EOM are normal. Pupils are equal, round, and reactive to light. No scleral icterus.  Neck: Normal range of motion. Neck supple. No JVD present. No thyromegaly present.  Cardiovascular: Normal rate, regular rhythm and normal heart sounds.  No murmur heard. No BLE edema. Pulmonary/Chest: Effort normal and breath sounds normal. No respiratory distress. Abdominal: Soft. Bowel sounds are normal, no distension. There is no tenderness. no masses MALE GENITALIA: Normal descended testes bilaterally, no masses palpated, no hernias, no lesions, no discharge RECTAL: not done  Musculoskeletal: Normal range of  motion, no joint effusions. No gross deformities Neurological: he is alert and oriented to person, place, and time. No cranial nerve deficit. Coordination, balance, strength, speech and gait are normal.  Skin: Skin is warm and dry. No rash noted. No erythema.  Psychiatric: Patient has a normal mood and affect. behavior is normal. Judgment and thought content normal.  Recent Results (from the past 2160 hour(s))  PSA     Status: None   Collection Time: 04/17/20  1:13 PM  Result Value Ref Range   Prostate Specific Ag, Serum 1.3 0.0 - 4.0 ng/mL    Comment: Roche ECLIA methodology. According to the American Urological Association, Serum PSA should decrease and remain at undetectable levels after radical prostatectomy. The AUA defines biochemical recurrence as an initial PSA value 0.2 ng/mL or greater followed by a subsequent confirmatory PSA value 0.2 ng/mL or greater. Values obtained with different assay methods or kits cannot be used interchangeably. Results cannot be interpreted as absolute evidence of the presence or absence of malignant disease.      Fall Risk: Fall Risk  05/08/2020 02/29/2020 02/14/2020 01/31/2020 10/25/2019  Falls in the  past year? 0 1 0 0 0  Number falls in past yr: 0 0 0 0 0  Injury with Fall? 0 1 0 0 0  Follow up - - Falls evaluation completed - -     Functional Status Survey: Is the patient deaf or have difficulty hearing?: No Does the patient have difficulty seeing, even when wearing glasses/contacts?: No Does the patient have difficulty concentrating, remembering, or making decisions?: No Does the patient have difficulty walking or climbing stairs?: No Does the patient have difficulty dressing or bathing?: No Does the patient have difficulty doing errands alone such as visiting a doctor's office or shopping?: No    Assessment & Plan  1. GAD (generalized anxiety disorder)  Doing well   2. Hyperglycemia  Recheck A1C yearly   3. Dyslipidemia  On life style modification only  4. Well adult exam   5. Intermittent low back pain   6. BPH with obstruction/lower urinary tract symptoms   7. Colon cancer screening  - Ambulatory referral to Gastroenterology  8. Insomnia, unspecified type  - traZODone (DESYREL) 50 MG tablet; Take 0.5-1 tablets (25-50 mg total) by mouth at bedtime.  Dispense: 90 tablet; Refill: 1   -Prostate cancer screening and PSA options (with potential risks and benefits of testing vs not testing) were discussed along with recent recs/guidelines. -USPSTF grade A and B recommendations reviewed with patient; age-appropriate recommendations, preventive care, screening tests, etc discussed and encouraged; healthy living encouraged; see AVS for patient education given to patient -Discussed importance of 150 minutes of physical activity weekly, eat two servings of fish weekly, eat one serving of tree nuts ( cashews, pistachios, pecans, almonds.Marland Kitchen) every other day, eat 6 servings of fruit/vegetables daily and drink plenty of water and avoid sweet beverages.

## 2020-05-08 NOTE — Patient Instructions (Signed)
Preventive Care 48-48 Years Old, Male Preventive care refers to lifestyle choices and visits with your health care provider that can promote health and wellness. This includes:  A yearly physical exam. This is also called an annual well check.  Regular dental and eye exams.  Immunizations.  Screening for certain conditions.  Healthy lifestyle choices, such as eating a healthy diet, getting regular exercise, not using drugs or products that contain nicotine and tobacco, and limiting alcohol use. What can I expect for my preventive care visit? Physical exam Your health care provider will check:  Height and weight. These may be used to calculate body mass index (BMI), which is a measurement that tells if you are at a healthy weight.  Heart rate and blood pressure.  Your skin for abnormal spots. Counseling Your health care provider may ask you questions about:  Alcohol, tobacco, and drug use.  Emotional well-being.  Home and relationship well-being.  Sexual activity.  Eating habits.  Work and work Statistician. What immunizations do I need?  Influenza (flu) vaccine  This is recommended every year. Tetanus, diphtheria, and pertussis (Tdap) vaccine  You may need a Td booster every 10 years. Varicella (chickenpox) vaccine  You may need this vaccine if you have not already been vaccinated. Zoster (shingles) vaccine  You may need this after age 64. Measles, mumps, and rubella (MMR) vaccine  You may need at least one dose of MMR if you were born in 1957 or later. You may also need a second dose. Pneumococcal conjugate (PCV13) vaccine  You may need this if you have certain conditions and were not previously vaccinated. Pneumococcal polysaccharide (PPSV23) vaccine  You may need one or two doses if you smoke cigarettes or if you have certain conditions. Meningococcal conjugate (MenACWY) vaccine  You may need this if you have certain conditions. Hepatitis A  vaccine  You may need this if you have certain conditions or if you travel or work in places where you may be exposed to hepatitis A. Hepatitis B vaccine  You may need this if you have certain conditions or if you travel or work in places where you may be exposed to hepatitis B. Haemophilus influenzae type b (Hib) vaccine  You may need this if you have certain risk factors. Human papillomavirus (HPV) vaccine  If recommended by your health care provider, you may need three doses over 6 months. You may receive vaccines as individual doses or as more than one vaccine together in one shot (combination vaccines). Talk with your health care provider about the risks and benefits of combination vaccines. What tests do I need? Blood tests  Lipid and cholesterol levels. These may be checked every 5 years, or more frequently if you are over 48 years old.  Hepatitis C test.  Hepatitis B test. Screening  Lung cancer screening. You may have this screening every year starting at age 48 if you have a 30-pack-year history of smoking and currently smoke or have quit within the past 15 years.  Prostate cancer screening. Recommendations will vary depending on your family history and other risks.  Colorectal cancer screening. All adults should have this screening starting at age 48 and continuing until age 2. Your health care provider may recommend screening at age 14 if you are at increased risk. You will have tests every 1-10 years, depending on your results and the type of screening test.  Diabetes screening. This is done by checking your blood sugar (glucose) after you have not eaten  for a while (fasting). You may have this done every 1-3 years.  Sexually transmitted disease (STD) testing. Follow these instructions at home: Eating and drinking  Eat a diet that includes fresh fruits and vegetables, whole grains, lean protein, and low-fat dairy products.  Take vitamin and mineral supplements as  recommended by your health care provider.  Do not drink alcohol if your health care provider tells you not to drink.  If you drink alcohol: ? Limit how much you have to 0-2 drinks a day. ? Be aware of how much alcohol is in your drink. In the [place] one drink equals one 12 oz bottle of beer (355 mL), one 5 oz glass of wine (148 mL), or one 1 oz glass of hard liquor (44 mL). Lifestyle  Take daily care of your teeth and gums.  Stay active. Exercise for at least 30 minutes on 5 or more days each week.  Do not use any products that contain nicotine or tobacco, such as cigarettes, e-cigarettes, and chewing tobacco. If you need help quitting, ask your health care provider.  If you are sexually active, practice safe sex. Use a condom or other form of protection to prevent STIs (sexually transmitted infections).  Talk with your health care provider about taking a low-dose aspirin every day starting at age 48. What's next?  Go to your health care provider once a year for a well check visit at 48  Ask your health care provider how often you should have your eyes and teeth checked.  Stay up to date on all vaccines. This information is not intended to replace advice given to you by your health care provider. Make sure you discuss any questions you have with your health care provider. Document Revised: 05/19/2018 Document Reviewed: 05/19/2018 Elsevier Patient Education  2020 [employer].

## 2020-05-10 ENCOUNTER — Encounter: Payer: BC Managed Care – PPO | Admitting: Internal Medicine

## 2020-05-10 ENCOUNTER — Encounter: Payer: BC Managed Care – PPO | Admitting: Family Medicine

## 2020-05-16 ENCOUNTER — Encounter: Payer: Self-pay | Admitting: *Deleted

## 2020-05-18 ENCOUNTER — Other Ambulatory Visit: Payer: Self-pay | Admitting: Urology

## 2020-05-18 DIAGNOSIS — N138 Other obstructive and reflux uropathy: Secondary | ICD-10-CM

## 2020-05-22 ENCOUNTER — Telehealth (INDEPENDENT_AMBULATORY_CARE_PROVIDER_SITE_OTHER): Payer: Self-pay | Admitting: Gastroenterology

## 2020-05-22 ENCOUNTER — Other Ambulatory Visit: Payer: Self-pay

## 2020-05-22 DIAGNOSIS — Z1211 Encounter for screening for malignant neoplasm of colon: Secondary | ICD-10-CM

## 2020-05-22 MED ORDER — PEG 3350-KCL-NA BICARB-NACL 420 G PO SOLR: 4000.0000 mL | Freq: Once | ORAL | 0 refills | Status: AC

## 2020-05-22 NOTE — Progress Notes (Signed)
Gastroenterology Pre-Procedure Review  Request Date: Friday 07/12/20 Requesting Physician: Dr. Vicente Males  PATIENT REVIEW QUESTIONS: The patient responded to the following health history questions as indicated:    1. Are you having any GI issues? no 2. Do you have a personal history of Polyps? no 3. Do you have a family history of Colon Cancer or Polyps? no 4. Diabetes Mellitus? no 5. Joint replacements in the past 12 months?no 6. Major health problems in the past 3 months?no 7. Any artificial heart valves, MVP, or defibrillator?no    MEDICATIONS & ALLERGIES:    Patient reports the following regarding taking any anticoagulation/antiplatelet therapy:   Plavix, Coumadin, Eliquis, Xarelto, Lovenox, Pradaxa, Brilinta, or Effient? no Aspirin? no  Patient confirms/reports the following medications:  Current Outpatient Medications  Medication Sig Dispense Refill  . fluticasone (FLONASE) 50 MCG/ACT nasal spray SPRAY 2 SPRAYS INTO EACH NOSTRIL EVERY DAY 48 mL 1  . Multiple Vitamin (MULTIVITAMIN) tablet Take 1 tablet by mouth daily.    . tamsulosin (FLOMAX) 0.4 MG CAPS capsule Take 1 capsule (0.4 mg total) by mouth daily. 90 capsule 3  . tiZANidine (ZANAFLEX) 4 MG tablet TAKE 1 TABLET(4 MG) BY MOUTH EVERY 6 HOURS AS NEEDED FOR MUSCLE SPASMS 90 tablet 0  . traZODone (DESYREL) 50 MG tablet Take 0.5-1 tablets (25-50 mg total) by mouth at bedtime. 90 tablet 1   No current facility-administered medications for this visit.    Patient confirms/reports the following allergies:  Allergies  Allergen Reactions  . Acyclovir And Related     No orders of the defined types were placed in this encounter.   AUTHORIZATION INFORMATION Primary Insurance: 1D#: Group #:  Secondary Insurance: 1D#: Group #:  SCHEDULE INFORMATION: Date: 07/12/20 Time: Location:[facility]

## 2020-05-23 ENCOUNTER — Telehealth: Payer: Self-pay | Admitting: Family Medicine

## 2020-05-23 NOTE — Telephone Encounter (Signed)
Copied from [facility] [id number]. Topic: General - Other >> May 23, 2020 11:27 AM Yvette Rack wrote: Reason for CRM: Pt stated he was contacted by the GI office regarding colonoscopy but his insurance notified him that their minimum age is 48 years old so he does not qualify and would be responsible for entire cost. Pt requests call back to discuss other options as he can not afford to pay the entire cost.

## 2020-05-28 ENCOUNTER — Telehealth: Payer: Self-pay

## 2020-05-28 NOTE — Telephone Encounter (Signed)
Patient lvm for someone to call him in regards to his colonoscopy.  Call was returned.  Pt states that he contacted his insurance and they informed him that his colonoscopy is not covered until the age of 37.  Colonoscopy has been canceled.  Thanks,  [place], [place]

## 2020-07-12 ENCOUNTER — Ambulatory Visit: Admit: 2020-07-12 | Payer: BC Managed Care – PPO | Admitting: Gastroenterology

## 2020-07-12 SURGERY — COLONOSCOPY WITH PROPOFOL
Anesthesia: General

## 2021-03-19 ENCOUNTER — Other Ambulatory Visit: Payer: Self-pay | Admitting: Family Medicine

## 2021-03-19 DIAGNOSIS — G4700 Insomnia, unspecified: Secondary | ICD-10-CM

## 2021-04-28 DIAGNOSIS — L570 Actinic keratosis: Secondary | ICD-10-CM | POA: Diagnosis not present

## 2021-04-28 DIAGNOSIS — D2272 Melanocytic nevi of left lower limb, including hip: Secondary | ICD-10-CM | POA: Diagnosis not present

## 2021-04-28 DIAGNOSIS — D225 Melanocytic nevi of trunk: Secondary | ICD-10-CM | POA: Diagnosis not present

## 2021-04-28 DIAGNOSIS — X32XXXA Exposure to sunlight, initial encounter: Secondary | ICD-10-CM | POA: Diagnosis not present

## 2021-04-28 DIAGNOSIS — D2261 Melanocytic nevi of right upper limb, including shoulder: Secondary | ICD-10-CM | POA: Diagnosis not present

## 2021-04-28 DIAGNOSIS — D2262 Melanocytic nevi of left upper limb, including shoulder: Secondary | ICD-10-CM | POA: Diagnosis not present

## 2021-05-12 NOTE — Progress Notes (Signed)
Name: Gilbert Buchanan   MRN: [id number]    DOB: 1970-06-25   Date:05/13/2021       Progress Note  Subjective  Chief Complaint  Chief Complaint  Patient presents with   Annual Exam    HPI  Patient presents for annual CPE and follow up  GAD: he also has OCD, and history of ADD as a child ( but could not tolerate medication) he has tried multiple medications in the past - but states he could not tolerate the side effects of medications, he is off alprazolam, he also stopped Buspar  Denies depression or suicidal thoughts. He said he was tested for ADD in HS and Ritalin made him feel worse. He stopped Wellbutrin , he states he did not noticed a change in his mood or OCD and prefers not taking medication He prefers staying on trazodone only to help him sleep at night    Hyperlipidemia: he took  Vascepa for over 3 years but he had  to switch to Lovaza because of insurance, he  changed his diet since Fall of 2016, he has been off all medications due to cost . He stopped drinking beer (  Used to drink 20-30 beers on weekends) , started to eat a high protein, high fiber, low carb and low sugar, except that not as consistent with diet lately. We will recheck labs today   The 10-year ASCVD risk score (Arnett DK, et al., 2019) is: 3.5%   Values used to calculate the score:     Age: 50 years     Sex: Male     Is Non-Hispanic African American: No     Diabetic: No     Tobacco smoker: No     Systolic Blood Pressure: 400 mmHg     Is BP treated: No     HDL Cholesterol: 46 mg/dL     Total Cholesterol: 230 mg/dL    Obesity: he was obese since childhood. He lost a lot of weight since fall 2016, doing well, his weight has been stable now, he was  stable between 197 lbs and 203 lbs , he states he tried to gain weight since last year because he was getting tired of people telling him he looked sick.    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   BPH: seen by Dr. Diamantina Providence and he is doing well on Flomax. Last year he was released from his care to get monitored here yearly and I will refill his medications.   IPSS Questionnaire (AUA-7): Over the past month.   1)  How often have you had a sensation of not emptying your bladder completely after you finish urinating?  0 - Not at all  2)  How often have you had to urinate again less than two hours after you finished urinating? 0 - Not at all  3)  How often have you found you stopped and started again several times when you urinated?  0 - Not at all  4) How difficult have you found it to postpone urination?  0 - Not at all  5) How often have you had a weak urinary stream?  0 - Not at all  6) How often have  you had to push or strain to begin urination?  0 - Not at all  7) How many times did you most typically get up to urinate from the time you went to bed until the time you got up in the morning?  1 - 1 time  Total score:  0-7 mildly symptomatic   8-19 moderately symptomatic   20-35 severely symptomatic     Diet: eats a very balanced diet during breakfast and lunch but dinner is whatever he feels like, but does not eat out , usually home make meals.  Exercise: continue 150 minutes per week   Depression: phq 9 is negative Depression screen [facility] 2/9 05/13/2021 05/08/2020 02/29/2020 02/14/2020 01/31/2020  Decreased Interest 0 0 0 0 0  Down, Depressed, Hopeless 0 0 0 0 0  PHQ - 2 Score 0 0 0 0 0  Altered sleeping 0 1 - - -  Tired, decreased energy 0 0 - - -  Change in appetite 0 0 - - -  Feeling bad or failure about yourself  0 0 - - -  Trouble concentrating 0 0 - - -  Moving slowly or fidgety/restless 0 0 - - -  Suicidal thoughts 0 0 - - -  PHQ-9 Score 0 1 - - -  Difficult doing work/chores - - - - -  Some  recent data might be hidden    Hypertension:  BP Readings from Last 3 Encounters:  05/13/21 122/82  05/08/20 122/68  04/24/20 125/78    Obesity: Wt Readings from Last 3 Encounters:  05/13/21 211 lb (95.7 kg)  05/08/20 203 lb 1.6 oz (92.1 kg)  04/24/20 205 lb 8 oz (93.2 kg)   BMI Readings from Last 3 Encounters:  05/13/21 27.09 kg/m  05/08/20 26.08 kg/m  04/24/20 26.38 kg/m     Lipids:  Lab Results  Component Value Date   CHOL 230 (H) 11/10/2019   CHOL 222 (H) 11/04/2018   CHOL 188 04/09/2017   Lab Results  Component Value Date   HDL 46 11/10/2019   HDL 47 11/04/2018   HDL 48 04/09/2017   Lab Results  Component Value Date   LDLCALC 156 (H) 11/10/2019   LDLCALC 150 (H) 11/04/2018   LDLCALC 119 (H) 04/09/2017   Lab Results  Component Value Date   TRIG 150 (H) 11/10/2019   TRIG 128 11/04/2018   TRIG 103 04/09/2017   Lab Results  Component Value Date   CHOLHDL 5.0 (H) 11/10/2019   CHOLHDL 4.7 11/04/2018   CHOLHDL 3.9 04/09/2017   No results found for: LDLDIRECT Glucose:  Glucose  Date Value Ref Range Status  01/24/2013 137 (H) 65 - 99 mg/dL Final   Glucose, Bld  Date Value Ref Range Status  11/10/2019 104 (H) 65 - 99 mg/dL Final    Comment:    .            Fasting reference interval . For someone without known diabetes, a glucose value between 100 and 125 mg/dL is consistent with prediabetes and should be confirmed with a follow-up test. .   11/04/2018 102 (H) 65 - 99 mg/dL Final    Comment:    .            Fasting reference interval . For someone without known diabetes, a glucose value between 100 and 125 mg/dL is consistent with prediabetes and should be confirmed with a follow-up test. .   04/09/2017 108 (H) 65 - 99 mg/dL Final  Comment:    .            Fasting reference interval . For someone without known diabetes, a glucose value between 100 and 125 mg/dL is consistent with prediabetes and should be confirmed with  a follow-up test. .     [place] Office Visit from 05/13/2021 in [facility]  AUDIT-C Score 5      Married STD testing and prevention (HIV/chl/gon/syphilis): 10/19/13 Hep C: 11/04/18  Skin cancer: Discussed monitoring for atypical lesions Colorectal cancer: he has new insurance and is able to have screening done. We will place another referral.   Prostate cancer:   Lab Results  Component Value Date   PSA 1.0 11/10/2019   PSA 0.8 04/09/2017     Lung cancer: Low Dose CT Chest recommended if Age 49-80 years, 30 pack-year currently smoking OR have quit w/in 15years. Patient does not qualify.   AAA: The USPSTF recommends one-time screening with ultrasonography in men ages 26 to 78 years who have ever smoked ECG:  01/24/13  Vaccines:  HPV: up to at age 67 , ask insurance if age between 16-45  Shingrix: 34-64 yo and ask insurance if covered when patient above 28 yo Pneumonia: educated and discussed with patient. WJX:[id number] and discussed with patient.  Advanced Care Planning: A voluntary discussion about advance care planning including the explanation and discussion of advance directives.  Discussed health care proxy and Living will, and the patient was able to identify a health care proxy as wife.  Patient does not have a living will at present time. If patient does have living will, I have requested they bring this to the clinic to be scanned in to their chart.  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   Baker's cyst of knee, right 12/25/2016   Dyslipidemia 11/11/2015   Hyperglycemia 11/11/2015   Chronic constipation 11/11/2015   GAD (generalized anxiety disorder) 11/11/2015   Prostatitis 05/05/2015   BPH with obstruction/lower urinary tract symptoms 05/05/2015    Past Surgical History:  Procedure Laterality Date   HERNIA REPAIR     VARICOCELE EXCISION       Family History  Problem Relation Age of Onset   Kidney disease Mother    Testicular cancer Father    Polycystic kidney disease Other    Heart disease Maternal Grandmother    Stroke Paternal Grandmother    Stroke Paternal Grandfather    Prostate cancer Neg Hx     Social History   Socioeconomic History   Marital status: Married    Spouse name: Angelique   Number of children: 2   Years of education: Not on file   Highest education level: Bachelor's degree (e.g., BA, AB, BS)  Occupational History   Occupation: Engineer, building services   Tobacco Use   Smoking status: Former    Packs/day: 1.00    Years: 25.00    Pack years: 25.00    Types: Cigarettes, Cigars    Start date: 06/09/1987    Quit date: 06/08/2012    Years since quitting: 8.9   Smokeless tobacco: Former    Types: Snuff    Quit date: 05/07/2017   Tobacco comments:    N/A  Vaping Use   Vaping Use: Former   Start date: 06/28/2014   Quit date: 06/08/2016  Substance and Sexual Activity   Alcohol use: Yes    Alcohol/week: 0.0 standard drinks  Drug use: No   Sexual activity: Yes    Partners: Female  Other Topics Concern   Not on file  Social History Narrative   Married, two children at home    He work in [place] now , Engineer, building services    Social Determinants of Health   Financial Resource Strain: Low Risk    Difficulty of Paying Living Expenses: Not hard at all  Food Insecurity: No Food Insecurity   Worried About Charity fundraiser in the Last Year: Never true   Arboriculturist in the Last Year: Never true  Transportation Needs: No Transportation Needs   Lack of Transportation (Medical): No   Lack of Transportation (Non-Medical): No  Physical Activity: Sufficiently Active   Days of Exercise per Week: 3 days   Minutes of Exercise per Session: 60 min  Stress: Stress Concern Present   Feeling of Stress : To some extent  Social Connections: Engineer, building services of Communication with Friends and Family:  Twice a week   Frequency of Social Gatherings with Friends and Family: Once a week   Attends Religious Services: More than 4 times per year   Active Member of [employer] or Organizations: Yes   Attends Archivist Meetings: 1 to 4 times per year   Marital Status: Married  Human resources officer Violence: Not At Risk   Fear of Current or Ex-Partner: No   Emotionally Abused: No   Physically Abused: No   Sexually Abused: No     Current Outpatient Medications:    fluticasone (FLONASE) 50 MCG/ACT nasal spray, SPRAY 2 SPRAYS INTO EACH NOSTRIL EVERY DAY, Disp: 48 mL, Rfl: 1   Multiple Vitamin (MULTIVITAMIN) tablet, Take 1 tablet by mouth daily., Disp: , Rfl:    tamsulosin (FLOMAX) 0.4 MG CAPS capsule, Take 1 capsule (0.4 mg total) by mouth daily., Disp: 90 capsule, Rfl: 3   tiZANidine (ZANAFLEX) 4 MG tablet, TAKE 1 TABLET(4 MG) BY MOUTH EVERY 6 HOURS AS NEEDED FOR MUSCLE SPASMS, Disp: 90 tablet, Rfl: 0   traZODone (DESYREL) 50 MG tablet, TAKE 1/2 TO 1 TABLET(25 TO 50 MG) BY MOUTH AT BEDTIME, Disp: 90 tablet, Rfl: 0  Allergies  Allergen Reactions   Acyclovir And Related      ROS  Constitutional: Negative for fever , positive for mild  weight change.  Respiratory: Negative for cough and shortness of breath.   Cardiovascular: Negative for chest pain or palpitations.  Gastrointestinal: Negative for abdominal pain, no bowel changes.  Musculoskeletal: Negative for gait problem or joint swelling.  Skin: Negative for rash.  Neurological: Negative for dizziness or headache.  No other specific complaints in a complete review of systems (except as listed in HPI above).    Objective  Vitals:   05/13/21 0806  BP: 122/82  Pulse: 71  Resp: 16  Temp: 98.1 F (36.7 C)  SpO2: 98%  Weight: 211 lb (95.7 kg)  Height: 6\' 2"  (1.88 m)    Body mass index is 27.09 kg/m.  Physical Exam  Constitutional: Patient appears well-developed and well-nourished. No distress.  HENT: Head: Normocephalic  and atraumatic. Ears: B TMs ok, no erythema or effusion; Nose: Not done  Mouth/Throat: not done  Eyes: Conjunctivae and EOM are normal. Pupils are equal, round, and reactive to light. No scleral icterus.  Neck: Normal range of motion. Neck supple. No JVD present. No thyromegaly present.  Cardiovascular: Normal rate, regular rhythm and normal heart sounds.  No murmur heard. No BLE edema.  Pulmonary/Chest: Effort normal and breath sounds normal. No respiratory distress. Abdominal: Soft. Bowel sounds are normal, no distension. There is no tenderness. no masses MALE GENITALIA: Normal descended testes bilaterally, no masses palpated, no hernias, no lesions, no discharge RECTAL:enlarged, normal consistency  Musculoskeletal: Normal range of motion, no joint effusions. No gross deformities Neurological: he is alert and oriented to person, place, and time. No cranial nerve deficit. Coordination, balance, strength, speech and gait are normal.  Skin: Skin is warm and dry. No rash noted. No erythema.  Psychiatric: Patient has a normal mood and affect. behavior is normal. Judgment and thought content normal.   Fall Risk: Fall Risk  05/13/2021 05/08/2020 02/29/2020 02/14/2020 01/31/2020  Falls in the past year? 0 0 1 0 0  Number falls in past yr: 0 0 0 0 0  Injury with Fall? 0 0 1 0 0  Risk for fall due to : No Fall Risks - - - -  Follow up Falls prevention discussed - - Falls evaluation completed -      Functional Status Survey: Is the patient deaf or have difficulty hearing?: No Does the patient have difficulty seeing, even when wearing glasses/contacts?: No Does the patient have difficulty concentrating, remembering, or making decisions?: No Does the patient have difficulty walking or climbing stairs?: No Does the patient have difficulty dressing or bathing?: No Does the patient have difficulty doing errands alone such as visiting a doctor's office or shopping?: No    Assessment & Plan  1. Well  adult exam  - Lipid panel - CBC with Differential/Platelet - COMPLETE METABOLIC PANEL WITH GFR - Hemoglobin A1c - PSA  2. Colon cancer screening  - Ambulatory referral to Gastroenterology  3. BPH with obstruction/lower urinary tract symptoms  -tamsulosin (FLOMAX) 0.4 MG CAPS capsule; Take 1 capsule (0.4 mg total) by mouth daily.  Dispense: 90 capsule; Refill: 3 - PSA  4. Dyslipidemia  - Lipid panel  5. Hyperglycemia  - Hemoglobin A1c  6. Intermittent low back pain   7. Insomnia, unspecified type  - traZODone (DESYREL) 50 MG tablet; Take 1 tablet (50 mg total) by mouth at bedtime.  Dispense: 90 tablet; Refill: 3  8. Long-term use of high-risk medication  - CBC with Differential/Platelet - COMPLETE METABOLIC PANEL WITH GFR    -Prostate cancer screening and PSA options (with potential risks and benefits of testing vs not testing) were discussed along with recent recs/guidelines. -USPSTF grade A and B recommendations reviewed with patient; age-appropriate recommendations, preventive care, screening tests, etc discussed and encouraged; healthy living encouraged; see AVS for patient education given to patient -Discussed importance of 150 minutes of physical activity weekly, eat two servings of fish weekly, eat one serving of tree nuts ( cashews, pistachios, pecans, almonds.Marland Kitchen) every other day, eat 6 servings of fruit/vegetables daily and drink plenty of water and avoid sweet beverages.

## 2021-05-13 ENCOUNTER — Ambulatory Visit (INDEPENDENT_AMBULATORY_CARE_PROVIDER_SITE_OTHER): Payer: BC Managed Care – PPO | Admitting: Family Medicine

## 2021-05-13 ENCOUNTER — Encounter: Payer: Self-pay | Admitting: Family Medicine

## 2021-05-13 VITALS — BP 122/82 | HR 71 | Temp 98.1°F | Resp 16 | Ht 74.0 in | Wt 211.0 lb

## 2021-05-13 DIAGNOSIS — N401 Enlarged prostate with lower urinary tract symptoms: Secondary | ICD-10-CM

## 2021-05-13 DIAGNOSIS — G4700 Insomnia, unspecified: Secondary | ICD-10-CM

## 2021-05-13 DIAGNOSIS — Z0000 Encounter for general adult medical examination without abnormal findings: Secondary | ICD-10-CM

## 2021-05-13 DIAGNOSIS — E785 Hyperlipidemia, unspecified: Secondary | ICD-10-CM | POA: Diagnosis not present

## 2021-05-13 DIAGNOSIS — N138 Other obstructive and reflux uropathy: Secondary | ICD-10-CM

## 2021-05-13 DIAGNOSIS — R739 Hyperglycemia, unspecified: Secondary | ICD-10-CM | POA: Diagnosis not present

## 2021-05-13 DIAGNOSIS — M545 Low back pain, unspecified: Secondary | ICD-10-CM | POA: Diagnosis not present

## 2021-05-13 DIAGNOSIS — Z1211 Encounter for screening for malignant neoplasm of colon: Secondary | ICD-10-CM | POA: Diagnosis not present

## 2021-05-13 DIAGNOSIS — Z79899 Other long term (current) drug therapy: Secondary | ICD-10-CM | POA: Diagnosis not present

## 2021-05-13 MED ORDER — TRAZODONE HCL 50 MG PO TABS: 50.0000 mg | ORAL_TABLET | Freq: Every day | ORAL | 3 refills | Status: DC

## 2021-05-13 MED ORDER — TAMSULOSIN HCL 0.4 MG PO CAPS: 0.4000 mg | ORAL_CAPSULE | Freq: Every day | ORAL | 3 refills | Status: DC

## 2021-05-13 NOTE — Patient Instructions (Signed)

## 2021-05-14 LAB — CBC WITH DIFFERENTIAL/PLATELET
Absolute Monocytes: 312 cells/uL (ref 200–950)
Basophils Absolute: 19 cells/uL (ref 0–200)
Basophils Relative: 0.5 %
Eosinophils Absolute: 42 cells/uL (ref 15–500)
Eosinophils Relative: 1.1 %
HCT: 43.9 % (ref 38.5–50.0)
Hemoglobin: 15.2 g/dL (ref 13.2–17.1)
Lymphs Abs: 1140 cells/uL (ref 850–3900)
MCH: 32.8 pg (ref 27.0–33.0)
MCHC: 34.6 g/dL (ref 32.0–36.0)
MCV: 94.8 fL (ref 80.0–100.0)
MPV: 11.1 fL (ref 7.5–12.5)
Monocytes Relative: 8.2 %
Neutro Abs: 2288 cells/uL (ref 1500–7800)
Neutrophils Relative %: 60.2 %
Platelets: 191 10*3/uL (ref 140–400)
RBC: 4.63 10*6/uL (ref 4.20–5.80)
RDW: 11.7 % (ref 11.0–15.0)
Total Lymphocyte: 30 %
WBC: 3.8 10*3/uL (ref 3.8–10.8)

## 2021-05-14 LAB — LIPID PANEL
Cholesterol: 204 mg/dL — ABNORMAL HIGH (ref <200)
HDL: 42 mg/dL (ref >=40)
LDL Cholesterol (Calc): 137 mg/dL (calc) — ABNORMAL HIGH
Non-HDL Cholesterol (Calc): 162 mg/dL (calc) — ABNORMAL HIGH (ref <130)
Total CHOL/HDL Ratio: 4.9 (calc) (ref <5.0)
Triglycerides: 126 mg/dL (ref <150)

## 2021-05-14 LAB — COMPLETE METABOLIC PANEL WITH GFR
AG Ratio: 2.1 (calc) (ref 1.0–2.5)
ALT: 24 U/L (ref 9–46)
AST: 21 U/L (ref 10–40)
Albumin: 4.4 g/dL (ref 3.6–5.1)
Alkaline phosphatase (APISO): 35 U/L — ABNORMAL LOW (ref 36–130)
BUN: 14 mg/dL (ref 7–25)
CO2: 33 mmol/L — ABNORMAL HIGH (ref 20–32)
Calcium: 9.6 mg/dL (ref 8.6–10.3)
Chloride: 102 mmol/L (ref 98–110)
Creat: 1.04 mg/dL (ref 0.60–1.29)
Globulin: 2.1 g/dL (calc) (ref 1.9–3.7)
Glucose, Bld: 98 mg/dL (ref 65–99)
Potassium: 4.5 mmol/L (ref 3.5–5.3)
Sodium: 140 mmol/L (ref 135–146)
Total Bilirubin: 0.8 mg/dL (ref 0.2–1.2)
Total Protein: 6.5 g/dL (ref 6.1–8.1)
eGFR: 89 mL/min/{1.73_m2} (ref >=60)

## 2021-05-14 LAB — HEMOGLOBIN A1C
Hgb A1c MFr Bld: 5.2 % of total Hgb (ref <5.7)
Mean Plasma Glucose: 103 mg/dL
eAG (mmol/L): 5.7 mmol/L

## 2021-05-14 LAB — PSA: PSA: 1.47 ng/mL (ref <=4.00)

## 2021-05-22 ENCOUNTER — Telehealth: Payer: Self-pay

## 2021-05-22 NOTE — Telephone Encounter (Signed)
CALLED PATIENT NO ANSWER LEFT VOICEMAIL FOR A CALL BACK ? ?

## 2021-05-30 ENCOUNTER — Telehealth: Payer: Self-pay

## 2021-05-30 NOTE — Telephone Encounter (Signed)
Inbound call from pt requesting a call back to schedule his colonoscopy. Thank you.

## 2021-06-03 NOTE — Telephone Encounter (Signed)
Returned patients call. LVM to call back °

## 2021-06-16 ENCOUNTER — Telehealth: Payer: Self-pay

## 2021-06-16 NOTE — Telephone Encounter (Signed)
Patient is ready to schedule procedure. Clinical staff will follow up with patient. °

## 2021-06-17 ENCOUNTER — Other Ambulatory Visit: Payer: Self-pay

## 2021-06-17 DIAGNOSIS — Z1211 Encounter for screening for malignant neoplasm of colon: Secondary | ICD-10-CM

## 2021-06-17 MED ORDER — PEG 3350-KCL-NA BICARB-NACL 420 G PO SOLR: 4000.0000 mL | Freq: Once | ORAL | 0 refills | Status: AC

## 2021-06-17 NOTE — Telephone Encounter (Signed)
Procedure has been rescheduled to 07/02/2021. Updated instructions will be mailed out. Bowel prep sent to the [place].

## 2021-06-17 NOTE — Progress Notes (Signed)
Gastroenterology Pre-Procedure Review  Request Date: 07/02/2021 Requesting Physic/ian: Dr. Vicente Males  PATIENT REVIEW QUESTIONS: The patient responded to the following health history questions as indicated:    1. Are you having any GI issues? no 2. Do you have a personal history of Polyps? no 3. Do you have a family history of Colon Cancer or Polyps? no 4. Diabetes Mellitus? no 5. Joint replacements in the past 12 months?no 6. Major health problems in the past 3 months?no 7. Any artificial heart valves, MVP, or defibrillator?no    MEDICATIONS & ALLERGIES:    Patient reports the following regarding taking any anticoagulation/antiplatelet therapy:   Plavix, Coumadin, Eliquis, Xarelto, Lovenox, Pradaxa, Brilinta, or Effient? no Aspirin? no  Patient confirms/reports the following medications:  Current Outpatient Medications  Medication Sig Dispense Refill   fluticasone (FLONASE) 50 MCG/ACT nasal spray SPRAY 2 SPRAYS INTO EACH NOSTRIL EVERY DAY 48 mL 1   Multiple Vitamin (MULTIVITAMIN) tablet Take 1 tablet by mouth daily.     tamsulosin (FLOMAX) 0.4 MG CAPS capsule Take 1 capsule (0.4 mg total) by mouth daily. 90 capsule 3   traZODone (DESYREL) 50 MG tablet Take 1 tablet (50 mg total) by mouth at bedtime. 90 tablet 3   No current facility-administered medications for this visit.    Patient confirms/reports the following allergies:  Allergies  Allergen Reactions   Acyclovir And Related     No orders of the defined types were placed in this encounter.   AUTHORIZATION INFORMATION Primary Insurance: 1D#: Group #:  Secondary Insurance: 1D#: Group #:  SCHEDULE INFORMATION: Date: 07/02/2021 Time: Location: [place]

## 2021-06-26 ENCOUNTER — Telehealth: Payer: Self-pay

## 2021-06-26 NOTE — Telephone Encounter (Signed)
Returned patients call. Patient had questions regarding insurance approval. Explained to patient insurance does not need prior auth. Should be covered at 100%. Pt verbalized understanding.

## 2021-07-02 ENCOUNTER — Encounter
Admission: RE | Disposition: A | Discharge status: Home / Self Care | Payer: Self-pay | Source: Home / Self Care | Attending: Gastroenterology

## 2021-07-02 ENCOUNTER — Ambulatory Visit
Admission: RE | Admit: 2021-07-02 | Discharge: 2021-07-02 | Disposition: A | Discharge status: Home / Self Care | Payer: BC Managed Care – PPO | Attending: Gastroenterology | Admitting: Gastroenterology

## 2021-07-02 ENCOUNTER — Ambulatory Visit: Payer: BC Managed Care – PPO | Admitting: Anesthesiology

## 2021-07-02 ENCOUNTER — Encounter: Payer: Self-pay | Admitting: Gastroenterology

## 2021-07-02 DIAGNOSIS — K635 Polyp of colon: Secondary | ICD-10-CM

## 2021-07-02 DIAGNOSIS — D122 Benign neoplasm of ascending colon: Secondary | ICD-10-CM | POA: Insufficient documentation

## 2021-07-02 DIAGNOSIS — Z87891 Personal history of nicotine dependence: Secondary | ICD-10-CM | POA: Diagnosis not present

## 2021-07-02 DIAGNOSIS — D124 Benign neoplasm of descending colon: Secondary | ICD-10-CM | POA: Insufficient documentation

## 2021-07-02 DIAGNOSIS — Z1211 Encounter for screening for malignant neoplasm of colon: Secondary | ICD-10-CM | POA: Insufficient documentation

## 2021-07-02 DIAGNOSIS — E785 Hyperlipidemia, unspecified: Secondary | ICD-10-CM | POA: Diagnosis not present

## 2021-07-02 DIAGNOSIS — K5730 Diverticulosis of large intestine without perforation or abscess without bleeding: Secondary | ICD-10-CM | POA: Diagnosis not present

## 2021-07-02 DIAGNOSIS — D125 Benign neoplasm of sigmoid colon: Secondary | ICD-10-CM | POA: Insufficient documentation

## 2021-07-02 HISTORY — PX: COLONOSCOPY WITH PROPOFOL: SHX5780

## 2021-07-02 SURGERY — COLONOSCOPY WITH PROPOFOL
Anesthesia: General

## 2021-07-02 MED ORDER — SODIUM CHLORIDE 0.9 % IV SOLN: INTRAVENOUS | Status: DC

## 2021-07-02 MED ORDER — PROPOFOL 10 MG/ML IV BOLUS
INTRAVENOUS | Status: DC | PRN
Start: 2021-07-02 — End: 2021-07-02
  Administered 2021-07-02 (×2): 20 mg via INTRAVENOUS
  Administered 2021-07-02: 80 mg via INTRAVENOUS

## 2021-07-02 MED ORDER — LIDOCAINE HCL (CARDIAC) PF 100 MG/5ML IV SOSY
PREFILLED_SYRINGE | INTRAVENOUS | Status: DC | PRN
  Administered 2021-07-02: 50 mg via INTRAVENOUS

## 2021-07-02 MED ORDER — LIDOCAINE HCL (PF) 2 % IJ SOLN
INTRAMUSCULAR | Status: AC
  Filled 2021-07-02: qty 5

## 2021-07-02 MED ORDER — DEXMEDETOMIDINE HCL IN NACL 200 MCG/50ML IV SOLN
INTRAVENOUS | Status: AC
  Filled 2021-07-02: qty 50

## 2021-07-02 MED ORDER — PROPOFOL 500 MG/50ML IV EMUL
INTRAVENOUS | Status: AC
  Filled 2021-07-02: qty 50

## 2021-07-02 MED ORDER — DEXMEDETOMIDINE (PRECEDEX) IN NS 20 MCG/5ML (4 MCG/ML) IV SYRINGE
PREFILLED_SYRINGE | INTRAVENOUS | Status: DC | PRN
  Administered 2021-07-02: 8 ug via INTRAVENOUS

## 2021-07-02 MED ORDER — PROPOFOL 500 MG/50ML IV EMUL
INTRAVENOUS | Status: DC | PRN
  Administered 2021-07-02: 150 ug/kg/min via INTRAVENOUS

## 2021-07-02 NOTE — H&P (Signed)
Jonathon Bellows, MD [address], [place], [place], [place], [address] [address], [place], [place], [place], [address] Phone: [phone]  Fax: [phone]  Primary Care Physician:  Steele Sizer, MD   Pre-Procedure History & Physical: HPI:  Gilbert Buchanan is a 51 y.o. male is here for an colonoscopy.   Past Medical History:  Diagnosis Date   Anxiety    Elevated blood pressure (not hypertension)    History of Lyme disease    Hyperlipidemia    Incomplete bladder emptying    Over weight    Prostatitis    Stones, prostate     Past Surgical History:  Procedure Laterality Date   HERNIA REPAIR     VARICOCELE EXCISION      Prior to Admission medications   Medication Sig Start Date End Date Taking? Authorizing Provider  fluticasone (FLONASE) 50 MCG/ACT nasal spray SPRAY 2 SPRAYS INTO EACH NOSTRIL EVERY DAY 01/15/20  Yes Sowles, Drue Stager, MD  Multiple Vitamin (MULTIVITAMIN) tablet Take 1 tablet by mouth daily.   Yes [provider]  tamsulosin (FLOMAX) 0.4 MG CAPS capsule Take 1 capsule (0.4 mg total) by mouth daily. 05/13/21  Yes Sowles, Drue Stager, MD  traZODone (DESYREL) 50 MG tablet Take 1 tablet (50 mg total) by mouth at bedtime. 05/13/21  Yes Steele Sizer, MD    Allergies as of 06/17/2021 - Review Complete 05/13/2021  Allergen Reaction Noted   Acyclovir and related  12/05/2014    Family History  Problem Relation Age of Onset   Kidney disease Mother    Testicular cancer Father    Polycystic kidney disease Other    Heart disease Maternal Grandmother    Stroke Paternal Grandmother    Stroke Paternal Grandfather    Prostate cancer Neg Hx     Social History   Socioeconomic History   Marital status: Married    Spouse name: Angelique   Number of children: 2   Years of education: Not on file   Highest education level: Bachelor's degree (e.g., BA, AB, BS)  Occupational History   Occupation: Engineer, building services   Tobacco Use   Smoking status: Former     Packs/day: 1.00    Years: 25.00    Pack years: 25.00    Types: Cigarettes, Cigars    Start date: 06/09/1987    Quit date: 06/08/2012    Years since quitting: 9.0   Smokeless tobacco: Former    Types: Snuff    Quit date: 05/07/2017   Tobacco comments:    N/A  Vaping Use   Vaping Use: Former   Start date: 06/28/2014   Quit date: 06/08/2016  Substance and Sexual Activity   Alcohol use: Yes    Alcohol/week: 0.0 standard drinks   Drug use: No   Sexual activity: Yes    Partners: Female  Other Topics Concern   Not on file  Social History Narrative   Married, two children at home    He work in [place] now , Engineer, building services    Social Determinants of Health   Financial Resource Strain: Low Risk    Difficulty of Paying Living Expenses: Not hard at all  Food Insecurity: No Food Insecurity   Worried About Charity fundraiser in the Last Year: Never true   Arboriculturist in the Last Year: Never true  Transportation Needs: No Transportation Needs   Lack of Transportation (Medical): No   Lack of Transportation (Non-Medical): No  Physical Activity: Sufficiently Active  Days of Exercise per Week: 3 days   Minutes of Exercise per Session: 60 min  Stress: Stress Concern Present   Feeling of Stress : To some extent  Social Connections: Engineer, building services of Communication with Friends and Family: Twice a week   Frequency of Social Gatherings with Friends and Family: Once a week   Attends Religious Services: More than 4 times per year   Active Member of [employer] or Organizations: Yes   Attends Archivist Meetings: 1 to 4 times per year   Marital Status: Married  Human resources officer Violence: Not At Risk   Fear of Current or Ex-Partner: No   Emotionally Abused: No   Physically Abused: No   Sexually Abused: No    Review of Systems: See HPI, otherwise negative ROS  Physical Exam: BP (!) 142/86    Pulse 61    Temp (!) 96 F (35.6 C) (Temporal)    Resp 16    Ht 6\' 2"   (1.88 m)    Wt 95.3 kg    SpO2 100%    BMI 26.96 kg/m  General:   Alert,  pleasant and cooperative in NAD Head:  Normocephalic and atraumatic. Neck:  Supple; no masses or thyromegaly. Lungs:  Clear throughout to auscultation, normal respiratory effort.    Heart:  +S1, +S2, Regular rate and rhythm, No edema. Abdomen:  Soft, nontender and nondistended. Normal bowel sounds, without guarding, and without rebound.   Neurologic:  Alert and  oriented x4;  grossly normal neurologically.  Impression/Plan: Gilbert Buchanan is here for an colonoscopy to be performed for Screening colonoscopy average risk   Risks, benefits, limitations, and alternatives regarding  colonoscopy have been reviewed with the patient.  Questions have been answered.  All parties agreeable.   Jonathon Bellows, MD  07/02/2021, 9:57 AM

## 2021-07-02 NOTE — Anesthesia Procedure Notes (Signed)
Date/Time: 07/02/2021 10:03 AM Performed by: Johnna Acosta, CRNA Pre-anesthesia Checklist: Patient identified, Emergency Drugs available, Suction available, Patient being monitored and Timeout performed Patient Re-evaluated:Patient Re-evaluated prior to induction Oxygen Delivery Method: Nasal cannula Preoxygenation: Pre-oxygenation with 100% oxygen Induction Type: IV induction

## 2021-07-02 NOTE — Anesthesia Preprocedure Evaluation (Signed)
Anesthesia Evaluation  Patient identified by MRN, date of birth, ID band Patient awake    Reviewed: Allergy & Precautions, NPO status , Patient's Chart, lab work & pertinent test results  History of Anesthesia Complications Negative for: history of anesthetic complications  Airway Mallampati: III  TM Distance: >3 FB Neck ROM: full    Dental  (+) Chipped   Pulmonary neg shortness of breath, former smoker,    Pulmonary exam normal        Cardiovascular (-) angina(-) Past MI negative cardio ROS Normal cardiovascular exam     Neuro/Psych PSYCHIATRIC DISORDERS negative neurological ROS     GI/Hepatic negative GI ROS, Neg liver ROS, neg GERD  ,  Endo/Other  negative endocrine ROS  Renal/GU negative Renal ROS  negative genitourinary   Musculoskeletal   Abdominal   Peds  Hematology negative hematology ROS (+)   Anesthesia Other Findings Past Medical History: No date: Anxiety No date: Elevated blood pressure (not hypertension) No date: History of Lyme disease No date: Hyperlipidemia No date: Incomplete bladder emptying No date: Over weight No date: Prostatitis 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, prostate  Past Surgical History: No date: HERNIA REPAIR No date: VARICOCELE EXCISION  BMI    Body Mass Index: 26.96 kg/m      Reproductive/Obstetrics negative OB ROS                             Anesthesia Physical Anesthesia Plan  ASA: 3  Anesthesia Plan: General   Post-op Pain Management:    Induction: Intravenous  PONV Risk Score and Plan: Propofol infusion and TIVA  Airway Management Planned: Natural Airway and Nasal Cannula  Additional Equipment:   Intra-op Plan:   Post-operative Plan:   Informed Consent: I have reviewed the patients History and Physical, chart, labs and discussed the procedure including the risks, benefits and alternatives for the proposed anesthesia with the patient or  authorized representative who has indicated his/her understanding and acceptance.     Dental Advisory Given  Plan Discussed with: Anesthesiologist, CRNA and Surgeon  Anesthesia Plan Comments: (Patient consented for risks of anesthesia including but not limited to:  - adverse reactions to medications - risk of airway placement if required - damage to eyes, teeth, lips or other oral mucosa - nerve damage due to positioning  - sore throat or hoarseness - Damage to heart, brain, nerves, lungs, other parts of body or loss of life  Patient voiced understanding.)        Anesthesia Quick Evaluation

## 2021-07-02 NOTE — Op Note (Signed)
[facility] Gastroenterology Patient Name: Gilbert Buchanan Procedure Date: 07/02/2021 10:02 AM MRN: [id number] Account #: [id number] Date of Birth: 04/01/1971 Admit Type: Outpatient Age: 51 Room: [facility] ENDO ROOM 3 Gender: Male Note Status: Finalized Instrument Name: Park Meo [id number] Procedure:             Colonoscopy Indications:           Screening for colorectal malignant neoplasm Providers:             Jonathon Bellows MD, MD Referring MD:          Bethena Roys. Sowles, MD (Referring MD) Medicines:             Monitored Anesthesia Care Complications:         No immediate complications. Procedure:             Pre-Anesthesia Assessment:                        - Prior to the procedure, a History and Physical was                         performed, and patient medications, allergies and                         sensitivities were reviewed. The patient's tolerance                         of previous anesthesia was reviewed.                        - The risks and benefits of the procedure and the                         sedation options and risks were discussed with the                         patient. All questions were answered and informed                         consent was obtained.                        - ASA Grade Assessment: II - A patient with mild                         systemic disease.                        After obtaining informed consent, the colonoscope was                         passed under direct vision. Throughout the procedure,                         the patient's blood pressure, pulse, and oxygen                         saturations were monitored continuously. The                         Colonoscope was  introduced through the anus and                         advanced to the the cecum, identified by the                         appendiceal orifice. The colonoscopy was performed                         with ease. The patient tolerated the procedure well.                          The quality of the bowel preparation was good. Findings:      The perianal and digital rectal examinations were normal.      Two sessile polyps were found in the ascending colon. The polyps were 4       to 5 mm in size. These polyps were removed with a cold snare. Resection       and retrieval were complete.      A 5 mm polyp was found in the descending colon. The polyp was sessile.       The polyp was removed with a cold snare. Resection and retrieval were       complete.      Two sessile polyps were found in the sigmoid colon. The polyps were 5 to       6 mm in size. These polyps were removed with a cold snare. Resection and       retrieval were complete.      Multiple small-mouthed diverticula were found in the sigmoid colon.      The exam was otherwise without abnormality on direct and retroflexion       views. Impression:            - Two 4 to 5 mm polyps in the ascending colon, removed                         with a cold snare. Resected and retrieved.                        - One 5 mm polyp in the descending colon, removed with                         a cold snare. Resected and retrieved.                        - Two 5 to 6 mm polyps in the sigmoid colon, removed                         with a cold snare. Resected and retrieved.                        - Diverticulosis in the sigmoid colon.                        - The examination was otherwise normal on direct and                         retroflexion views. Recommendation:        -  Discharge patient to home (with escort).                        - Resume previous diet.                        - Continue present medications.                        - Await pathology results.                        - Repeat colonoscopy for surveillance based on                         pathology results. Procedure Code(s):     --- Professional ---                        [phone], Colonoscopy, flexible; with removal of                          tumor(s), polyp(s), or other lesion(s) by snare                         technique Diagnosis Code(s):     --- Professional ---                        K63.5, Polyp of colon                        Z12.11, Encounter for screening for malignant neoplasm                         of colon                        K57.30, Diverticulosis of large intestine without                         perforation or abscess without bleeding CPT copyright 2019 [employer] Medical Association. All rights reserved. The codes documented in this report are preliminary and upon coder review may  be revised to meet current compliance requirements. Jonathon Bellows, MD Jonathon Bellows MD, MD 07/02/2021 10:27:18 AM This report has been signed electronically. Number of Addenda: 0 Note Initiated On: 07/02/2021 10:02 AM Scope Withdrawal Time: 0 hours 13 minutes 37 seconds  Total Procedure Duration: 0 hours 19 minutes 53 seconds  Estimated Blood Loss:  Estimated blood loss: none.      [facility]

## 2021-07-02 NOTE — Anesthesia Postprocedure Evaluation (Signed)
Anesthesia Post Note  Patient: Gilbert Buchanan  Procedure(s) Performed: COLONOSCOPY WITH PROPOFOL  Patient location during evaluation: Endoscopy Anesthesia Type: General Level of consciousness: awake and alert Pain management: pain level controlled Vital Signs Assessment: post-procedure vital signs reviewed and stable Respiratory status: spontaneous breathing, nonlabored ventilation, respiratory function stable and patient connected to nasal cannula oxygen Cardiovascular status: blood pressure returned to baseline and stable Postop Assessment: no apparent nausea or vomiting Anesthetic complications: no   No notable events documented.   Last Vitals:  Vitals:   07/02/21 1031 07/02/21 1058  BP: 111/80 113/86  Pulse: (!) 36   Resp: 15   Temp:    SpO2: 96%     Last Pain:  Vitals:   07/02/21 1058  TempSrc:   PainSc: 0-No pain                 Precious Haws Ariela Mochizuki

## 2021-07-02 NOTE — Transfer of Care (Signed)
Immediate Anesthesia Transfer of Care Note  Patient: Gilbert Buchanan  Procedure(s) Performed: COLONOSCOPY WITH PROPOFOL  Patient Location: PACU  Anesthesia Type:General  Level of Consciousness: awake, alert  and oriented  Airway & Oxygen Therapy: Patient Spontanous Breathing  Post-op Assessment: Report given to RN and Post -op Vital signs reviewed and stable  Post vital signs: Reviewed and stable  Last Vitals:  Vitals Value Taken Time  BP 111/80 07/02/21 1031  Temp    Pulse 61 07/02/21 1031  Resp 15 07/02/21 1031  SpO2 100 % 07/02/21 1031  Vitals shown include unvalidated device data.  Last Pain:  Vitals:   07/02/21 1028  TempSrc:   PainSc: 0-No pain         Complications: No notable events documented.

## 2021-07-03 ENCOUNTER — Encounter: Payer: Self-pay | Admitting: Gastroenterology

## 2021-07-03 LAB — SURGICAL PATHOLOGY

## 2021-07-07 ENCOUNTER — Encounter: Payer: Self-pay | Admitting: Gastroenterology

## 2022-03-30 ENCOUNTER — Telehealth: Payer: Self-pay

## 2022-03-30 NOTE — Telephone Encounter (Signed)
Copied from [place] [phone]. Topic: General - Other >> Mar 30, 2022  1:08 PM Oley Balm E wrote: Reason for CRM: Pt wants a call back regarding a [employer] application.  Best contact: [phone]

## 2022-04-02 ENCOUNTER — Encounter: Payer: Self-pay | Admitting: Family Medicine

## 2022-04-02 NOTE — Telephone Encounter (Signed)
Letter has been written and signed by Dr. Ancil Boozer.  Voicemail was left for patient to come by and pick up.  Copy is also in chart under letters tab.

## 2022-04-08 DIAGNOSIS — D2261 Melanocytic nevi of right upper limb, including shoulder: Secondary | ICD-10-CM | POA: Diagnosis not present

## 2022-04-08 DIAGNOSIS — D2272 Melanocytic nevi of left lower limb, including hip: Secondary | ICD-10-CM | POA: Diagnosis not present

## 2022-04-08 DIAGNOSIS — D2262 Melanocytic nevi of left upper limb, including shoulder: Secondary | ICD-10-CM | POA: Diagnosis not present

## 2022-04-08 DIAGNOSIS — D225 Melanocytic nevi of trunk: Secondary | ICD-10-CM | POA: Diagnosis not present

## 2022-04-11 IMAGING — CT CT HEAD W/O CM
1 series · 16 of 30 positions shown, 20 images · non-contrast
Comparison: None.

CLINICAL DATA: Head trauma.

EXAM:
CT HEAD WITHOUT CONTRAST
TECHNIQUE: Contiguous axial images were obtained from the base of the skull
through the vertex without intravenous contrast.

[Series 2: head wo · axial · 0.49mm/px · z∈[-202,-68]mm · 16 of 31 slices shown, 20 images]
[im 2/31  brain]
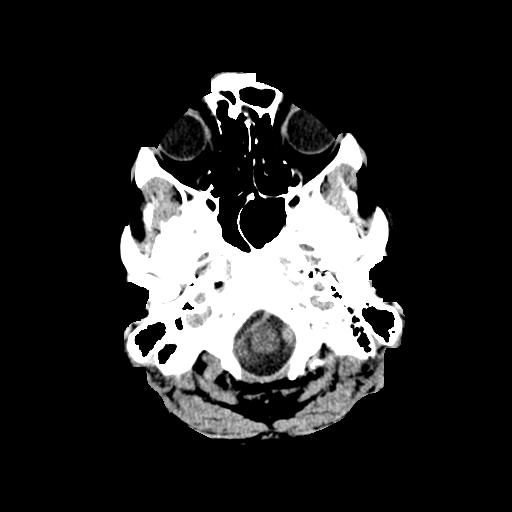
[im 2/31  bone]
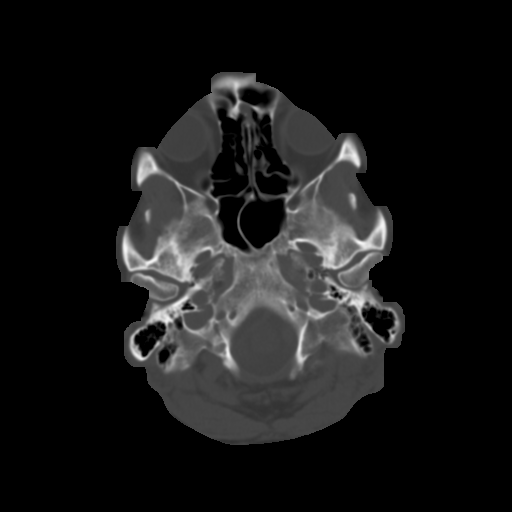
[im 4/31  brain]
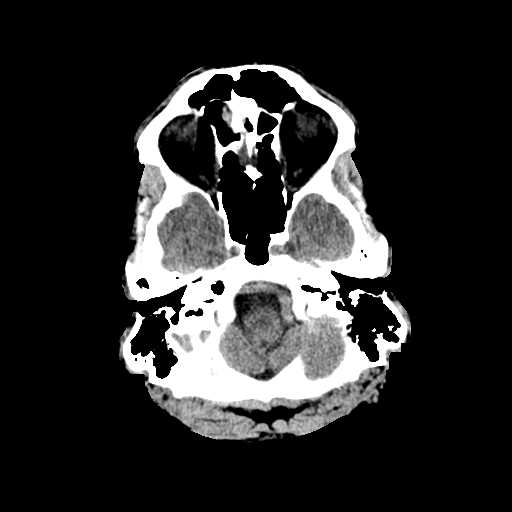
[im 6/31  brain]
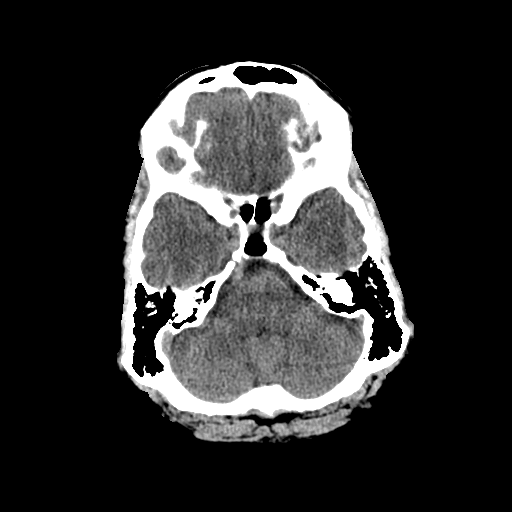
[im 8/31  brain]
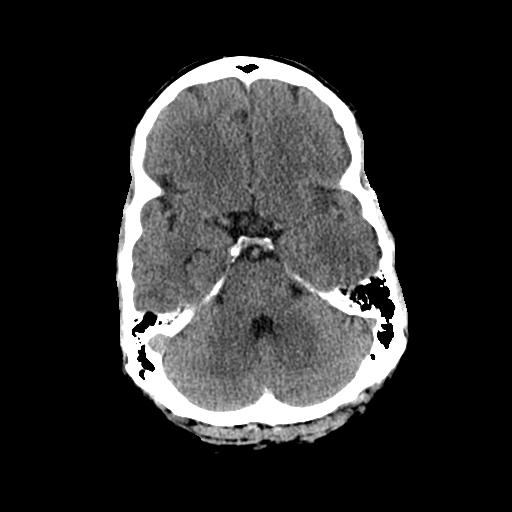
[im 9/31  brain]
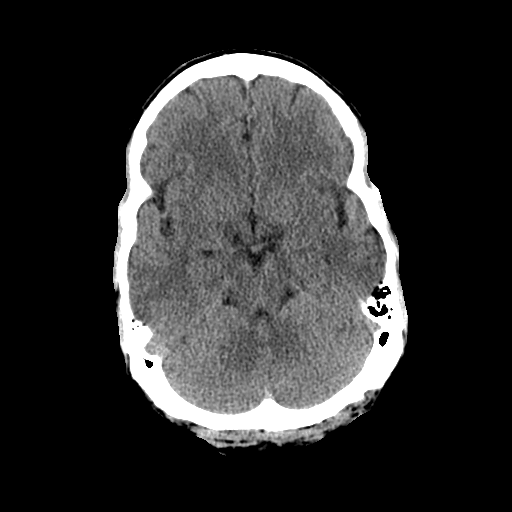
[im 9/31  bone]
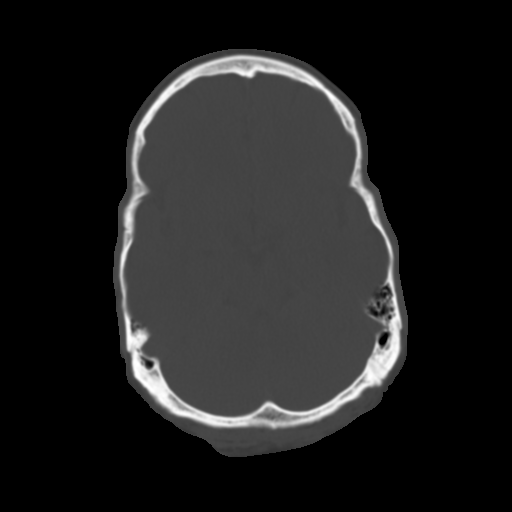
[im 11/31  brain]
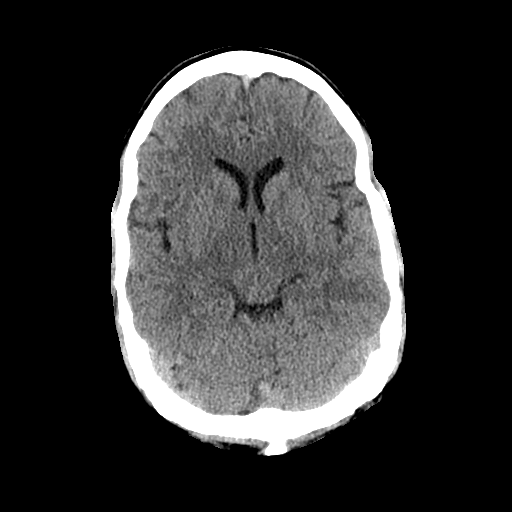
[im 13/31  brain]
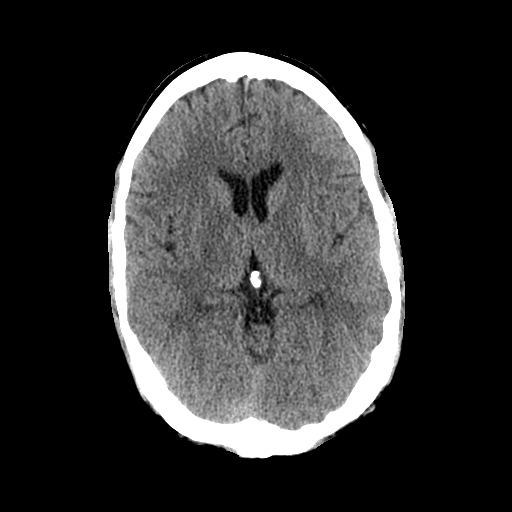
[im 15/31  brain]
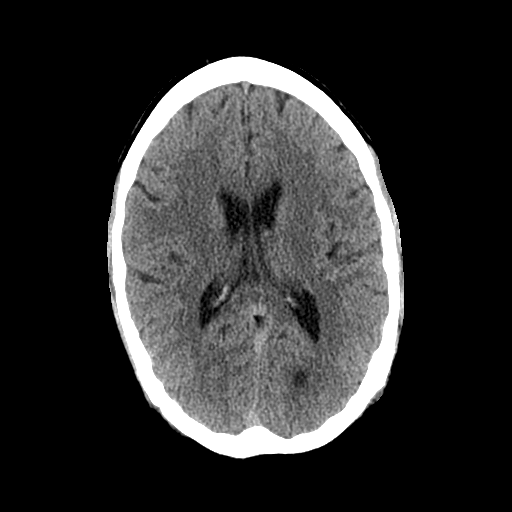
[im 16/31  brain]
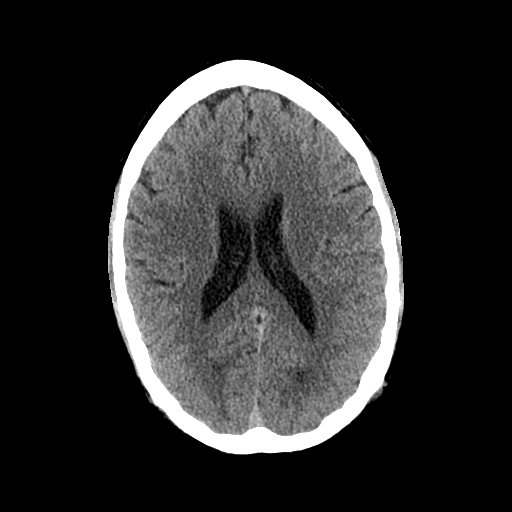
[im 16/31  bone]
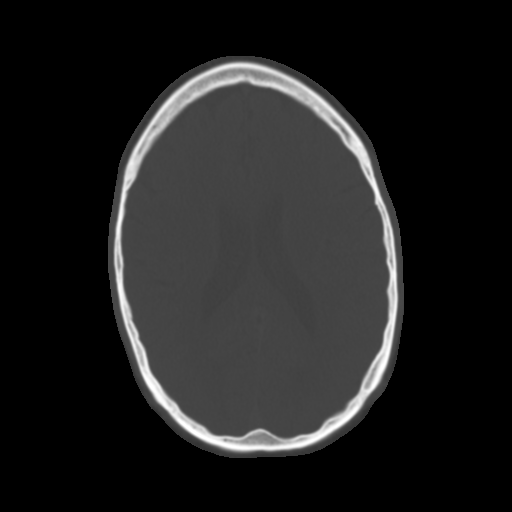
[im 18/31  brain]
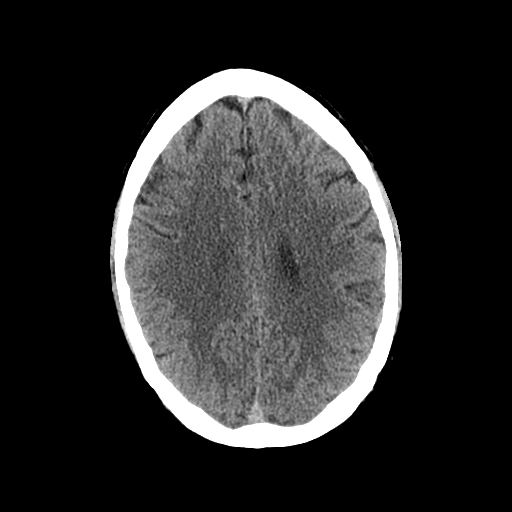
[im 20/31  brain]
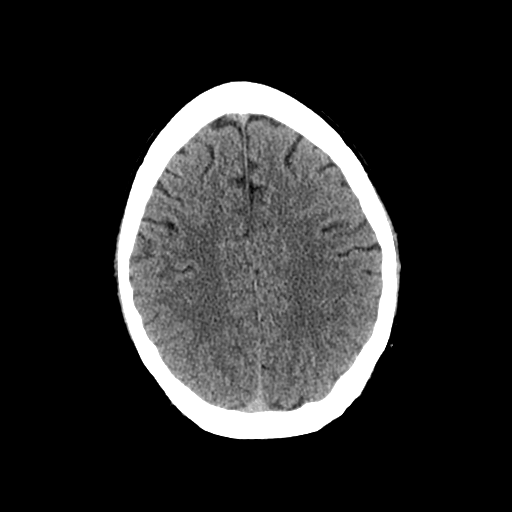
[im 22/31  brain]
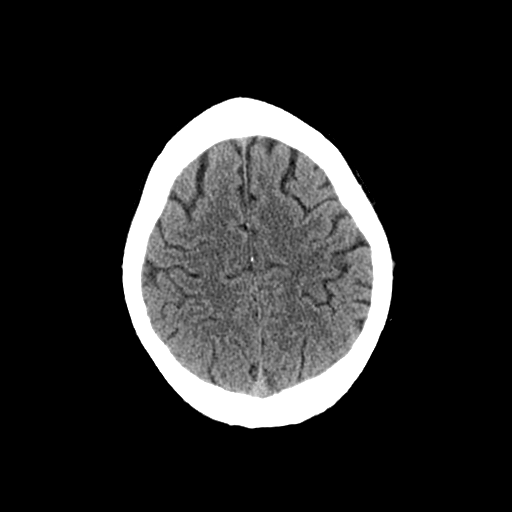
[im 23/31  brain]
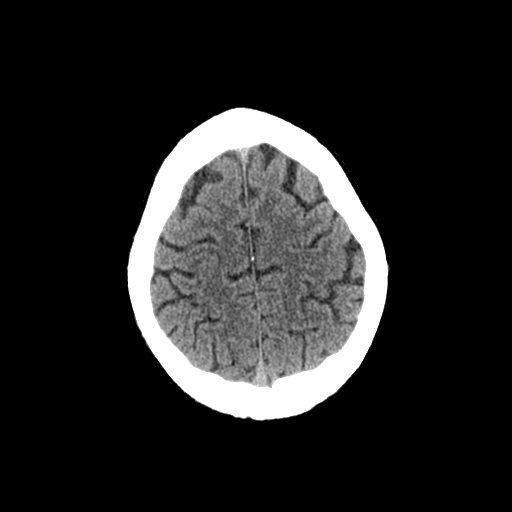
[im 23/31  bone]
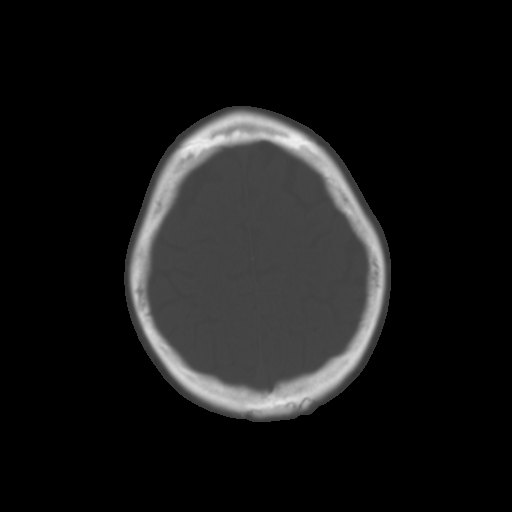
[im 25/31  brain]
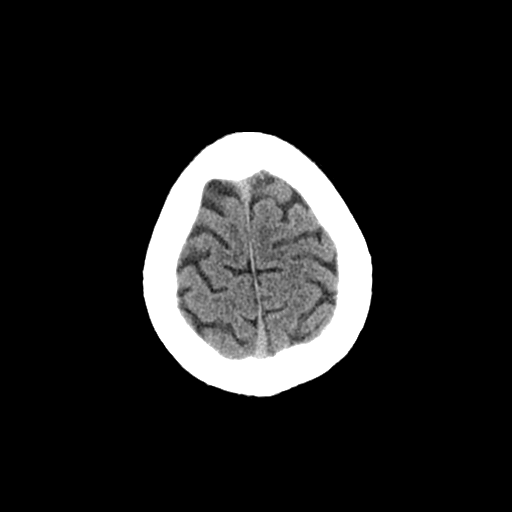
[im 27/31  brain]
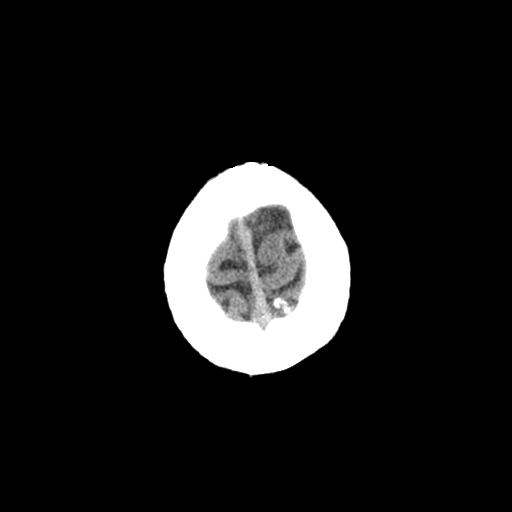
[im 29/31  brain]
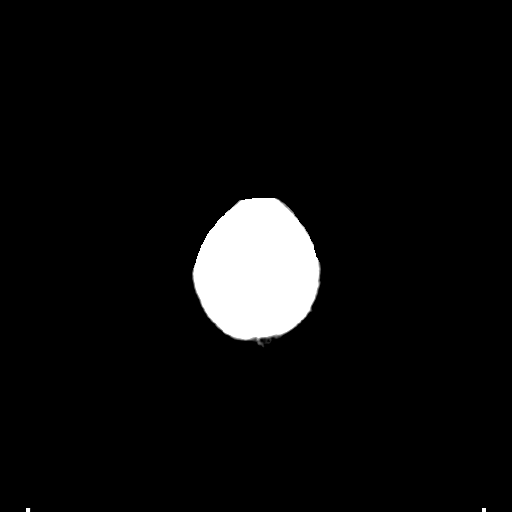

[16 of 30 positions shown; findings below may reference images not displayed]

FINDINGS: Brain: No evidence of acute infarction, hemorrhage, hydrocephalus,
extra-axial collection or mass lesion/mass effect.

Vascular: No hyperdense vessel or unexpected calcification.

Skull: Normal. Negative for fracture or focal lesion.

Sinuses/Orbits: Mild ethmoid air cell mucosal thickening. Otherwise,
the sinuses are clear. No acute orbital abnormality.

Other: None.
IMPRESSION: No evidence of acute intracranial abnormality.

## 2022-04-15 ENCOUNTER — Telehealth: Payer: Self-pay

## 2022-04-15 NOTE — Telephone Encounter (Signed)
Copied from [place] [id number]. Topic: General - Inquiry >> Apr 15, 2022 12:52 PM Devoria Glassing wrote: Reason for CRM: pt wants to know if Dr Ancil Boozer has written the letter to the [facility] for him yet? Please call and let him know if he can pick up   Per Miel on 03/30/22:  Letter has been written and signed by Dr. Ancil Boozer.  Voicemail was left for patient to come by and pick up.  Copy is also in chart under letters tab.      Called today and lvm letting him know it was ready on 03/30/22 and ready to be picked up.

## 2022-05-13 NOTE — Patient Instructions (Signed)
Preventive Care 51 Years Old, Male ?Preventive care refers to lifestyle choices and visits with your health care provider that can promote health and wellness. Preventive care visits are also called wellness exams. ?What can I expect for my preventive care visit? ?Counseling ?During your preventive care visit, your health care provider may ask about your: ?Medical history, including: ?Past medical problems. ?Family medical history. ?Current health, including: ?Emotional well-being. ?Home life and relationship well-being. ?Sexual activity. ?Lifestyle, including: ?Alcohol, nicotine or tobacco, and drug use. ?Access to firearms. ?Diet, exercise, and sleep habits. ?Safety issues such as seatbelt and bike helmet use. ?Sunscreen use. ?Work and work environment. ?Physical exam ?Your health care provider will check your: ?Height and weight. These may be used to calculate your BMI (body mass index). BMI is a measurement that tells if you are at a healthy weight. ?Waist circumference. This measures the distance around your waistline. This measurement also tells if you are at a healthy weight and may help predict your risk of certain diseases, such as type 2 diabetes and high blood pressure. ?Heart rate and blood pressure. ?Body temperature. ?Skin for abnormal spots. ?What immunizations do I need? ? ?Vaccines are usually given at various ages, according to a schedule. Your health care provider will recommend vaccines for you based on your age, medical history, and lifestyle or other factors, such as travel or where you work. ?What tests do I need? ?Screening ?Your health care provider may recommend screening tests for certain conditions. This may include: ?Lipid and cholesterol levels. ?Diabetes screening. This is done by checking your blood sugar (glucose) after you have not eaten for a while (fasting). ?Hepatitis B test. ?Hepatitis C test. ?HIV (human immunodeficiency virus) test. ?STI (sexually transmitted infection)  testing, if you are at risk. ?Lung cancer screening. ?Prostate cancer screening. ?Colorectal cancer screening. ?Talk with your health care provider about your test results, treatment options, and if necessary, the need for more tests. ?Follow these instructions at home: ?Eating and drinking ? ?Eat a diet that includes fresh fruits and vegetables, whole grains, lean protein, and low-fat dairy products. ?Take vitamin and mineral supplements as recommended by your health care provider. ?Do not drink alcohol if your health care provider tells you not to drink. ?If you drink alcohol: ?Limit how much you have to 0-50 drinks a day. ?Know how much alcohol is in your drink. In the [place] one drink equals one 12 oz bottle of beer (355 mL), one 5 oz glass of wine (148 mL), or one 1? oz glass of hard liquor (44 mL). ?Lifestyle ?Brush your teeth every morning and night with fluoride toothpaste. Floss one time each day. ?Exercise for at least 30 minutes 5 or more days each week. ?Do not use any products that contain nicotine or tobacco. These products include cigarettes, chewing tobacco, and vaping devices, such as e-cigarettes. If you need help quitting, ask your health care provider. ?Do not use drugs. ?If you are sexually active, practice safe sex. Use a condom or other form of protection to prevent STIs. ?Take aspirin only as told by your health care provider. Make sure that you understand how much to take and what form to take. Work with your health care provider to find out whether it is safe and beneficial for you to take aspirin daily. ?Find healthy ways to manage stress, such as: ?Meditation, yoga, or listening to music. ?Journaling. ?Talking to a trusted person. ?Spending time with friends and family. ?Minimize exposure to UV radiation to reduce   your risk of skin cancer. ?Safety ?Always wear your seat belt while driving or riding in a vehicle. ?Do not drive: ?If you have been drinking alcohol. Do not ride with someone who  has been drinking. ?When you are tired or distracted. ?While texting. ?If you have been using any mind-altering substances or drugs. ?Wear a helmet and other protective equipment during sports activities. ?If you have firearms in your house, make sure you follow all gun safety procedures. ?What's next? ?Go to your health care provider once a year for an annual wellness visit. ?Ask your health care provider how often you should have your eyes and teeth checked. ?Stay up to date on all vaccines. ?This information is not intended to replace advice given to you by your health care provider. Make sure you discuss any questions you have with your health care provider. ?Document Revised: 11/20/2020 Document Reviewed: 11/20/2020 ?Elsevier Patient Education ? 2023 [employer] ? ?

## 2022-05-13 NOTE — Progress Notes (Unsigned)
Name: Gilbert Buchanan   MRN: [id number]    DOB: May 14, 1971   Date:05/14/2022       Progress Note  Subjective  Chief Complaint  Annual Exam  HPI  Patient presents for annual CPE.  IPSS Questionnaire (AUA-7): Over the past month.   1)  How often have you had a sensation of not emptying your bladder completely after you finish urinating?  5 - Almost always  2)  How often have you had to urinate again less than two hours after you finished urinating? 0 - Not at all  3)  How often have you found you stopped and started again several times when you urinated?  0 - Not at all  4) How difficult have you found it to postpone urination?  0 - Not at all  5) How often have you had a weak urinary stream?  0 - Not at all  6) How often have you had to push or strain to begin urination?  1 - Less than 1 time in 5  7) How many times did you most typically get up to urinate from the time you went to bed until the time you got up in the morning?  0 - None  Total score:  0-7 mildly symptomatic   8-19 moderately symptomatic   20-35 severely symptomatic     Diet: yogurt for breakfast, all vegetables at lunch, dinner is anything, healthy snacks Exercise: push ups and sit ups daily and is busy at work  Last Dental Exam: up to date  Last Eye Exam: once a year   Depression: phq 9 is negative    05/14/2022    7:53 AM 05/13/2021    8:02 AM 05/08/2020   11:00 AM 02/29/2020    8:11 AM 02/14/2020    2:14 PM  Depression screen PHQ 2/9  Decreased Interest 0 0 0 0 0  Down, Depressed, Hopeless 0 0 0 0 0  PHQ - 2 Score 0 0 0 0 0  Altered sleeping 0 0 1    Tired, decreased energy 0 0 0    Change in appetite 0 0 0    Feeling bad or failure about yourself  0 0 0    Trouble concentrating 0 0 0    Moving slowly or fidgety/restless 0 0 0    Suicidal thoughts 0 0 0    PHQ-9 Score 0 0 1      Hypertension:  BP Readings from Last 3 Encounters:  05/14/22 120/72  07/02/21 113/86  05/13/21 122/82    Obesity: Wt  Readings from Last 3 Encounters:  05/14/22 200 lb (90.7 kg)  07/02/21 210 lb (95.3 kg)  05/13/21 211 lb (95.7 kg)   BMI Readings from Last 3 Encounters:  05/14/22 26.39 kg/m  07/02/21 26.96 kg/m  05/13/21 27.09 kg/m     Lipids:  Lab Results  Component Value Date   CHOL 204 (H) 05/13/2021   CHOL 230 (H) 11/10/2019   CHOL 222 (H) 11/04/2018   Lab Results  Component Value Date   HDL 42 05/13/2021   HDL 46 11/10/2019   HDL 47 11/04/2018   Lab Results  Component Value Date   LDLCALC 137 (H) 05/13/2021   LDLCALC 156 (H) 11/10/2019   LDLCALC 150 (H) 11/04/2018   Lab Results  Component Value Date   TRIG 126 05/13/2021   TRIG 150 (H) 11/10/2019   TRIG 128 11/04/2018   Lab Results  Component Value Date   CHOLHDL 4.9  05/13/2021   CHOLHDL 5.0 (H) 11/10/2019   CHOLHDL 4.7 11/04/2018   No results found for: "LDLDIRECT" Glucose:  Glucose  Date Value Ref Range Status  01/24/2013 137 (H) 65 - 99 mg/dL Final   Glucose, Bld  Date Value Ref Range Status  05/13/2021 98 65 - 99 mg/dL Final    Comment:    .            Fasting reference interval .   11/10/2019 104 (H) 65 - 99 mg/dL Final    Comment:    .            Fasting reference interval . For someone without known diabetes, a glucose value between 100 and 125 mg/dL is consistent with prediabetes and should be confirmed with a follow-up test. .   11/04/2018 102 (H) 65 - 99 mg/dL Final    Comment:    .            Fasting reference interval . For someone without known diabetes, a glucose value between 100 and 125 mg/dL is consistent with prediabetes and should be confirmed with a follow-up test. .     [place] Office Visit from 05/13/2021 in [facility]  AUDIT-C Score 5      Married STD testing and prevention (HIV/chl/gon/syphilis): 10/19/13 Sexual history: one partner- married - wife is post-menopausal  Hep C Screening: 11/04/18 Skin cancer: Discussed monitoring for atypical  lesions Colorectal cancer: 07/02/21 Prostate cancer:   Lab Results  Component Value Date   PSA 1.47 05/13/2021   PSA 1.0 11/10/2019   PSA 0.8 04/09/2017     Lung cancer:  Low Dose CT Chest recommended if Age 22-80 years, 30 pack-year currently smoking OR have quit w/in 15years. Patient  no a candidate for screening   AAA: The USPSTF recommends one-time screening with ultrasonography in men ages 28 to 15 years who have ever smoked. Patient   no, a candidate for screening  ECG:  01/24/13  Vaccines:   Tdap: up to date Shingrix: discussed getting it after turning 51 yo Pneumonia: N/A Flu: today  COVID-19: discussed booster   Advanced Care Planning: A voluntary discussion about advance care planning including the explanation and discussion of advance directives.  Discussed health care proxy and Living will, and the patient was able to identify a health care proxy as wife.  Patient does not have a living will and power of attorney of health care   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   Baker's cyst of knee, right 12/25/2016   Dyslipidemia 11/11/2015   Hyperglycemia 11/11/2015   Chronic constipation 11/11/2015   GAD (generalized anxiety disorder) 11/11/2015   Prostatitis 05/05/2015   BPH with obstruction/lower urinary tract symptoms 05/05/2015    Past Surgical History:  Procedure Laterality Date   COLONOSCOPY WITH PROPOFOL N/A 07/02/2021   Procedure: COLONOSCOPY WITH PROPOFOL;  Surgeon: Jonathon Bellows, MD;  [facility];  Service: Gastroenterology;  Laterality: N/A;   HERNIA REPAIR     VARICOCELE EXCISION      Family History  Problem Relation Age of Onset   Kidney disease Mother    Testicular cancer Father    Polycystic kidney disease Other    Heart disease Maternal Grandmother    Stroke Paternal Grandmother    Stroke Paternal Grandfather    Prostate cancer Neg Hx      Social History  Socioeconomic History   Marital status: Married    Spouse name: Angelique   Number of children: 2   Years of education: Not on file   Highest education level: Bachelor's degree (e.g., BA, AB, BS)  Occupational History   Occupation: Engineer, building services   Tobacco Use   Smoking status: Former    Packs/day: 1.00    Years: 25.00    Total pack years: 25.00    Types: Cigarettes, Cigars    Start date: 06/09/1987    Quit date: 06/08/2012    Years since quitting: 9.9   Smokeless tobacco: Former    Types: Snuff    Quit date: 05/07/2017   Tobacco comments:    N/A  Vaping Use   Vaping Use: Former   Start date: 06/28/2014   Quit date: 06/08/2016  Substance and Sexual Activity   Alcohol use: Yes    Alcohol/week: 0.0 standard drinks of alcohol   Drug use: No   Sexual activity: Yes    Partners: Female  Other Topics Concern   Not on file  Social History Narrative   Married, two children at home    He work in [place] now , Engineer, building services    Social Determinants of Health   Financial Resource Strain: [place]  (05/14/2022)   Overall Financial Resource Strain (CARDIA)    Difficulty of Paying Living Expenses: Not hard at all  Food Insecurity: No Food Insecurity (05/14/2022)   Hunger Vital Sign    Worried About Running Out of Food in the Last Year: Never true    [employer] in the Last Year: Never true  Transportation Needs: No Transportation Needs (05/14/2022)   PRAPARE - Hydrologist (Medical): No    Lack of Transportation (Non-Medical): No  Physical Activity: Insufficiently Active (05/14/2022)   Exercise Vital Sign    Days of Exercise per Week: 3 days    Minutes of Exercise per Session: 40 min  Stress: Stress Concern Present (05/14/2022)   [place]    Feeling of Stress : To some extent  Social Connections: Moderately Integrated (05/14/2022)   Social Connection and  Isolation Panel [NHANES]    Frequency of Communication with Friends and Family: More than three times a week    Frequency of Social Gatherings with Friends and Family: Once a week    Attends Religious Services: More than 4 times per year    Active Member of [employer] or Organizations: No    Attends Archivist Meetings: Never    Marital Status: Married  Human resources officer Violence: Not At Risk (05/14/2022)   Humiliation, Afraid, Rape, and Kick questionnaire    Fear of Current or Ex-Partner: No    Emotionally Abused: No    Physically Abused: No    Sexually Abused: No     Current Outpatient Medications:    fluticasone (FLONASE) 50 MCG/ACT nasal spray, SPRAY 2 SPRAYS INTO EACH NOSTRIL EVERY DAY, Disp: 48 mL, Rfl: 1   Multiple Vitamin (MULTIVITAMIN) tablet, Take 1 tablet by mouth daily., Disp: , Rfl:    tamsulosin (FLOMAX) 0.4 MG CAPS capsule, Take 1 capsule (0.4 mg total) by mouth daily., Disp: 90 capsule, Rfl: 3   traZODone (DESYREL) 50 MG tablet, Take 1 tablet (50 mg total) by mouth at bedtime., Disp: 90 tablet, Rfl: 3  Allergies  Allergen Reactions   Acyclovir And Related      ROS  Constitutional: Negative for fever ,  positive for weight change.  Respiratory: Negative for cough and shortness of breath.   Cardiovascular: Negative for chest pain or palpitations.  Gastrointestinal: Negative for abdominal pain, no bowel changes.  Musculoskeletal: Negative for gait problem or joint swelling.  Skin: Negative for rash.  Neurological: Negative for dizziness or headache.  No other specific complaints in a complete review of systems (except as listed in HPI above).    Objective  Vitals:   05/14/22 0753  BP: 120/72  Pulse: 73  Resp: 16  SpO2: 98%  Weight: 200 lb (90.7 kg)  Height: '6\' 1"'$  (1.854 m)    Body mass index is 26.39 kg/m.  Physical Exam  Constitutional: Patient appears well-developed and well-nourished. No distress.  HENT: Head: Normocephalic and atraumatic.  Ears: B TMs ok, no erythema or effusion; Nose: Nose normal. Mouth/Throat: Oropharynx is clear and moist. No oropharyngeal exudate.  Eyes: Conjunctivae and EOM are normal. Pupils are equal, round, and reactive to light. No scleral icterus.  Neck: Normal range of motion. Neck supple. No JVD present. No thyromegaly present.  Cardiovascular: Normal rate, regular rhythm and normal heart sounds.  No murmur heard. No BLE edema. Pulmonary/Chest: Effort normal and breath sounds normal. No respiratory distress. Abdominal: Soft. Bowel sounds are normal, no distension. There is no tenderness. no masses MALE GENITALIA: Normal descended testes bilaterally, no masses palpated, no hernias, no lesions, no discharge RECTAL:enlarged prostate Musculoskeletal: Normal range of motion, no joint effusions. No gross deformities Neurological: he is alert and oriented to person, place, and time. No cranial nerve deficit. Coordination, balance, strength, speech and gait are normal.  Skin: Skin is warm and dry. No rash noted. No erythema.  Psychiatric: Patient has a normal mood and affect. behavior is normal. Judgment and thought content normal.   Fall Risk:    05/14/2022    7:53 AM 05/13/2021    8:02 AM 05/08/2020   10:59 AM 02/29/2020    8:11 AM 02/14/2020    2:13 PM  Fall Risk   Falls in the past year? 0 0 0 1 0  Number falls in past yr: 0 0 0 0 0  Injury with Fall? 0 0 0 1 0  Risk for fall due to : No Fall Risks No Fall Risks     Follow up Falls prevention discussed Falls prevention discussed   Falls evaluation completed     Functional Status Survey: Is the patient deaf or have difficulty hearing?: No Does the patient have difficulty seeing, even when wearing glasses/contacts?: No Does the patient have difficulty concentrating, remembering, or making decisions?: No Does the patient have difficulty walking or climbing stairs?: No Does the patient have difficulty dressing or bathing?: No Does the patient have  difficulty doing errands alone such as visiting a doctor's office or shopping?: No    Assessment & Plan  1. Well adult exam   2. Need for immunization against influenza  - Flu Vaccine QUAD 6+ mos PF IM (Fluarix Quad PF)  3. Dyslipidemia  - Lipid panel  4. BPH with obstruction/lower urinary tract symptoms  - tamsulosin (FLOMAX) 0.4 MG CAPS capsule; Take 1 capsule (0.4 mg total) by mouth daily.  Dispense: 90 capsule; Refill: 3 - PSA  5. Hyperglycemia   6. Long-term use of high-risk medication  - COMPLETE METABOLIC PANEL WITH GFR - CBC with Differential/Platelet  7. Family history of hemochromatosis  - Ferritin    -Prostate cancer screening and PSA options (with potential risks and benefits of testing vs not  testing) were discussed along with recent recs/guidelines. -USPSTF grade A and B recommendations reviewed with patient; age-appropriate recommendations, preventive care, screening tests, etc discussed and encouraged; healthy living encouraged; see AVS for patient education given to patient -Discussed importance of 150 minutes of physical activity weekly, eat two servings of fish weekly, eat one serving of tree nuts ( cashews, pistachios, pecans, almonds.Marland Kitchen) every other day, eat 6 servings of fruit/vegetables daily and drink plenty of water and avoid sweet beverages.  -Reviewed Health Maintenance: yes

## 2022-05-14 ENCOUNTER — Ambulatory Visit (INDEPENDENT_AMBULATORY_CARE_PROVIDER_SITE_OTHER): Payer: BC Managed Care – PPO | Admitting: Family Medicine

## 2022-05-14 ENCOUNTER — Encounter: Payer: Self-pay | Admitting: Family Medicine

## 2022-05-14 VITALS — BP 120/72 | HR 73 | Resp 16 | Ht 73.0 in | Wt 200.0 lb

## 2022-05-14 DIAGNOSIS — R739 Hyperglycemia, unspecified: Secondary | ICD-10-CM

## 2022-05-14 DIAGNOSIS — Z8349 Family history of other endocrine, nutritional and metabolic diseases: Secondary | ICD-10-CM | POA: Diagnosis not present

## 2022-05-14 DIAGNOSIS — Z0000 Encounter for general adult medical examination without abnormal findings: Secondary | ICD-10-CM

## 2022-05-14 DIAGNOSIS — N138 Other obstructive and reflux uropathy: Secondary | ICD-10-CM

## 2022-05-14 DIAGNOSIS — E785 Hyperlipidemia, unspecified: Secondary | ICD-10-CM

## 2022-05-14 DIAGNOSIS — N401 Enlarged prostate with lower urinary tract symptoms: Secondary | ICD-10-CM | POA: Diagnosis not present

## 2022-05-14 DIAGNOSIS — Z79899 Other long term (current) drug therapy: Secondary | ICD-10-CM

## 2022-05-14 DIAGNOSIS — Z23 Encounter for immunization: Secondary | ICD-10-CM | POA: Diagnosis not present

## 2022-05-14 MED ORDER — TAMSULOSIN HCL 0.4 MG PO CAPS: 0.4000 mg | ORAL_CAPSULE | Freq: Every day | ORAL | 3 refills | Status: DC

## 2022-05-15 LAB — COMPLETE METABOLIC PANEL WITH GFR
AG Ratio: 1.7 (calc) (ref 1.0–2.5)
ALT: 17 U/L (ref 9–46)
AST: 18 U/L (ref 10–40)
Albumin: 4.2 g/dL (ref 3.6–5.1)
Alkaline phosphatase (APISO): 42 U/L (ref 36–130)
BUN: 16 mg/dL (ref 7–25)
CO2: 29 mmol/L (ref 20–32)
Calcium: 9.5 mg/dL (ref 8.6–10.3)
Chloride: 103 mmol/L (ref 98–110)
Creat: 1.09 mg/dL (ref 0.60–1.29)
Globulin: 2.5 g/dL (calc) (ref 1.9–3.7)
Glucose, Bld: 109 mg/dL — ABNORMAL HIGH (ref 65–99)
Potassium: 4.4 mmol/L (ref 3.5–5.3)
Sodium: 140 mmol/L (ref 135–146)
Total Bilirubin: 0.7 mg/dL (ref 0.2–1.2)
Total Protein: 6.7 g/dL (ref 6.1–8.1)
eGFR: 83 mL/min/{1.73_m2} (ref >=60)

## 2022-05-15 LAB — CBC WITH DIFFERENTIAL/PLATELET
Absolute Monocytes: 285 cells/uL (ref 200–950)
Basophils Absolute: 11 cells/uL (ref 0–200)
Basophils Relative: 0.3 %
Eosinophils Absolute: 41 cells/uL (ref 15–500)
Eosinophils Relative: 1.1 %
HCT: 42.7 % (ref 38.5–50.0)
Hemoglobin: 15.2 g/dL (ref 13.2–17.1)
Lymphs Abs: 1217 cells/uL (ref 850–3900)
MCH: 33 pg (ref 27.0–33.0)
MCHC: 35.6 g/dL (ref 32.0–36.0)
MCV: 92.8 fL (ref 80.0–100.0)
MPV: 10.5 fL (ref 7.5–12.5)
Monocytes Relative: 7.7 %
Neutro Abs: 2146 cells/uL (ref 1500–7800)
Neutrophils Relative %: 58 %
Platelets: 192 10*3/uL (ref 140–400)
RBC: 4.6 10*6/uL (ref 4.20–5.80)
RDW: 11.9 % (ref 11.0–15.0)
Total Lymphocyte: 32.9 %
WBC: 3.7 10*3/uL — ABNORMAL LOW (ref 3.8–10.8)

## 2022-05-15 LAB — LIPID PANEL
Cholesterol: 228 mg/dL — ABNORMAL HIGH (ref <200)
HDL: 40 mg/dL (ref >=40)
LDL Cholesterol (Calc): 165 mg/dL (calc) — ABNORMAL HIGH
Non-HDL Cholesterol (Calc): 188 mg/dL (calc) — ABNORMAL HIGH (ref <130)
Total CHOL/HDL Ratio: 5.7 (calc) — ABNORMAL HIGH (ref <5.0)
Triglycerides: 117 mg/dL (ref <150)

## 2022-05-15 LAB — PSA: PSA: 1.01 ng/mL (ref <=4.00)

## 2022-05-15 LAB — FERRITIN: Ferritin: 148 ng/mL (ref 38–380)

## 2022-05-25 ENCOUNTER — Other Ambulatory Visit: Payer: Self-pay | Admitting: Family Medicine

## 2022-05-25 DIAGNOSIS — N401 Enlarged prostate with lower urinary tract symptoms: Secondary | ICD-10-CM

## 2022-05-26 NOTE — Progress Notes (Unsigned)
Name: Gilbert Buchanan   MRN: [id number]    DOB: 04/07/1971   Date:05/27/2022       Progress Note  Subjective  Chief Complaint  Medication Refill  HPI  GAD: he also has OCD, and history of ADD as a child ( but could not tolerate medication) he has tried multiple medications in the past - but states he could not tolerate the side effects of medications, he is off alprazolam, he also stopped Buspar and Wellbutrin, only taking Trazodone during the weekdays to sleep  Hyperlipidemia: he was on Vascepa but insurance did not pay for it, he is currently on diet, LDL went up to 165 but he does not want to statin therapy.   The 10-year ASCVD risk score (Arnett DK, et al., 2019) is: 4.8%   Values used to calculate the score:     Age: 51 years     Sex: Male     Is Non-Hispanic African American: No     Diabetic: No     Tobacco smoker: No     Systolic Blood Pressure: 389 mmHg     Is BP treated: No     HDL Cholesterol: 40 mg/dL     Total Cholesterol: 228 mg/dL    Overweight:  he was obese since childhood. He lost a lot of weight since fall 2016, doing well, his weight has been stable now, he was  stable between 197 lbs and 203 lbs , today it was 207 lbs but he did not take his jacket off.   Intermittent low back pain: he had severe symptoms of back pain with right lower leg radiculitis and right foot drop in 2017, he did not have MRI because of cost and went to chiropractor, he is doing well now, he no longer needs muscle relaxer. Weight loss and stretching is controlling symptoms   BPH: seen by Dr. Diamantina Providence and he is doing well on Flomax. Last year he was released from his care to get monitored here yearly.PSA normal   Low white count: he is not interested in rechecking labs at this time.   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   Baker's cyst of knee, right 12/25/2016   Dyslipidemia 11/11/2015    Hyperglycemia 11/11/2015   Chronic constipation 11/11/2015   GAD (generalized anxiety disorder) 11/11/2015   Prostatitis 05/05/2015   BPH with obstruction/lower urinary tract symptoms 05/05/2015    Past Surgical History:  Procedure Laterality Date   COLONOSCOPY WITH PROPOFOL N/A 07/02/2021   Procedure: COLONOSCOPY WITH PROPOFOL;  Surgeon: Jonathon Bellows, MD;  Location: [facility] ENDOSCOPY;  Service: Gastroenterology;  Laterality: N/A;   HERNIA REPAIR     VARICOCELE EXCISION      Family History  Problem Relation Age of Onset   Kidney disease Mother    Testicular cancer Father    Polycystic kidney disease Other    Heart disease Maternal Grandmother    Stroke Paternal Grandmother    Stroke Paternal Grandfather    Prostate cancer Neg Hx     Social History   Tobacco Use   Smoking status: Former    Packs/day: 1.00    Years: 25.00    Total pack years: 25.00    Types: Cigarettes, Cigars    Start date: 06/09/1987    Quit date: 06/08/2012    Years since quitting: 9.9   Smokeless tobacco: Former    Types: Snuff  Quit date: 05/07/2017   Tobacco comments:    N/A  Substance Use Topics   Alcohol use: Yes    Alcohol/week: 0.0 standard drinks of alcohol     Current Outpatient Medications:    fluticasone (FLONASE) 50 MCG/ACT nasal spray, SPRAY 2 SPRAYS INTO EACH NOSTRIL EVERY DAY, Disp: 48 mL, Rfl: 1   Multiple Vitamin (MULTIVITAMIN) tablet, Take 1 tablet by mouth daily., Disp: , Rfl:    tamsulosin (FLOMAX) 0.4 MG CAPS capsule, Take 1 capsule (0.4 mg total) by mouth daily., Disp: 90 capsule, Rfl: 3   traZODone (DESYREL) 50 MG tablet, Take 1 tablet (50 mg total) by mouth at bedtime., Disp: 90 tablet, Rfl: 3  Allergies  Allergen Reactions   Acyclovir And Related     I personally reviewed active problem list, medication list, allergies, family history, social history, health maintenance with the patient/caregiver today.   ROS  Ten systems reviewed and is negative except as mentioned  in HPI   Objective  Vitals:   05/27/22 0758  BP: 122/72  Pulse: 89  Resp: 16  SpO2: 98%  Weight: 207 lb (93.9 kg)  Height: _0  (1.854 m)    Body mass index is 27.31 kg/m.  Physical Exam  Constitutional: Patient appears well-developed and well-nourished.  No distress.  HEENT: head atraumatic, normocephalic, pupils equal and reactive to light, neck supple Cardiovascular: Normal rate, regular rhythm and normal heart sounds.  No murmur heard. No BLE edema. Pulmonary/Chest: Effort normal and breath sounds normal. No respiratory distress. Abdominal: Soft.  There is no tenderness. Psychiatric: Patient has a normal mood and affect. behavior is normal. Judgment and thought content normal.   Recent Results (from the past 2160 hour(s))  Lipid panel     Status: Abnormal   Collection Time: 05/14/22  8:41 AM  Result Value Ref Range   Cholesterol 228 (H) <200 mg/dL   HDL 40 > OR = 40 mg/dL   Triglycerides 117 <150 mg/dL   LDL Cholesterol (Calc) 165 (H) mg/dL (calc)    Comment: Reference range: <100 . Desirable range <100 mg/dL for primary prevention;   <70 mg/dL for patients with CHD or diabetic patients  with > or = 2 CHD risk factors. Marland Kitchen LDL-C is now calculated using the Martin-Hopkins  calculation, which is a validated novel method providing  better accuracy than the Friedewald equation in the  estimation of LDL-C.  Cresenciano Genre et al. Annamaria Helling. 9509;326(71): 2061-2068  (http://education.[facility].com/faq/FAQ164)    Total CHOL/HDL Ratio 5.7 (H) <5.0 (calc)   Non-HDL Cholesterol (Calc) 188 (H) <130 mg/dL (calc)    Comment: For patients with diabetes plus 1 major ASCVD risk  factor, treating to a non-HDL-C goal of <100 mg/dL  (LDL-C of <70 mg/dL) is considered a therapeutic  option.   COMPLETE METABOLIC PANEL WITH GFR     Status: Abnormal   Collection Time: 05/14/22  8:41 AM  Result Value Ref Range   Glucose, Bld 109 (H) 65 - 99 mg/dL    Comment: .            Fasting  reference interval . For someone without known diabetes, a glucose value between 100 and 125 mg/dL is consistent with prediabetes and should be confirmed with a follow-up test. .    BUN 16 7 - 25 mg/dL   Creat 1.09 0.60 - 1.29 mg/dL   eGFR 83 > OR = 60 mL/min/1.36m   BUN/Creatinine Ratio SEE NOTE: 6 - 22 (calc)    Comment:  Not Reported: BUN and Creatinine are within    reference range. .    Sodium 140 135 - 146 mmol/L   Potassium 4.4 3.5 - 5.3 mmol/L   Chloride 103 98 - 110 mmol/L   CO2 29 20 - 32 mmol/L   Calcium 9.5 8.6 - 10.3 mg/dL   Total Protein 6.7 6.1 - 8.1 g/dL   Albumin 4.2 3.6 - 5.1 g/dL   Globulin 2.5 1.9 - 3.7 g/dL (calc)   AG Ratio 1.7 1.0 - 2.5 (calc)   Total Bilirubin 0.7 0.2 - 1.2 mg/dL   Alkaline phosphatase (APISO) 42 36 - 130 U/L   AST 18 10 - 40 U/L   ALT 17 9 - 46 U/L  CBC with Differential/Platelet     Status: Abnormal   Collection Time: 05/14/22  8:41 AM  Result Value Ref Range   WBC 3.7 (L) 3.8 - 10.8 Thousand/uL   RBC 4.60 4.20 - 5.80 Million/uL   Hemoglobin 15.2 13.2 - 17.1 g/dL   HCT 42.7 38.5 - 50.0 %   MCV 92.8 80.0 - 100.0 fL   MCH 33.0 27.0 - 33.0 pg   MCHC 35.6 32.0 - 36.0 g/dL   RDW 11.9 11.0 - 15.0 %   Platelets 192 140 - 400 Thousand/uL   MPV 10.5 7.5 - 12.5 fL   Neutro Abs 2,146 1,500 - 7,800 cells/uL   Lymphs Abs 1,217 850 - 3,900 cells/uL   Absolute Monocytes 285 200 - 950 cells/uL   Eosinophils Absolute 41 15 - 500 cells/uL   Basophils Absolute 11 0 - 200 cells/uL   Neutrophils Relative % 58 %   Total Lymphocyte 32.9 %   Monocytes Relative 7.7 %   Eosinophils Relative 1.1 %   Basophils Relative 0.3 %  Ferritin     Status: None   Collection Time: 05/14/22  8:41 AM  Result Value Ref Range   Ferritin 148 38 - 380 ng/mL  PSA     Status: None   Collection Time: 05/14/22  8:41 AM  Result Value Ref Range   PSA 1.01 < OR = 4.00 ng/mL    Comment: The total PSA value from this assay system is  standardized against the WHO  standard. The test  result will be approximately 20% lower when compared  to the equimolar-standardized total PSA (Beckman  Coulter). Comparison of serial PSA results should be  interpreted with this fact in mind. . This test was performed using the Siemens  chemiluminescent method. Values obtained from  different assay methods cannot be used interchangeably. PSA levels, regardless of value, should not be interpreted as absolute evidence of the presence or absence of disease.     PHQ2/9:    05/27/2022    7:59 AM 05/14/2022    7:53 AM 05/13/2021    8:02 AM 05/08/2020   11:00 AM 02/29/2020    8:11 AM  Depression screen PHQ 2/9  Decreased Interest 0 0 0 0 0  Down, Depressed, Hopeless 0 0 0 0 0  PHQ - 2 Score 0 0 0 0 0  Altered sleeping 0 0 0 1   Tired, decreased energy 0 0 0 0   Change in appetite 0 0 0 0   Feeling bad or failure about yourself  0 0 0 0   Trouble concentrating 0 0 0 0   Moving slowly or fidgety/restless 0 0 0 0   Suicidal thoughts 0 0 0 0   PHQ-9 Score 0 0 0 1  phq 9 is negative   Fall Risk:    05/27/2022    7:59 AM 05/14/2022    7:53 AM 05/13/2021    8:02 AM 05/08/2020   10:59 AM 02/29/2020    8:11 AM  Fall Risk   Falls in the past year? 0 0 0 0 1  Number falls in past yr: 0 0 0 0 0  Injury with Fall? 0 0 0 0 1  Risk for fall due to : No Fall Risks No Fall Risks No Fall Risks    Follow up Falls prevention discussed Falls prevention discussed Falls prevention discussed        Functional Status Survey: Is the patient deaf or have difficulty hearing?: No Does the patient have difficulty seeing, even when wearing glasses/contacts?: No Does the patient have difficulty concentrating, remembering, or making decisions?: No Does the patient have difficulty walking or climbing stairs?: No Does the patient have difficulty dressing or bathing?: No Does the patient have difficulty doing errands alone such as visiting a doctor's office or shopping?:  No    Assessment & Plan  1. Insomnia, unspecified type  - traZODone (DESYREL) 50 MG tablet; Take 1 tablet (50 mg total) by mouth at bedtime.  Dispense: 90 tablet; Refill: 3  2. BPH with obstruction/lower urinary tract symptoms   3. Dyslipidemia  He wants to follow diet only  4. Intermittent low back pain  Continue stretching    5. Other neutropenia ([place])   We will monitor

## 2022-05-27 ENCOUNTER — Encounter: Payer: Self-pay | Admitting: Family Medicine

## 2022-05-27 ENCOUNTER — Ambulatory Visit: Payer: BC Managed Care – PPO | Admitting: Family Medicine

## 2022-05-27 VITALS — BP 122/72 | HR 89 | Resp 16 | Ht 73.0 in | Wt 207.0 lb

## 2022-05-27 DIAGNOSIS — N138 Other obstructive and reflux uropathy: Secondary | ICD-10-CM

## 2022-05-27 DIAGNOSIS — G4700 Insomnia, unspecified: Secondary | ICD-10-CM

## 2022-05-27 DIAGNOSIS — M545 Low back pain, unspecified: Secondary | ICD-10-CM | POA: Diagnosis not present

## 2022-05-27 DIAGNOSIS — E785 Hyperlipidemia, unspecified: Secondary | ICD-10-CM

## 2022-05-27 DIAGNOSIS — N401 Enlarged prostate with lower urinary tract symptoms: Secondary | ICD-10-CM

## 2022-05-27 DIAGNOSIS — D708 Other neutropenia: Secondary | ICD-10-CM

## 2022-05-27 HISTORY — DX: Insomnia, unspecified: G47.00

## 2022-05-27 MED ORDER — TRAZODONE HCL 50 MG PO TABS: 50.0000 mg | ORAL_TABLET | Freq: Every day | ORAL | 3 refills | Status: DC

## 2022-07-28 ENCOUNTER — Other Ambulatory Visit: Payer: Self-pay | Admitting: Family Medicine

## 2022-07-28 DIAGNOSIS — G4700 Insomnia, unspecified: Secondary | ICD-10-CM

## 2022-09-23 ENCOUNTER — Encounter: Payer: Self-pay | Admitting: Nurse Practitioner

## 2022-09-23 ENCOUNTER — Ambulatory Visit: Payer: BC Managed Care – PPO | Admitting: Nurse Practitioner

## 2022-09-23 VITALS — BP 124/82 | HR 88 | Temp 98.0°F | Resp 18 | Ht 74.0 in | Wt 204.9 lb

## 2022-09-23 DIAGNOSIS — E7252 Trimethylaminuria: Secondary | ICD-10-CM

## 2022-09-23 NOTE — Progress Notes (Signed)
BP 124/82   Pulse 88   Temp 98 F (36.7 C)   Resp 18   Ht  (1.88 m)   Wt 204 lb 14.4 oz (92.9 kg)   SpO2 98%   BMI 26.31 kg/m    Subjective:    Patient ID: Gilbert Buchanan, male    DOB: June 07, 1971, 52 y.o.   MRN: [id number]  HPI: Gilbert Buchanan is a 52 y.o. male  Chief Complaint  Patient presents with   body odor   Fishy body odor: he says that he noticed it two months ago.  He says that he was taking fish oil, multivitamin, vitamin b. Vitmain d and zinc.  He says that he stopped taking the vitamins.  And had two weeks that he did not smell of fish.  He says that he started reintroducing them back in to his diet.  He says that it then the smell came back.  He stopped the vitamins again for a trip and it came back without being on the vitamins.  He says that he thinks it is trimethylaminuria.  Will get trimethylamine N-oxide test.  Patient is to not eat fish or take fish oil today, fast tonight and come for lab tomorrow.    Relevant past medical, surgical, family and social history reviewed and updated as indicated. Interim medical history since our last visit reviewed. Allergies and medications reviewed and updated.  Review of Systems  Constitutional: Negative for fever or weight change.  Respiratory: Negative for cough and shortness of breath.   Cardiovascular: Negative for chest pain or palpitations.  Gastrointestinal: Negative for abdominal pain, no bowel changes.  Musculoskeletal: Negative for gait problem or joint swelling.  Skin: Negative for rash. Positive for fish odor Neurological: Negative for dizziness or headache.  No other specific complaints in a complete review of systems (except as listed in HPI above).      Objective:    BP 124/82   Pulse 88   Temp 98 F (36.7 C)   Resp 18   Ht  (1.88 m)   Wt 204 lb 14.4 oz (92.9 kg)   SpO2 98%   BMI 26.31 kg/m   Wt Readings from Last 3 Encounters:  09/23/22 204 lb 14.4 oz (92.9 kg)  05/27/22 207 lb  (93.9 kg)  05/14/22 200 lb (90.7 kg)    Physical Exam  Constitutional: Patient appears well-developed and well-nourished.  No distress.  HEENT: head atraumatic, normocephalic, pupils equal and reactive to light, neck supple Cardiovascular: Normal rate, regular rhythm and normal heart sounds.  No murmur heard. No BLE edema. Pulmonary/Chest: Effort normal and breath sounds normal. No respiratory distress. Abdominal: Soft.  There is no tenderness. Psychiatric: Patient has a normal mood and affect. behavior is normal. Judgment and thought content normal.  Results for orders placed or performed in visit on 05/14/22  Lipid panel  Result Value Ref Range   Cholesterol 228 (H) <200 mg/dL   HDL 40 > OR = 40 mg/dL   Triglycerides 409 <811 mg/dL   LDL Cholesterol (Calc) 165 (H) mg/dL (calc)   Total CHOL/HDL Ratio 5.7 (H) <5.0 (calc)   Non-HDL Cholesterol (Calc) 188 (H) <130 mg/dL (calc)  COMPLETE METABOLIC PANEL WITH GFR  Result Value Ref Range   Glucose, Bld 109 (H) 65 - 99 mg/dL   BUN 16 7 - 25 mg/dL   Creat 9.14 7.82 - 9.56 mg/dL   eGFR 83 > OR = 60 OZ/HYQ/6.57Q4   BUN/Creatinine Ratio  SEE NOTE: 6 - 22 (calc)   Sodium 140 135 - 146 mmol/L   Potassium 4.4 3.5 - 5.3 mmol/L   Chloride 103 98 - 110 mmol/L   CO2 29 20 - 32 mmol/L   Calcium 9.5 8.6 - 10.3 mg/dL   Total Protein 6.7 6.1 - 8.1 g/dL   Albumin 4.2 3.6 - 5.1 g/dL   Globulin 2.5 1.9 - 3.7 g/dL (calc)   AG Ratio 1.7 1.0 - 2.5 (calc)   Total Bilirubin 0.7 0.2 - 1.2 mg/dL   Alkaline phosphatase (APISO) 42 36 - 130 U/L   AST 18 10 - 40 U/L   ALT 17 9 - 46 U/L  CBC with Differential/Platelet  Result Value Ref Range   WBC 3.7 (L) 3.8 - 10.8 Thousand/uL   RBC 4.60 4.20 - 5.80 Million/uL   Hemoglobin 15.2 13.2 - 17.1 g/dL   HCT 40.9 81.1 - 91.4 %   MCV 92.8 80.0 - 100.0 fL   MCH 33.0 27.0 - 33.0 pg   MCHC 35.6 32.0 - 36.0 g/dL   RDW 78.2 95.6 - 21.3 %   Platelets 192 140 - 400 Thousand/uL   MPV 10.5 7.5 - 12.5 fL   Neutro Abs  2,146 1,500 - 7,800 cells/uL   Lymphs Abs 1,217 850 - 3,900 cells/uL   Absolute Monocytes 285 200 - 950 cells/uL   Eosinophils Absolute 41 15 - 500 cells/uL   Basophils Absolute 11 0 - 200 cells/uL   Neutrophils Relative % 58 %   Total Lymphocyte 32.9 %   Monocytes Relative 7.7 %   Eosinophils Relative 1.1 %   Basophils Relative 0.3 %  Ferritin  Result Value Ref Range   Ferritin 148 38 - 380 ng/mL  PSA  Result Value Ref Range   PSA 1.01 < OR = 4.00 ng/mL      Assessment & Plan:   Problem List Items Addressed This Visit   None Visit Diagnoses     Fish odor syndrome    -  Primary   getting TMAO lab tomorrow after fasting, for now avoid fish and fish oil        Follow up plan: Return if symptoms worsen or fail to improve.

## 2022-09-24 ENCOUNTER — Other Ambulatory Visit: Payer: Self-pay | Admitting: Nurse Practitioner

## 2022-09-24 DIAGNOSIS — E7252 Trimethylaminuria: Secondary | ICD-10-CM | POA: Diagnosis not present

## 2022-10-01 LAB — TMAO (TRIMETHYLAMINE N-OXIDE): TMAO (TRIMETHYLAMINE N OXIDE): 12.2 uM — ABNORMAL HIGH (ref <6.2)

## 2022-10-12 NOTE — Progress Notes (Unsigned)
Name: Gilbert Buchanan   MRN: [id number]    DOB: 26-Jun-1970   Date:10/13/2022       Progress Note  Subjective  Chief Complaint  Discuss Lab Results  HPI  Elevated TMAO level: patient noticed fish odor on his body about 2 months ago, symptoms are intermittent. He stopped taking fish oil after that he stopped taking MVI, but still has intermittent fish odor on his body. Discussed increase risk of heart attacks and strokes . He is very concerned about the smell, specially at work. Discussed referral to geneticist but he wants to hold off. Keep a diary of your diet , avoid fish, cow's milk, beans, eggs peanuts, and supplements containing lecithin.   Explained the risk of heart attacks and strokes and the need to take statin therapy, he is willing to try pravastatin   The 10-year ASCVD risk score (Arnett DK, et al., 2019) is: 5%   Values used to calculate the score:     Age: 52 years     Sex: Male     Is Non-Hispanic African American: No     Diabetic: No     Tobacco smoker: No     Systolic Blood Pressure: 124 mmHg     Is BP treated: No     HDL Cholesterol: 40 mg/dL     Total Cholesterol: 228 mg/dL   GAD: he also has OCD, and history of ADD as a child ( but could not tolerate medication) he has tried multiple medications in the past - but states he could not tolerate the side effects of medications, he is off alprazolam, he also stopped Buspar and Wellbutrin, only taking Trazodone during the weekdays to sleep  He does not enjoy his job, he wants to change careers , his job is very stressful, he does not want medications for that yet. He states he wish he could relax , he goes home after work and needs to work on his yard for hours, he always wants to better himself , he even has to plan when he goes to [employer]. He states he wants to try something to see if it will help him relax  Patient Active Problem List   Diagnosis Date Noted   Other neutropenia (HCC) 05/27/2022   Intermittent low back  pain 05/27/2022   Insomnia 05/27/2022   COVID-19 01/31/2020   Noise-induced hearing loss of both ears 06/28/2017   Baker's cyst of knee, right 12/25/2016   Dyslipidemia 11/11/2015   Hyperglycemia 11/11/2015   Chronic constipation 11/11/2015   GAD (generalized anxiety disorder) 11/11/2015   Prostatitis 05/05/2015   BPH with obstruction/lower urinary tract symptoms 05/05/2015    Past Surgical History:  Procedure Laterality Date   COLONOSCOPY WITH PROPOFOL N/A 07/02/2021   Procedure: COLONOSCOPY WITH PROPOFOL;  Surgeon: Wyline Mood, MD;  Location: [facility] ENDOSCOPY;  Service: Gastroenterology;  Laterality: N/A;   HERNIA REPAIR     VARICOCELE EXCISION      Family History  Problem Relation Age of Onset   Kidney disease Mother    Testicular cancer Father    Polycystic kidney disease Other    Heart disease Maternal Grandmother    Stroke Paternal Grandmother    Stroke Paternal Grandfather    Prostate cancer Neg Hx     Social History   Tobacco Use   Smoking status: Former    Packs/day: 1.00    Years: 25.00    Additional pack years: 0.00    Total pack years: 25.00  Types: Cigarettes, Cigars    Start date: 06/09/1987    Quit date: 06/08/2012    Years since quitting: 10.3   Smokeless tobacco: Former    Types: Snuff    Quit date: 05/07/2017   Tobacco comments:    N/A  Substance Use Topics   Alcohol use: Yes    Alcohol/week: 0.0 standard drinks of alcohol     Current Outpatient Medications:    fluticasone (FLONASE) 50 MCG/ACT nasal spray, SPRAY 2 SPRAYS INTO EACH NOSTRIL EVERY DAY, Disp: 48 mL, Rfl: 1   metroNIDAZOLE (FLAGYL) 500 MG tablet, Take 1 tablet (500 mg total) by mouth 2 (two) times daily for 7 days., Disp: 14 tablet, Rfl: 0   Multiple Vitamin (MULTIVITAMIN) tablet, Take 1 tablet by mouth daily., Disp: , Rfl:    pravastatin (PRAVACHOL) 20 MG tablet, Take 1 tablet (20 mg total) by mouth daily., Disp: 90 tablet, Rfl: 0   tamsulosin (FLOMAX) 0.4 MG CAPS capsule, Take 1  capsule (0.4 mg total) by mouth daily., Disp: 90 capsule, Rfl: 3   traZODone (DESYREL) 50 MG tablet, Take 1 tablet (50 mg total) by mouth at bedtime., Disp: 90 tablet, Rfl: 3  Allergies  Allergen Reactions   Acyclovir And Related     I personally reviewed active problem list, medication list, allergies, family history, social history, health maintenance with the patient/caregiver today.   ROS  Ten systems reviewed and is negative except as mentioned in HPI  Objective  Vitals:   10/13/22 1428  BP: 124/80  Pulse: 72  Resp: 14  Temp: 97.7 F (36.5 C)  TempSrc: Oral  SpO2: 98%  Weight: 205 lb 4.8 oz (93.1 kg)  Height: 6\' 2"  (1.88 m)    Body mass index is 26.36 kg/m.  Physical Exam  Constitutional: Patient appears well-developed and well-nourished.  No distress.  HEENT: head atraumatic, normocephalic, pupils equal and reactive to light, neck supple Cardiovascular: Normal rate, regular rhythm and normal heart sounds.  No murmur heard. No BLE edema. Pulmonary/Chest: Effort normal and breath sounds normal. No respiratory distress. Abdominal: Soft.  There is no tenderness. Psychiatric: Patient has a normal mood and affect. behavior is normal. Judgment and thought content normal.   Recent Results (from the past 2160 hour(s))  TMAO (Trimethylamine N-Oxide)     Status: Abnormal   Collection Time: 09/24/22  8:06 AM  Result Value Ref Range   TMAO (TRIMETHYLAMINE N OXIDE) 12.2 (H) <6.2 uM    Comment: This test was developed and its analytical performance characteristics have been determined by [facility]. It has not been cleared or approved by the U.S. Food and Drug Administration. This assay has been validated pursuant to the CLIA regulations and is used for clinical purposes.  Based on a population (N=[id number]) defined as ambulatory stable patients without acute coronary syndrome who underwent elective diagnostic  coronary angiography (1) and a reference range study of apparently healthy donors (N=[id number]), we have defined the following cut-offs for TMAO to assess relative risk of a cardiovascular event: A cut-off of <6.2 uM defines a population at optimal relative risk for a cardiovascular event relative to those above this level. 6.2-9.9 uM defines a population at moderate relative risk for a cardiovascular event (two-fold increased risk of MACE at 3 years) relative to those with TMAO <6.2 uM (1). Given t he dose-dependent relationship between TMAO and cardiovascular event risk demonstrated across multiple clinical subgroups (2), those above the upper limit of the [facility] 95% population interval (>=10.0 uM) are defined as high relative risk for a cardiovascular event relative to those with TMAO <6.2 uM. (References: 1-Tang et al. Macy Mis J Med. 2013; 368:1575-1584. 2-Heianza Y, et al. J Am Heart Assoc. 2017;6(7)).       PHQ2/9:    10/13/2022    2:27 PM 05/27/2022    7:59 AM 05/14/2022    7:53 AM 05/13/2021    8:02 AM 05/08/2020   11:00 AM  Depression screen PHQ 2/9  Decreased Interest 2 0 0 0 0  Down, Depressed, Hopeless 0 0 0 0 0  PHQ - 2 Score 2 0 0 0 0  Altered sleeping 0 0 0 0 1  Tired, decreased energy 0 0 0 0 0  Change in appetite 0 0 0 0 0  Feeling bad or failure about yourself  0 0 0 0 0  Trouble concentrating 0 0 0 0 0  Moving slowly or fidgety/restless 0 0 0 0 0  Suicidal thoughts 0 0 0 0 0  PHQ-9 Score 2 0 0 0 1  Difficult doing work/chores Somewhat difficult        phq 9 is negative    Assessment & Plan  1. Fish odor syndrome (HCC)  - metroNIDAZOLE (FLAGYL) 500 MG tablet; Take 1 tablet (500 mg total) by mouth 2 (two) times daily for 7 days.  Dispense: 14 tablet; Refill: 0  2. Dyslipidemia  - pravastatin (PRAVACHOL) 20 MG tablet; Take 1 tablet (20 mg total) by mouth daily.  Dispense: 90 tablet; Refill: 0   3. Obsessive-compulsive disorder, unspecified  type  - escitalopram (LEXAPRO) 10 MG tablet; Take 1 tablet (10 mg total) by mouth daily.  Dispense: 90 tablet; Refill: 0  4. GAD (generalized anxiety disorder)  - escitalopram (LEXAPRO) 10 MG tablet; Take 1 tablet (10 mg total) by mouth daily.  Dispense: 90 tablet; Refill: 0

## 2022-10-13 ENCOUNTER — Ambulatory Visit: Payer: BC Managed Care – PPO | Admitting: Family Medicine

## 2022-10-13 ENCOUNTER — Encounter: Payer: Self-pay | Admitting: Family Medicine

## 2022-10-13 VITALS — BP 124/80 | HR 72 | Temp 97.7°F | Resp 14 | Ht 74.0 in | Wt 205.3 lb

## 2022-10-13 DIAGNOSIS — F429 Obsessive-compulsive disorder, unspecified: Secondary | ICD-10-CM | POA: Diagnosis not present

## 2022-10-13 DIAGNOSIS — F411 Generalized anxiety disorder: Secondary | ICD-10-CM | POA: Diagnosis not present

## 2022-10-13 DIAGNOSIS — E7252 Trimethylaminuria: Secondary | ICD-10-CM | POA: Diagnosis not present

## 2022-10-13 DIAGNOSIS — E785 Hyperlipidemia, unspecified: Secondary | ICD-10-CM

## 2022-10-13 MED ORDER — PRAVASTATIN SODIUM 20 MG PO TABS: 20.0000 mg | ORAL_TABLET | Freq: Every day | ORAL | 0 refills | Status: DC

## 2022-10-13 MED ORDER — ESCITALOPRAM OXALATE 10 MG PO TABS: 10.0000 mg | ORAL_TABLET | Freq: Every day | ORAL | 0 refills | Status: DC

## 2022-10-13 MED ORDER — METRONIDAZOLE 500 MG PO TABS: 500.0000 mg | ORAL_TABLET | Freq: Two times a day (BID) | ORAL | 0 refills | Status: AC

## 2022-10-13 NOTE — Patient Instructions (Signed)
Take only metronidazole first  After that you can add lexapro ( escitalopram ) in the mornings  Once you have taken lexapro for one week you can add pravastatin

## 2022-10-14 ENCOUNTER — Telehealth: Payer: Self-pay | Admitting: Family Medicine

## 2022-10-14 NOTE — Telephone Encounter (Unsigned)
Copied from CRM [id number]. Topic: General - Other >> Oct 14, 2022  1:48 PM Ja-Kwan M wrote: Reason for CRM: Pt stated he saw Dr. Carlynn Purl on yesterday and he wanted to make her aware that on his right side under his ribs the area is sore and tender.

## 2023-01-12 NOTE — Progress Notes (Deleted)
Name: Gilbert Buchanan   MRN: [id number]    DOB: [birth year]   Date:01/12/2023       Progress Note  Subjective  Chief Complaint  Follow up  HPI  GAD: he also has OCD, and history of ADD as a child ( but could not tolerate medication) he has tried multiple medications in the past - but states he could not tolerate the side effects of medications, he is off alprazolam, he also stopped Buspar and Wellbutrin, only taking Trazodone during the weekdays to sleep  Hyperlipidemia: he was on Vascepa but insurance did not pay for it, he is currently on diet, LDL went up to 165 but he does not want to statin therapy.   The 10-year ASCVD risk score (Arnett DK, et al., 2019) is: 4.8%   Values used to calculate the score:     Age: 52 years     Sex: Male     Is Non-Hispanic African American: No     Diabetic: No     Tobacco smoker: No     Systolic Blood Pressure: 122 mmHg     Is BP treated: No     HDL Cholesterol: 40 mg/dL     Total Cholesterol: 228 mg/dL    Overweight:  he was obese since childhood. He lost a lot of weight since fall 2016, doing well, his weight has been stable now, he was  stable between 197 lbs and 203 lbs , today it was 207 lbs but he did not take his jacket off.   Intermittent low back pain: he had severe symptoms of back pain with right lower leg radiculitis and right foot drop in 2017, he did not have MRI because of cost and went to chiropractor, he is doing well now, he no longer needs muscle relaxer. Weight loss and stretching is controlling symptoms   BPH: seen by Dr. Richardo Hanks and he is doing well on Flomax. Last year he was released from his care to get monitored here yearly.PSA normal   Low white count: he is not interested in rechecking labs at this time.  Patient Active Problem List   Diagnosis Date Noted   Other neutropenia (HCC) 05/27/2022   Intermittent low back pain 05/27/2022   Insomnia 05/27/2022   COVID-19 01/31/2020   Noise-induced hearing loss of both ears  06/28/2017   Baker's cyst of knee, right 12/25/2016   Dyslipidemia 11/11/2015   Hyperglycemia 11/11/2015   Chronic constipation 11/11/2015   GAD (generalized anxiety disorder) 11/11/2015   Prostatitis 05/05/2015   BPH with obstruction/lower urinary tract symptoms 05/05/2015    Past Surgical History:  Procedure Laterality Date   COLONOSCOPY WITH PROPOFOL N/A 07/02/2021   Procedure: COLONOSCOPY WITH PROPOFOL;  Surgeon: Wyline Mood, MD;  Location: [facility] ENDOSCOPY;  Service: Gastroenterology;  Laterality: N/A;   HERNIA REPAIR     VARICOCELE EXCISION      Family History  Problem Relation Age of Onset   Kidney disease Mother    Testicular cancer Father    Polycystic kidney disease Other    Heart disease Maternal Grandmother    Stroke Paternal Grandmother    Stroke Paternal Grandfather    Prostate cancer Neg Hx     Social History   Tobacco Use   Smoking status: Former    Current packs/day: 0.00    Average packs/day: 1 pack/day for 25.0 years (25.0 ttl pk-yrs)    Types: Cigarettes, Cigars    Start date: 06/09/1987    Quit date: 06/08/2012  Years since quitting: 10.6   Smokeless tobacco: Former    Types: Snuff    Quit date: 05/07/2017   Tobacco comments:    N/A  Substance Use Topics   Alcohol use: Yes    Alcohol/week: 0.0 standard drinks of alcohol     Current Outpatient Medications:    escitalopram (LEXAPRO) 10 MG tablet, Take 1 tablet (10 mg total) by mouth daily., Disp: 90 tablet, Rfl: 0   fluticasone (FLONASE) 50 MCG/ACT nasal spray, SPRAY 2 SPRAYS INTO EACH NOSTRIL EVERY DAY, Disp: 48 mL, Rfl: 1   Multiple Vitamin (MULTIVITAMIN) tablet, Take 1 tablet by mouth daily., Disp: , Rfl:    pravastatin (PRAVACHOL) 20 MG tablet, Take 1 tablet (20 mg total) by mouth daily., Disp: 90 tablet, Rfl: 0   tamsulosin (FLOMAX) 0.4 MG CAPS capsule, Take 1 capsule (0.4 mg total) by mouth daily., Disp: 90 capsule, Rfl: 3   traZODone (DESYREL) 50 MG tablet, Take 1 tablet (50 mg total) by  mouth at bedtime., Disp: 90 tablet, Rfl: 3  Allergies  Allergen Reactions   Acyclovir And Related     I personally reviewed {Reviewed:14835} with the patient/caregiver today.   ROS  ***  Objective  There were no vitals filed for this visit.  There is no height or weight on file to calculate BMI.  Physical Exam ***  No results found for this or any previous visit (from the past 2160 hour(s)).  Diabetic Foot Exam: Diabetic Foot Exam - Simple   No data filed    ***  PHQ2/9:    10/13/2022    2:27 PM 05/27/2022    7:59 AM 05/14/2022    7:53 AM 05/13/2021    8:02 AM 05/08/2020   11:00 AM  Depression screen PHQ 2/9  Decreased Interest 2 0 0 0 0  Down, Depressed, Hopeless 0 0 0 0 0  PHQ - 2 Score 2 0 0 0 0  Altered sleeping 0 0 0 0 1  Tired, decreased energy 0 0 0 0 0  Change in appetite 0 0 0 0 0  Feeling bad or failure about yourself  0 0 0 0 0  Trouble concentrating 0 0 0 0 0  Moving slowly or fidgety/restless 0 0 0 0 0  Suicidal thoughts 0 0 0 0 0  PHQ-9 Score 2 0 0 0 1  Difficult doing work/chores Somewhat difficult        phq 9 is {gen pos [id number]} ***  Fall Risk:    10/13/2022    2:27 PM 05/27/2022    7:59 AM 05/14/2022    7:53 AM 05/13/2021    8:02 AM 05/08/2020   10:59 AM  Fall Risk   Falls in the past year? 0 0 0 0 0  Number falls in past yr:  0 0 0 0  Injury with Fall?  0 0 0 0  Risk for fall due to : No Fall Risks No Fall Risks No Fall Risks No Fall Risks   Follow up Falls prevention discussed Falls prevention discussed Falls prevention discussed Falls prevention discussed    ***   Functional Status Survey:   ***   Assessment & Plan  *** There are no diagnoses linked to this encounter.

## 2023-01-13 ENCOUNTER — Ambulatory Visit: Payer: BC Managed Care – PPO | Admitting: Family Medicine

## 2023-01-26 ENCOUNTER — Other Ambulatory Visit: Payer: Self-pay | Admitting: Family Medicine

## 2023-01-26 DIAGNOSIS — F411 Generalized anxiety disorder: Secondary | ICD-10-CM

## 2023-01-26 DIAGNOSIS — E785 Hyperlipidemia, unspecified: Secondary | ICD-10-CM

## 2023-01-26 DIAGNOSIS — F429 Obsessive-compulsive disorder, unspecified: Secondary | ICD-10-CM

## 2023-04-16 DIAGNOSIS — D2261 Melanocytic nevi of right upper limb, including shoulder: Secondary | ICD-10-CM | POA: Diagnosis not present

## 2023-04-16 DIAGNOSIS — D2262 Melanocytic nevi of left upper limb, including shoulder: Secondary | ICD-10-CM | POA: Diagnosis not present

## 2023-04-16 DIAGNOSIS — D2272 Melanocytic nevi of left lower limb, including hip: Secondary | ICD-10-CM | POA: Diagnosis not present

## 2023-04-16 DIAGNOSIS — L918 Other hypertrophic disorders of the skin: Secondary | ICD-10-CM | POA: Diagnosis not present

## 2023-04-16 DIAGNOSIS — D225 Melanocytic nevi of trunk: Secondary | ICD-10-CM | POA: Diagnosis not present

## 2023-04-16 DIAGNOSIS — L538 Other specified erythematous conditions: Secondary | ICD-10-CM | POA: Diagnosis not present

## 2023-04-16 DIAGNOSIS — R238 Other skin changes: Secondary | ICD-10-CM | POA: Diagnosis not present

## 2023-05-05 NOTE — Progress Notes (Signed)
Name: Gilbert Buchanan   MRN: [id number]    DOB: October 06, 1970   Date:05/18/2023       Progress Note  Subjective  Chief Complaint  Chief Complaint  Patient presents with   Annual Exam    HPI  Patient presents for annual CPE .  IPSS Questionnaire (AUA-7): Over the past month.   1)  How often have you had a sensation of not emptying your bladder completely after you finish urinating?  2 - Less than half the time  2)  How often have you had to urinate again less than two hours after you finished urinating? 5 - Almost always  3)  How often have you found you stopped and started again several times when you urinated?  0 - Not at all  4) How difficult have you found it to postpone urination?  0 - Not at all  5) How often have you had a weak urinary stream?  5 - Almost always  6) How often have you had to push or strain to begin urination?  0 - Not at all  7) How many times did you most typically get up to urinate from the time you went to bed until the time you got up in the morning?  0 - None  Total score:  0-7 mildly symptomatic   8-19 moderately symptomatic   20-35 severely symptomatic     Diet: balanced , eats at home  Exercise: working in the farm Last Dental Exam: up to date Last Eye Exam: up to date   Depression: phq 9 is negative    05/18/2023    8:26 AM 10/13/2022    2:27 PM 05/27/2022    7:59 AM 05/14/2022    7:53 AM 05/13/2021    8:02 AM  Depression screen PHQ 2/9  Decreased Interest 0 2 0 0 0  Down, Depressed, Hopeless 0 0 0 0 0  PHQ - 2 Score 0 2 0 0 0  Altered sleeping 0 0 0 0 0  Tired, decreased energy 0 0 0 0 0  Change in appetite 0 0 0 0 0  Feeling bad or failure about yourself  0 0 0 0 0  Trouble concentrating 0 0 0 0 0  Moving slowly or fidgety/restless 0 0 0 0 0  Suicidal thoughts 0 0 0 0 0  PHQ-9 Score 0 2 0 0 0  Difficult doing work/chores Not difficult at all Somewhat difficult       Hypertension:  BP Readings from Last 3 Encounters:  05/18/23  114/76  10/13/22 124/80  09/23/22 124/82    Obesity: Wt Readings from Last 3 Encounters:  05/18/23 204 lb 12.8 oz (92.9 kg)  10/13/22 205 lb 4.8 oz (93.1 kg)  09/23/22 204 lb 14.4 oz (92.9 kg)   BMI Readings from Last 3 Encounters:  05/18/23 26.29 kg/m  10/13/22 26.36 kg/m  09/23/22 26.31 kg/m     Lipids:  Lab Results  Component Value Date   CHOL 228 (H) 05/14/2022   CHOL 204 (H) 05/13/2021   CHOL 230 (H) 11/10/2019   Lab Results  Component Value Date   HDL 40 05/14/2022   HDL 42 05/13/2021   HDL 46 11/10/2019   Lab Results  Component Value Date   LDLCALC 165 (H) 05/14/2022   LDLCALC 137 (H) 05/13/2021   LDLCALC 156 (H) 11/10/2019   Lab Results  Component Value Date   TRIG 117 05/14/2022   TRIG 126 05/13/2021   TRIG 150 (  H) 11/10/2019   Lab Results  Component Value Date   CHOLHDL 5.7 (H) 05/14/2022   CHOLHDL 4.9 05/13/2021   CHOLHDL 5.0 (H) 11/10/2019   No results found for: "LDLDIRECT" Glucose:  Glucose  Date Value Ref Range Status  01/24/2013 137 (H) 65 - 99 mg/dL Final   Glucose, Bld  Date Value Ref Range Status  05/14/2022 109 (H) 65 - 99 mg/dL Final    Comment:    .            Fasting reference interval . For someone without known diabetes, a glucose value between 100 and 125 mg/dL is consistent with prediabetes and should be confirmed with a follow-up test. .   05/13/2021 98 65 - 99 mg/dL Final    Comment:    .            Fasting reference interval .   11/10/2019 104 (H) 65 - 99 mg/dL Final    Comment:    .            Fasting reference interval . For someone without known diabetes, a glucose value between 100 and 125 mg/dL is consistent with prediabetes and should be confirmed with a follow-up test. .     Flowsheet Row Office Visit from 05/18/2023 in [facility]  AUDIT-C Score 8      Patient drinks occasionally but when he drinks - it is low carb beer but drinks over 12 per day    Married STD testing and prevention (HIV/chl/gon/syphilis):  not interested  Sexual history: he states he has no problems with an erection but has delayed ejaculation  Hep C Screening: Complete Skin cancer: Discussed monitoring for atypical lesions Colorectal cancer: 07/02/21 Prostate cancer:  yes Due/Ordered Lab Results  Component Value Date   PSA 1.01 05/14/2022   PSA 1.47 05/13/2021   PSA 1.0 11/10/2019     Lung cancer:  Low Dose CT Chest recommended if Age 60-80 years, 30 pack-year currently smoking OR have quit w/in 15years. Patient  not applicable a candidate for screening   AAA: The USPSTF recommends one-time screening with ultrasonography in men ages 71 to 75 years who have ever smoked. Patient is not a candidate  ECG:  01/24/2013  Vaccines:   Tdap: 11/11/2015 Shingrix: discussed with patient  Pneumonia: discussed with patient  Flu: Due/Ordered COVID-19: 3 doses  Advanced Care Planning: A voluntary discussion about advance care planning including the explanation and discussion of advance directives.  Discussed health care proxy and Living will, and the patient was able to identify a health care proxy as wife .  Patient does have a living will and power of attorney of health care   Patient Active Problem List   Diagnosis Date Noted   Other neutropenia (HCC) 05/27/2022   Intermittent low back pain 05/27/2022   Insomnia 05/27/2022   COVID-19 01/31/2020   Noise-induced hearing loss of both ears 06/28/2017   Baker's cyst of knee, right 12/25/2016   Dyslipidemia 11/11/2015   Hyperglycemia 11/11/2015   Chronic constipation 11/11/2015   GAD (generalized anxiety disorder) 11/11/2015   Prostatitis 05/05/2015   BPH with obstruction/lower urinary tract symptoms 05/05/2015    Past Surgical History:  Procedure Laterality Date   COLONOSCOPY WITH PROPOFOL N/A 07/02/2021   Procedure: COLONOSCOPY WITH PROPOFOL;  Surgeon: Wyline Mood, MD;  Location: [facility] ENDOSCOPY;  Service:  Gastroenterology;  Laterality: N/A;   HERNIA REPAIR     VARICOCELE EXCISION      Family  History  Problem Relation Age of Onset   Kidney disease Mother    Testicular cancer Father    Polycystic kidney disease Other    Heart disease Maternal Grandmother    Stroke Paternal Grandmother    Stroke Paternal Grandfather    Prostate cancer Neg Hx     Social History   Socioeconomic History   Marital status: Married    Spouse name: Angelique   Number of children: 2   Years of education: Not on file   Highest education level: Bachelor's degree (e.g., BA, AB, BS)  Occupational History   Occupation: Solicitor   Tobacco Use   Smoking status: Former    Current packs/day: 0.00    Average packs/day: 1 pack/day for 25.0 years (25.0 ttl pk-yrs)    Types: Cigarettes, Cigars    Start date: 06/09/1987    Quit date: 06/08/2012    Years since quitting: 10.9   Smokeless tobacco: Former    Types: Snuff    Quit date: 05/07/2017   Tobacco comments:    N/A  Vaping Use   Vaping status: Former   Start date: 06/28/2014   Quit date: 06/08/2016  Substance and Sexual Activity   Alcohol use: Yes    Comment: occasionally   Drug use: No   Sexual activity: Yes    Partners: Female  Other Topics Concern   Not on file  Social History Narrative   Married, two children at home    He work in [place] now , Solicitor    Social Determinants of Health   Financial Resource Strain: Low Risk  (05/14/2022)   Overall Financial Resource Strain (CARDIA)    Difficulty of Paying Living Expenses: Not hard at all  Food Insecurity: No Food Insecurity (05/14/2022)   Hunger Vital Sign    Worried About Running Out of Food in the Last Year: Never true    Ran Out of Food in the Last Year: Never true  Transportation Needs: No Transportation Needs (05/14/2022)   PRAPARE - Administrator, Civil Service (Medical): No    Lack of Transportation (Non-Medical): No  Physical Activity: Insufficiently Active  (05/14/2022)   Exercise Vital Sign    Days of Exercise per Week: 3 days    Minutes of Exercise per Session: 40 min  Stress: Stress Concern Present (05/14/2022)   [employer] of Occupational Health - Occupational Stress Questionnaire    Feeling of Stress : To some extent  Social Connections: Moderately Integrated (05/14/2022)   Social Connection and Isolation Panel [NHANES]    Frequency of Communication with Friends and Family: More than three times a week    Frequency of Social Gatherings with Friends and Family: Once a week    Attends Religious Services: More than 4 times per year    Active Member of [employer] or Organizations: No    Attends Banker Meetings: Never    Marital Status: Married  Catering manager Violence: Not At Risk (05/14/2022)   Humiliation, Afraid, Rape, and Kick questionnaire    Fear of Current or Ex-Partner: No    Emotionally Abused: No    Physically Abused: No    Sexually Abused: No     Current Outpatient Medications:    escitalopram (LEXAPRO) 10 MG tablet, TAKE 1 TABLET(10 MG) BY MOUTH DAILY, Disp: 90 tablet, Rfl: 1   fluticasone (FLONASE) 50 MCG/ACT nasal spray, SPRAY 2 SPRAYS INTO EACH NOSTRIL EVERY DAY, Disp: 48 mL, Rfl: 1   pravastatin (PRAVACHOL) 20  MG tablet, TAKE 1 TABLET(20 MG) BY MOUTH DAILY, Disp: 90 tablet, Rfl: 1   traZODone (DESYREL) 50 MG tablet, Take 1 tablet (50 mg total) by mouth at bedtime., Disp: 90 tablet, Rfl: 3   tamsulosin (FLOMAX) 0.4 MG CAPS capsule, Take 1 capsule (0.4 mg total) by mouth daily., Disp: 90 capsule, Rfl: 0  Allergies  Allergen Reactions   Acyclovir And Related      ROS  Constitutional: Negative for fever or weight change.  Respiratory: Negative for cough and shortness of breath.   Cardiovascular: Negative for chest pain or palpitations.  Gastrointestinal: Negative for abdominal pain, no bowel changes.  Musculoskeletal: Negative for gait problem or joint swelling.  Skin: Negative for rash.   Neurological: Negative for dizziness or headache.  No other specific complaints in a complete review of systems (except as listed in HPI above).    Objective  Vitals:   05/18/23 0832  BP: 114/76  Pulse: 72  Resp: 16  Temp: 97.6 F (36.4 C)  TempSrc: Oral  SpO2: 97%  Weight: 204 lb 12.8 oz (92.9 kg)  Height: 6\' 2"  (1.88 m)    Body mass index is 26.29 kg/m.  Physical Exam  Constitutional: Patient appears well-developed and well-nourished. No distress.  HENT: Head: Normocephalic and atraumatic. Ears: B TMs ok, no erythema or effusion; Nose: Nose normal. Mouth/Throat: Oropharynx is clear and moist. No oropharyngeal exudate.  Eyes: Conjunctivae and EOM are normal. Pupils are equal, round, and reactive to light. No scleral icterus.  Neck: Normal range of motion. Neck supple. No JVD present. No thyromegaly present.  Cardiovascular: Normal rate, regular rhythm and normal heart sounds.  No murmur heard. No BLE edema. Pulmonary/Chest: Effort normal and breath sounds normal. No respiratory distress. Abdominal: Soft. Bowel sounds are normal, no distension. There is no tenderness. no masses MALE GENITALIA: Normal descended testes bilaterally, no masses palpated, scar from hernia repair on right and slight bulging on left , no lesions, no discharge RECTAL: Prostate enlarged in size normal consistency, no rectal masses or hemorrhoids Musculoskeletal: Normal range of motion, no joint effusions. No gross deformities Neurological: he is alert and oriented to person, place, and time. No cranial nerve deficit. Coordination, balance, strength, speech and gait are normal.  Skin: Skin is warm and dry. No rash noted. No erythema.  Psychiatric: Patient has a normal mood and affect. behavior is normal. Judgment and thought content normal.    Fall Risk:    05/18/2023    8:26 AM 10/13/2022    2:27 PM 05/27/2022    7:59 AM 05/14/2022    7:53 AM 05/13/2021    8:02 AM  Fall Risk   Falls in the past  year? 0 0 0 0 0  Number falls in past yr: 0  0 0 0  Injury with Fall? 0  0 0 0  Risk for fall due to : No Fall Risks No Fall Risks No Fall Risks No Fall Risks No Fall Risks  Follow up Falls prevention discussed;Education provided;Falls evaluation completed Falls prevention discussed Falls prevention discussed Falls prevention discussed Falls prevention discussed     Functional Status Survey: Is the patient deaf or have difficulty hearing?: No Does the patient have difficulty seeing, even when wearing glasses/contacts?: No Does the patient have difficulty concentrating, remembering, or making decisions?: No Does the patient have difficulty walking or climbing stairs?: No Does the patient have difficulty dressing or bathing?: No Does the patient have difficulty doing errands alone such as visiting a doctor's  office or shopping?: No    Assessment & Plan 1. Well adult exam  - PSA - Lipid panel - COMPLETE METABOLIC PANEL WITH GFR - CBC with Differential/Platelet - Hemoglobin A1c - Pneumococcal conjugate vaccine 20-valent (Prevnar 20)  2. Screening for prostate cancer  - PSA  3. Need for influenza vaccination  - Flu vaccine trivalent PF, 6mos and older(Flulaval,Afluria,Fluarix,Fluzone)  4. Other neutropenia (HCC)  - CBC with Differential/Platelet  5. Long-term use of high-risk medication  - COMPLETE METABOLIC PANEL WITH GFR - CBC with Differential/Platelet  6. Hyperglycemia  - Hemoglobin A1c  7. BPH with obstruction/lower urinary tract symptoms  - PSA  8. Dyslipidemia  - Lipid panel  9. Need for pneumococcal 20-valent conjugate vaccination  - Pneumococcal conjugate vaccine 20-valent (Prevnar 20)   -Prostate cancer screening and PSA options (with potential risks and benefits of testing vs not testing) were discussed along with recent recs/guidelines. -USPSTF grade A and B recommendations reviewed with patient; age-appropriate recommendations, preventive care,  screening tests, etc discussed and encouraged; healthy living encouraged; see AVS for patient education given to patient -Discussed importance of 150 minutes of physical activity weekly, eat two servings of fish weekly, eat one serving of tree nuts ( cashews, pistachios, pecans, almonds.Marland Kitchen) every other day, eat 6 servings of fruit/vegetables daily and drink plenty of water and avoid sweet beverages.  -Reviewed Health Maintenance: yes

## 2023-05-18 ENCOUNTER — Ambulatory Visit (INDEPENDENT_AMBULATORY_CARE_PROVIDER_SITE_OTHER): Payer: BC Managed Care – PPO | Admitting: Family Medicine

## 2023-05-18 ENCOUNTER — Encounter: Payer: Self-pay | Admitting: Family Medicine

## 2023-05-18 VITALS — BP 114/76 | HR 72 | Temp 97.6°F | Resp 16 | Ht 74.0 in | Wt 204.8 lb

## 2023-05-18 DIAGNOSIS — D708 Other neutropenia: Secondary | ICD-10-CM

## 2023-05-18 DIAGNOSIS — R739 Hyperglycemia, unspecified: Secondary | ICD-10-CM

## 2023-05-18 DIAGNOSIS — E785 Hyperlipidemia, unspecified: Secondary | ICD-10-CM

## 2023-05-18 DIAGNOSIS — N401 Enlarged prostate with lower urinary tract symptoms: Secondary | ICD-10-CM | POA: Diagnosis not present

## 2023-05-18 DIAGNOSIS — Z0001 Encounter for general adult medical examination with abnormal findings: Secondary | ICD-10-CM | POA: Diagnosis not present

## 2023-05-18 DIAGNOSIS — Z125 Encounter for screening for malignant neoplasm of prostate: Secondary | ICD-10-CM

## 2023-05-18 DIAGNOSIS — Z23 Encounter for immunization: Secondary | ICD-10-CM | POA: Diagnosis not present

## 2023-05-18 DIAGNOSIS — Z79899 Other long term (current) drug therapy: Secondary | ICD-10-CM | POA: Diagnosis not present

## 2023-05-18 DIAGNOSIS — N138 Other obstructive and reflux uropathy: Secondary | ICD-10-CM | POA: Diagnosis not present

## 2023-05-18 DIAGNOSIS — Z0000 Encounter for general adult medical examination without abnormal findings: Secondary | ICD-10-CM

## 2023-05-18 MED ORDER — TAMSULOSIN HCL 0.4 MG PO CAPS: 0.4000 mg | ORAL_CAPSULE | Freq: Every day | ORAL | 0 refills | Status: DC

## 2023-05-19 LAB — COMPLETE METABOLIC PANEL WITH GFR
AG Ratio: 2.1 (calc) (ref 1.0–2.5)
ALT: 36 U/L (ref 9–46)
AST: 28 U/L (ref 10–35)
Albumin: 4.2 g/dL (ref 3.6–5.1)
Alkaline phosphatase (APISO): 39 U/L (ref 35–144)
BUN: 15 mg/dL (ref 7–25)
CO2: 31 mmol/L (ref 20–32)
Calcium: 9.2 mg/dL (ref 8.6–10.3)
Chloride: 104 mmol/L (ref 98–110)
Creat: 0.98 mg/dL (ref 0.70–1.30)
Globulin: 2 g/dL (ref 1.9–3.7)
Glucose, Bld: 102 mg/dL — ABNORMAL HIGH (ref 65–99)
Potassium: 4.4 mmol/L (ref 3.5–5.3)
Sodium: 139 mmol/L (ref 135–146)
Total Bilirubin: 0.7 mg/dL (ref 0.2–1.2)
Total Protein: 6.2 g/dL (ref 6.1–8.1)
eGFR: 93 mL/min/{1.73_m2} (ref >=60)

## 2023-05-19 LAB — CBC WITH DIFFERENTIAL/PLATELET
Absolute Lymphocytes: 1124 {cells}/uL (ref 850–3900)
Absolute Monocytes: 280 {cells}/uL (ref 200–950)
Basophils Absolute: 11 {cells}/uL (ref 0–200)
Basophils Relative: 0.3 %
Eosinophils Absolute: 60 {cells}/uL (ref 15–500)
Eosinophils Relative: 1.7 %
HCT: 42.2 % (ref 38.5–50.0)
Hemoglobin: 14.4 g/dL (ref 13.2–17.1)
MCH: 32.1 pg (ref 27.0–33.0)
MCHC: 34.1 g/dL (ref 32.0–36.0)
MCV: 94 fL (ref 80.0–100.0)
MPV: 11 fL (ref 7.5–12.5)
Monocytes Relative: 8 %
Neutro Abs: 2027 {cells}/uL (ref 1500–7800)
Neutrophils Relative %: 57.9 %
Platelets: 174 10*3/uL (ref 140–400)
RBC: 4.49 10*6/uL (ref 4.20–5.80)
RDW: 11.7 % (ref 11.0–15.0)
Total Lymphocyte: 32.1 %
WBC: 3.5 10*3/uL — ABNORMAL LOW (ref 3.8–10.8)

## 2023-05-19 LAB — HEMOGLOBIN A1C
Hgb A1c MFr Bld: 5.3 %{Hb} (ref <5.7)
Mean Plasma Glucose: 105 mg/dL
eAG (mmol/L): 5.8 mmol/L

## 2023-05-19 LAB — LIPID PANEL
Cholesterol: 194 mg/dL (ref <200)
HDL: 47 mg/dL (ref >=40)
LDL Cholesterol (Calc): 126 mg/dL — ABNORMAL HIGH
Non-HDL Cholesterol (Calc): 147 mg/dL — ABNORMAL HIGH (ref <130)
Total CHOL/HDL Ratio: 4.1 (calc) (ref <5.0)
Triglycerides: 103 mg/dL (ref <150)

## 2023-05-19 LAB — PSA: PSA: 1.06 ng/mL (ref <=4.00)

## 2023-06-13 ENCOUNTER — Other Ambulatory Visit: Payer: Self-pay | Admitting: Family Medicine

## 2023-06-13 DIAGNOSIS — G4700 Insomnia, unspecified: Secondary | ICD-10-CM

## 2023-07-21 ENCOUNTER — Encounter: Payer: Self-pay | Admitting: Family Medicine

## 2023-07-21 ENCOUNTER — Ambulatory Visit: Payer: BC Managed Care – PPO | Admitting: Family Medicine

## 2023-07-21 ENCOUNTER — Telehealth: Payer: BC Managed Care – PPO | Admitting: Family Medicine

## 2023-07-21 DIAGNOSIS — J069 Acute upper respiratory infection, unspecified: Secondary | ICD-10-CM

## 2023-07-21 DIAGNOSIS — N401 Enlarged prostate with lower urinary tract symptoms: Secondary | ICD-10-CM | POA: Diagnosis not present

## 2023-07-21 DIAGNOSIS — F411 Generalized anxiety disorder: Secondary | ICD-10-CM

## 2023-07-21 DIAGNOSIS — F429 Obsessive-compulsive disorder, unspecified: Secondary | ICD-10-CM | POA: Diagnosis not present

## 2023-07-21 DIAGNOSIS — R0981 Nasal congestion: Secondary | ICD-10-CM

## 2023-07-21 DIAGNOSIS — E785 Hyperlipidemia, unspecified: Secondary | ICD-10-CM | POA: Diagnosis not present

## 2023-07-21 DIAGNOSIS — N138 Other obstructive and reflux uropathy: Secondary | ICD-10-CM

## 2023-07-21 DIAGNOSIS — G4700 Insomnia, unspecified: Secondary | ICD-10-CM

## 2023-07-21 MED ORDER — ROSUVASTATIN CALCIUM 10 MG PO TABS: 10.0000 mg | ORAL_TABLET | Freq: Every day | ORAL | 3 refills | Status: AC

## 2023-07-21 MED ORDER — ESCITALOPRAM OXALATE 10 MG PO TABS: 10.0000 mg | ORAL_TABLET | Freq: Every day | ORAL | 1 refills | Status: DC

## 2023-07-21 MED ORDER — BENZONATATE 100 MG PO CAPS: 100.0000 mg | ORAL_CAPSULE | Freq: Three times a day (TID) | ORAL | 0 refills | Status: DC | PRN

## 2023-07-21 MED ORDER — TAMSULOSIN HCL 0.4 MG PO CAPS: 0.4000 mg | ORAL_CAPSULE | Freq: Every day | ORAL | 1 refills | Status: DC

## 2023-07-21 MED ORDER — FLUTICASONE PROPIONATE 50 MCG/ACT NA SUSP: 2.0000 | Freq: Every day | NASAL | 0 refills | Status: AC

## 2023-07-21 MED ORDER — TRAZODONE HCL 50 MG PO TABS: 50.0000 mg | ORAL_TABLET | Freq: Every day | ORAL | 0 refills | Status: DC

## 2023-07-21 MED ORDER — HYDROCOD POLI-CHLORPHE POLI ER 10-8 MG/5ML PO SUER: 5.0000 mL | Freq: Every evening | ORAL | 0 refills | Status: DC | PRN

## 2023-07-21 NOTE — Progress Notes (Signed)
Name: Gilbert Buchanan   MRN: [id number]    DOB: 1971-05-13   Date:07/21/2023       Progress Note  Subjective  Chief Complaint  Chief Complaint  Patient presents with   Medical Management of Chronic Issues    I connected with  Gilbert Buchanan  on 07/21/23 at  1:20 PM EST by a video enabled telemedicine application and verified that I am speaking with the correct person using two identifiers.  I discussed the limitations of evaluation and management by telemedicine and the availability of in person appointments. The patient expressed understanding and agreed to proceed with the virtual visit  Staff also discussed with the patient that there may be a patient responsible charge related to this service. Patient Location: at work Provider Location: [facility] Additional Individuals present: alone  Discussed the use of AI scribe software for clinical note transcription with the patient, who gave verbal consent to proceed.  History of Present Illness   Gilbert Buchanan is a 53 year old male who presents with symptoms of a head cold and cough.  He has been experiencing symptoms consistent with a head cold since last Thursday, which progressed to a severe dry cough over the weekend. The cough is non-productive and has not been alleviated by over-the-counter medications like DayQuil. He also experiences congestion and postnasal drainage, but no facial pressure. Shortness of breath occurs, particularly with movement, and he attributes nausea to mucus drainage into his stomach. No fever or chills are present, and headaches are mild. His appetite remains good.  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 He has not conducted any rapid flu or COVID tests, although he considered doing so the previous night.  He continues to use trazodone for sleep, which he finds effective. He has a history of anxiety, OCD, and  ADD, and is currently taking Lexapro (escitalopram) for these conditions, which he feels helps manage his symptoms. He is also on pravastatin 20 mg for dyslipidemia, although he notes his cholesterol levels are still not optimal. He has previously taken Vascepa, which he felt improved his blood work. Additionally, he takes Flomax for BPH with obstructive symptoms, which he reports has been effective in preventing major flare-ups.        Patient Active Problem List   Diagnosis Date Noted   Other neutropenia (HCC) 05/27/2022   Intermittent low back pain 05/27/2022   Insomnia 05/27/2022   COVID-19 01/31/2020   Noise-induced hearing loss of both ears 06/28/2017   Baker's cyst of knee, right 12/25/2016   Dyslipidemia 11/11/2015   Hyperglycemia 11/11/2015   Chronic constipation 11/11/2015   GAD (generalized anxiety disorder) 11/11/2015   Prostatitis 05/05/2015   BPH with obstruction/lower urinary tract symptoms 05/05/2015    Past Surgical History:  Procedure Laterality Date   COLONOSCOPY WITH PROPOFOL N/A 07/02/2021   Procedure: COLONOSCOPY WITH PROPOFOL;  Surgeon: Wyline Mood, MD;  Location: [facility] END[facility]COPY;  Service: Gastroenterology;  Laterality: N/A;   HERNIA REPAIR     VARICOCELE EXCISION      Family History  Problem Relation Age of Onset   Kidney disease Mother    Testicular cancer Father    Polycystic kidney disease Other    Heart disease Maternal Grandmother    Stroke Paternal Grandmother    Stroke Paternal Grandfather    Prostate cancer Neg Hx     Social History   Socioeconomic History  Marital status: Married    Spouse name: Angelique   Number of children: 2   Years of education: Not on file   Highest education level: Bachelor's degree (e.g., BA, AB, BS)  Occupational History   Occupation: Solicitor   Tobacco Use   Smoking status: Former    Current packs/day: 0.00    Average packs/day: 1 pack/day for 25.0 years (25.0 ttl pk-yrs)    Types: Cigarettes, Cigars     Start date: 06/09/1987    Quit date: 06/08/2012    Years since quitting: 11.1   Smokeless tobacco: Former    Types: Snuff    Quit date: 05/07/2017   Tobacco comments:    N/A  Vaping Use   Vaping status: Former   Start date: 06/28/2014   Quit date: 06/08/2016  Substance and Sexual Activity   Alcohol use: Yes    Comment: occasionally   Drug use: No   Sexual activity: Yes    Partners: Female  Other Topics Concern   Not on file  Social History Narrative   Married, two children at home    He work in [place] now , Solicitor    Social Drivers of Health   Financial Resource Strain: Low Risk  (05/14/2022)   Overall Financial Resource Strain (CARDIA)    Difficulty of Paying Living Expenses: Not hard at all  Food Insecurity: No Food Insecurity (05/14/2022)   Hunger Vital Sign    Worried About Running Out of Food in the Last Year: Never true    Ran Out of Food in the Last Year: Never true  Transportation Needs: No Transportation Needs (05/14/2022)   PRAPARE - Administrator, Civil Service (Medical): No    Lack of Transportation (Non-Medical): No  Physical Activity: Insufficiently Active (05/14/2022)   Exercise Vital Sign    Days of Exercise per Week: 3 days    Minutes of Exercise per Session: 40 min  Stress: Stress Concern Present (05/14/2022)   [employer] of Occupational Health - Occupational Stress Questionnaire    Feeling of Stress : To some extent  Social Connections: Moderately Integrated (05/14/2022)   Social Connection and Isolation Panel [NHANES]    Frequency of Communication with Friends and Family: More than three times a week    Frequency of Social Gatherings with Friends and Family: Once a week    Attends Religious Services: More than 4 times per year    Active Member of [employer] or Organizations: No    Attends Banker Meetings: Never    Marital Status: Married  Catering manager Violence: Not At Risk (05/14/2022)   Humiliation, Afraid,  Rape, and Kick questionnaire    Fear of Current or Ex-Partner: No    Emotionally Abused: No    Physically Abused: No    Sexually Abused: No     Current Outpatient Medications:    escitalopram (LEXAPRO) 10 MG tablet, TAKE 1 TABLET(10 MG) BY MOUTH DAILY, Disp: 90 tablet, Rfl: 1   pravastatin (PRAVACHOL) 20 MG tablet, TAKE 1 TABLET(20 MG) BY MOUTH DAILY, Disp: 90 tablet, Rfl: 1   tamsulosin (FLOMAX) 0.4 MG CAPS capsule, Take 1 capsule (0.4 mg total) by mouth daily., Disp: 90 capsule, Rfl: 0   traZODone (DESYREL) 50 MG tablet, TAKE 1 TABLET(50 MG) BY MOUTH AT BEDTIME, Disp: 90 tablet, Rfl: 0   fluticasone (FLONASE) 50 MCG/ACT nasal spray, SPRAY 2 SPRAYS INTO EACH NOSTRIL EVERY DAY (Patient not taking: Reported on 07/21/2023), Disp: 48 mL, Rfl: 1  Allergies  Allergen Reactions   Acyclovir And Related     I personally reviewed active problem list, medication list, allergies with the patient/caregiver today.   ROS  Ten systems reviewed and is negative except as mentioned in HPI    Objective  Virtual encounter, vitals not obtained.  There is no height or weight on file to calculate BMI.  Physical Exam  Awake  PHQ2/9:    07/21/2023   11:25 AM 05/18/2023    8:26 AM 10/13/2022    2:27 PM 05/27/2022    7:59 AM 05/14/2022    7:53 AM  Depression screen PHQ 2/9  Decreased Interest 0 0 2 0 0  Down, Depressed, Hopeless 0 0 0 0 0  PHQ - 2 Score 0 0 2 0 0  Altered sleeping 0 0 0 0 0  Tired, decreased energy 0 0 0 0 0  Change in appetite 0 0 0 0 0  Feeling bad or failure about yourself  0 0 0 0 0  Trouble concentrating 0 0 0 0 0  Moving slowly or fidgety/restless 0 0 0 0 0  Suicidal thoughts 0 0 0 0 0  PHQ-9 Score 0 0 2 0 0  Difficult doing work/chores Not difficult at all Not difficult at all Somewhat difficult     PHQ-2/9 Result is negative.       07/21/2023   11:25 AM 10/13/2022    2:27 PM 10/25/2019    4:02 PM 05/08/2019    3:08 PM  GAD 7 : Generalized Anxiety Score   Nervous, Anxious, on Edge 0 1 1 2   Control/stop worrying 0 1 2 3   Worry too much - different things 0 1 2 3   Trouble relaxing 0 1 2 3   Restless 0 1 3 3   Easily annoyed or irritable 0 1 3 2   Afraid - awful might happen 0 1 2 0  Total GAD 7 Score 0 7 15 16   Anxiety Difficulty Not difficult at all Somewhat difficult  Somewhat difficult      Fall Risk:    07/21/2023   11:25 AM 05/18/2023    8:26 AM 10/13/2022    2:27 PM 05/27/2022    7:59 AM 05/14/2022    7:53 AM  Fall Risk   Falls in the past year? 0 0 0 0 0  Number falls in past yr: 0 0  0 0  Injury with Fall? 0 0  0 0  Risk for fall due to : No Fall Risks No Fall Risks No Fall Risks No Fall Risks No Fall Risks  Follow up Falls prevention discussed;Education provided;Falls evaluation completed Falls prevention discussed;Education provided;Falls evaluation completed Falls prevention discussed Falls prevention discussed Falls prevention discussed     Assessment and Plan    Upper Respiratory Infection Symptoms of head congestion, postnasal drip, and cough for 6 days. No fever or chills. Shortness of breath present but improving. No rapid flu or COVID test done. -Continue DayQuil as needed. -Start Tessalon Perles during the day for cough. -Start tussionex syrup at night for cough. -Resume Flonase for congestion. -Use saline spray for congestion. -If symptoms worsen or shortness of breath persists, contact office for possible antibiotic prescription.  Dyslipidemia LDL cholesterol of 126, goal is below 100. Patient previously on pravastatin 20mg . -Double dose of pravastatin to 40mg  (two 20mg  tablets) daily until current supply runs out. -Start rosuvastatin 10mg  daily after pravastatin runs out.  Anxiety and Obsessive-Compulsive Disorder Symptoms stable on escitalopram (Lexapro). -Continue escitalopram.  Benign Prostatic Hyperplasia No current symptoms, on Flomax. -Continue Flomax.  Insomnia Well controlled on  trazodone. -Continue trazodone.      I discussed the assessment and treatment plan with the patient. The patient was provided an opportunity to ask questions and all were answered. The patient agreed with the plan and demonstrated an understanding of the instructions.  The patient was advised to call back or seek an in-person evaluation if the symptoms worsen or if the condition fails to improve as anticipated.  I provided 25  minutes of non-face-to-face time during this encounter.

## 2023-07-23 ENCOUNTER — Ambulatory Visit: Payer: Self-pay

## 2023-07-23 ENCOUNTER — Other Ambulatory Visit: Payer: Self-pay | Admitting: Family Medicine

## 2023-07-23 DIAGNOSIS — J018 Other acute sinusitis: Secondary | ICD-10-CM

## 2023-07-23 MED ORDER — AZITHROMYCIN 500 MG PO TABS: 500.0000 mg | ORAL_TABLET | Freq: Every day | ORAL | 0 refills | Status: DC

## 2023-07-23 NOTE — Telephone Encounter (Signed)
  Chief Complaint: URI Symptoms: HA sinus pain, R ear popping, nasal congestion Frequency: 1 week or more  Pertinent Negatives: Patient denies fever or SOB Disposition: [] ED /[] Urgent Care (no appt availability in office) / [] Appointment(In office/virtual)/ []  [facility]/ [] Home Care/ [] Refused Recommended Disposition /[] [facility]/ [x]  Follow-up with PCP Additional Notes: Pt had VV on 07/21/23 with PCP and states the rxs she prescribed helped with the cough but still having a lot of sinus pain and feels like turned into infection now. Pt asking for abx to be sent in. Advised pt of care advice and I would send message to PCP for review. Pt would abx to go to [facility].   Reason for Disposition  [1] SEVERE pain AND [2] not improved 2 hours after pain medicine  Answer Assessment - Initial Assessment Questions 1. LOCATION: "Where does it hurt?"      Sinus area and head 2. ONSET: "When did the sinus pain start?"  (e.g., hours, days)      1 week  3. SEVERITY: "How bad is the pain?"   (Scale 1-10; mild, moderate or severe)   - MILD (1-3): doesn't interfere with normal activities    - MODERATE (4-7): interferes with normal activities (e.g., work or school) or awakens from sleep   - SEVERE (8-10): excruciating pain and patient unable to do any normal activities        7/10 5. NASAL CONGESTION: "Is the nose blocked?" If Yes, ask: "Can you open it or must you breathe through your mouth?"     yes 7. FEVER: "Do you have a fever?" If Yes, ask: "What is it, how was it measured, and when did it start?"      no 8. OTHER SYMPTOMS: "Do you have any other symptoms?" (e.g., sore throat, cough, earache, difficulty breathing)     Cough has improved, R ear popping  Protocols used: Sinus Pain or Congestion-A-AH

## 2023-08-30 ENCOUNTER — Telehealth: Payer: Self-pay

## 2023-08-30 NOTE — Telephone Encounter (Signed)
 Per Dr.Sowles note: LDL cholesterol of 126, goal is below 100. Patient previously on pravastatin 20mg . -Double dose of pravastatin to 40mg  (two 20mg  tablets) daily until current supply runs out. -Start rosuvastatin 10mg  daily after pravastatin runs out.   Dr.Sowles sent in a year supply of medication to [facility] in [place].      Copied from CRM [id number]. Topic: Clinical - Medication Question >> Aug 30, 2023  8:48 AM Elle L wrote: Reason for CRM: The patient states that he spoke to Dr. Carlynn Purl regarding changing his rosuvastatin (CRESTOR) 10 MG tablet to another prescription once he ran out and he is now out of the medication. I did confirm that he is not having symptoms from being out of the medication. The patient's call back number is [phone] if needed.  Preferred Pharmacy: [facility][id number] [place], [place] - [address] AT [facility] [address] [facility] [address] [place], [place] [address] Phone: [phone]  Fax: [phone]

## 2023-08-30 NOTE — Telephone Encounter (Signed)
 Pt called back and stated he wanted to confirm to ok to start new medication since he did not double the dose of pravastatin and I spoke to Dr.Sowles she stated it was fine as long as he finished the previous medication and could start new medication no problem. Pt was going to contact pharmacy to have it filled.

## 2023-11-08 ENCOUNTER — Telehealth: Payer: Self-pay

## 2023-11-08 NOTE — Telephone Encounter (Signed)
 Copied from CRM [id number]. Topic: Referral - Request for Referral >> Nov 08, 2023  3:32 PM Rosaria Common wrote: Did the patient discuss referral with their provider in the last year? Yes (If No - schedule appointment) (If Yes - send message)  Appointment offered? Yes  Type of order/referral and detailed reason for visit: Hernia in Groin area    Preference of office, provider, location: [facility]  If referral order, have you been seen by this specialty before? No (If Yes, this issue or another issue? When? Where?  Can we respond through MyChart? Yes

## 2023-11-09 NOTE — Telephone Encounter (Signed)
 Pt needs appt for referral. No documentation on previous visits regards this.

## 2023-11-10 ENCOUNTER — Ambulatory Visit (INDEPENDENT_AMBULATORY_CARE_PROVIDER_SITE_OTHER): Admitting: Family Medicine

## 2023-11-10 ENCOUNTER — Encounter: Payer: Self-pay | Admitting: Family Medicine

## 2023-11-10 ENCOUNTER — Telehealth: Payer: Self-pay

## 2023-11-10 VITALS — BP 124/82 | HR 74 | Resp 16 | Ht 74.0 in | Wt 207.3 lb

## 2023-11-10 DIAGNOSIS — F411 Generalized anxiety disorder: Secondary | ICD-10-CM | POA: Diagnosis not present

## 2023-11-10 DIAGNOSIS — F429 Obsessive-compulsive disorder, unspecified: Secondary | ICD-10-CM

## 2023-11-10 DIAGNOSIS — I861 Scrotal varices: Secondary | ICD-10-CM | POA: Diagnosis not present

## 2023-11-10 DIAGNOSIS — K4021 Bilateral inguinal hernia, without obstruction or gangrene, recurrent: Secondary | ICD-10-CM

## 2023-11-10 DIAGNOSIS — G4700 Insomnia, unspecified: Secondary | ICD-10-CM

## 2023-11-10 DIAGNOSIS — N401 Enlarged prostate with lower urinary tract symptoms: Secondary | ICD-10-CM

## 2023-11-10 DIAGNOSIS — N138 Other obstructive and reflux uropathy: Secondary | ICD-10-CM

## 2023-11-10 DIAGNOSIS — E785 Hyperlipidemia, unspecified: Secondary | ICD-10-CM

## 2023-11-10 MED ORDER — TRAZODONE HCL 50 MG PO TABS: 50.0000 mg | ORAL_TABLET | Freq: Every day | ORAL | 3 refills | Status: AC

## 2023-11-10 MED ORDER — TAMSULOSIN HCL 0.4 MG PO CAPS: 0.4000 mg | ORAL_CAPSULE | Freq: Every day | ORAL | 3 refills | Status: AC

## 2023-11-10 MED ORDER — ESCITALOPRAM OXALATE 10 MG PO TABS
10.0000 mg | ORAL_TABLET | Freq: Every day | ORAL | 3 refills | Status: DC
Start: 2023-11-10 — End: 2024-01-24

## 2023-11-10 NOTE — Telephone Encounter (Signed)
 Copied from CRM [id number]. Topic: Referral - Question >> Nov 10, 2023  4:00 PM Star East wrote: Reason for CRM: Received call from [facility] stating they Gilbert Sober do that procedure there, suggested [facility] but patient preferred [facility] if insurance is in network- [phone]

## 2023-11-10 NOTE — Progress Notes (Signed)
 Name: Gilbert Buchanan   MRN: [id number]    DOB: September 05, 1970   Date:11/10/2023       Progress Note  Subjective  Chief Complaint  Chief Complaint  Patient presents with   Hernia    slight bulging on left , no lesions, previous CPE noted   Discussed the use of AI scribe software for clinical note transcription with the patient, who gave verbal consent to proceed.  History of Present Illness Gilbert Buchanan is a 53 year old male who presents with recurrent hernia and varicocele symptoms.  He has a history of bilateral hernia repair in 1993. Recently, he noticed a recurrent bulge, which has become more pronounced and painful over time, especially on the left side, though the right side is more painful. He recalls the initial hernia repair was performed in the [facility] and received a letter indicating the mesh used was suboptimal.  He has a history of varicocele, surgically addressed in 2007. Recently, he observed a significant increase in the size of the varicocele, describing it as 'a whole handful now' and 'very painful.' This has become more bothersome than the hernia, causing significant discomfort and affecting his sleep.  He experiences frequent urination, approximately every fifteen minutes, and is currently taking Flomax  for urinary symptoms. He wants to discontinue Flomax  after addressing his current issues. He also takes rosuvastatin  for cholesterol management, Lexapro  for OCD, and trazodone  for sleep.   He mentions a history of back pain and foot drop but has not experienced a back episode in over a year. 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 with his wife.    Patient Active Problem List   Diagnosis Date Noted   Other neutropenia (HCC) 05/27/2022   Intermittent low back pain 05/27/2022   Insomnia 05/27/2022   COVID-19 01/31/2020   Noise-induced hearing loss of both ears 06/28/2017   Baker's  cyst of knee, right 12/25/2016   Dyslipidemia 11/11/2015   Hyperglycemia 11/11/2015   Chronic constipation 11/11/2015   GAD (generalized anxiety disorder) 11/11/2015   Prostatitis 05/05/2015   BPH with obstruction/lower urinary tract symptoms 05/05/2015    Past Surgical History:  Procedure Laterality Date   COLONOSCOPY WITH PROPOFOL  N/A 07/02/2021   Procedure: COLONOSCOPY WITH PROPOFOL ;  Surgeon: Luke Salaam, MD;  Location: [facility];  Service: Gastroenterology;  Laterality: N/A;   HERNIA REPAIR     VARICOCELE EXCISION      Family History  Problem Relation Age of Onset   Kidney disease Mother    Testicular cancer Father    Polycystic kidney disease Other    Heart disease Maternal Grandmother    Stroke Paternal Grandmother    Stroke Paternal Grandfather    Prostate cancer Neg Hx     Social History   Tobacco Use   Smoking status: Former    Current packs/day: 0.00    Average packs/day: 1 pack/day for 25.0 years (25.0 ttl pk-yrs)    Types: Cigarettes, Cigars    Start date: 06/09/1987    Quit date: 06/08/2012    Years since quitting: 11.4   Smokeless tobacco: Former    Types: Snuff    Quit date: 05/07/2017   Tobacco comments:    N/A  Substance Use Topics   Alcohol use: Yes    Comment: occasionally     Current Outpatient Medications:    azithromycin  (ZITHROMAX ) 500 MG tablet, Take 1 tablet (500 mg total)  by mouth daily., Disp: 3 tablet, Rfl: 0   chlorpheniramine-HYDROcodone (TUSSIONEX) 10-8 MG/5ML, Take 5 mLs by mouth at bedtime as needed for cough., Disp: 115 mL, Rfl: 0   escitalopram  (LEXAPRO ) 10 MG tablet, Take 1 tablet (10 mg total) by mouth daily., Disp: 90 tablet, Rfl: 1   fluticasone  (FLONASE ) 50 MCG/ACT nasal spray, Place 2 sprays into both nostrils daily., Disp: 16 mL, Rfl: 0   rosuvastatin  (CRESTOR ) 10 MG tablet, Take 1 tablet (10 mg total) by mouth daily. In place of pravastatin , Disp: 90 tablet, Rfl: 3   tamsulosin  (FLOMAX ) 0.4 MG CAPS capsule, Take 1  capsule (0.4 mg total) by mouth daily., Disp: 90 capsule, Rfl: 1   traZODone  (DESYREL ) 50 MG tablet, Take 1 tablet (50 mg total) by mouth at bedtime., Disp: 90 tablet, Rfl: 0   benzonatate  (TESSALON ) 100 MG capsule, Take 1 capsule (100 mg total) by mouth 3 (three) times daily as needed for cough., Disp: 40 capsule, Rfl: 0  Allergies  Allergen Reactions   Acyclovir And Related     I personally reviewed active problem list, medication list, allergies with the patient/caregiver today.   ROS  Ten systems reviewed and is negative except as mentioned in HPI    Objective Physical Exam CONSTITUTIONAL: Patient appears well-developed and well-nourished. No distress. HEENT: Head atraumatic, normocephalic, neck supple. CARDIOVASCULAR: Normal rate, regular rhythm and normal heart sounds. No murmur heard. No BLE edema. PULMONARY: Effort normal and breath sounds normal. Lungs clear to auscultation. No respiratory distress. ABDOMINAL: There is no tenderness or distention. PSYCHIATRIC: Patient has a normal mood and affect. Behavior is normal. Judgment and thought content normal. GENITOURINARY: Right varicocele present, scar from previous inguinal hernia repairs bilaterally, bulging worse on left side, discomfort worse on right side  Vitals:   11/10/23 1340  BP: 124/82  Pulse: 74  Resp: 16  SpO2: 100%  Weight: 207 lb 4.8 oz (94 kg)  Height: 6\' 2"  (1.88 m)    Body mass index is 26.62 kg/m.  No results found for this or any previous visit (from the past 2160 hours).  Diabetic Foot Exam:     PHQ2/9:    11/10/2023    1:38 PM 07/21/2023   11:25 AM 05/18/2023    8:26 AM 10/13/2022    2:27 PM 05/27/2022    7:59 AM  Depression screen PHQ 2/9  Decreased Interest 0 0 0 2 0  Down, Depressed, Hopeless 0 0 0 0 0  PHQ - 2 Score 0 0 0 2 0  Altered sleeping  0 0 0 0  Tired, decreased energy  0 0 0 0  Change in appetite  0 0 0 0  Feeling bad or failure about yourself   0 0 0 0  Trouble  concentrating  0 0 0 0  Moving slowly or fidgety/restless  0 0 0 0  Suicidal thoughts  0 0 0 0  PHQ-9 Score  0 0 2 0  Difficult doing work/chores  Not difficult at all Not difficult at all Somewhat difficult     phq 9 is negative  Fall Risk:    11/10/2023    1:38 PM 07/21/2023   11:25 AM 05/18/2023    8:26 AM 10/13/2022    2:27 PM 05/27/2022    7:59 AM  Fall Risk   Falls in the past year? 0 0 0 0 0  Number falls in past yr: 0 0 0  0  Injury with Fall? 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 Left bulging, right pain. Prefers simultaneous repair before insurance changes. - Refer to general surgeon for evaluation and management. - Coordinate with urologist for concurrent varicocele management if possible.  Varicocele Significant increase in size and pain on right side. Prefers addressing varicocele first due to pain severity. - Refer to urologist for evaluation and management. - Coordinate with general surgeon for concurrent hernia management if possible.  Benign Prostatic Hyperplasia (BPH) Long-standing BPH with frequent urination. On tamsulosin . Desires surgical intervention to alleviate symptoms and discontinue medication. - Continue tamsulosin  (Flomax ). - Discuss potential surgical options with urologist during varicocele evaluation.  Hyperlipidemia On rosuvastatin  with improved but suboptimal cholesterol levels. Advised against discontinuation due to cardiovascular risk. - Continue rosuvastatin . - Reassess cholesterol levels in  December.  Obsessive-Compulsive Disorder (OCD) Managed with Lexapro . Reports ongoing symptoms but managing well. - Continue Lexapro . - Provide a one-year supply.  Insomnia Uses trazodone  for sleep. - Continue trazodone . - Provide a one-year supply.  General Health Maintenance Up to date on tetanus vaccination, next due in 2027. Educated on tick bite prevention and management. - Ensure tetanus vaccination is up to date. - Educate on tick bite prevention and management.  Follow-up Advised to follow up for physical and routine check-ups. - Schedule follow-up visit in one year for physical and routine check-up.

## 2023-11-10 NOTE — Telephone Encounter (Signed)
 Nothing noted in the past year per patient yall discussed it during CPE in Dec.

## 2023-11-10 NOTE — Telephone Encounter (Signed)
 Copied from CRM [id number]. Topic: Referral - Question >> Nov 09, 2023  4:59 PM Alpha Arts wrote: Reason for CRM: Patient wants to know why he must come back in the office for an appointment. Patient stated he already spoke with Dr. Ava Lei about his hernia during his physical and would like a referral.  Callback #: [id number]

## 2023-11-11 ENCOUNTER — Ambulatory Visit: Payer: Self-pay | Admitting: General Surgery

## 2023-11-11 ENCOUNTER — Encounter: Payer: Self-pay | Admitting: General Surgery

## 2023-11-11 VITALS — BP 117/73 | HR 60 | Ht 74.0 in | Wt 207.0 lb

## 2023-11-11 DIAGNOSIS — K4021 Bilateral inguinal hernia, without obstruction or gangrene, recurrent: Secondary | ICD-10-CM

## 2023-11-11 NOTE — Progress Notes (Unsigned)
 Patient ID: Gilbert Buchanan, male   DOB: 07/09/1970, 53 y.o.   MRN: [id number] CC: Bilateral Inguinal Hernia Repair History of Present Illness Gilbert Buchanan is a 53 y.o. male with past medical history as below who presents in consultation for bilateral inguinal hernias.  The patient reports that he started to notice pain in his bilateral groins and bulging in December.  He says since then he feels like it is gotten bigger and he is having more pain in his right groin.  He reports that this is similar to the pain he had when he had them repaired in the early 90s when he was in the [employer].  He denies any obstructive symptoms and denies any overlying skin changes.  He also has a varicocele that he has had repaired before.  Past Medical History Past Medical History:  Diagnosis Date  . Anxiety   . Elevated blood pressure (not hypertension)   . History of Lyme disease   . Hyperlipidemia   . Incomplete bladder emptying   . Over weight   . Prostatitis   . Stones, prostate        Past Surgical History:  Procedure Laterality Date  . COLONOSCOPY WITH PROPOFOL  N/A 07/02/2021   Procedure: COLONOSCOPY WITH PROPOFOL ;  Surgeon: Luke Salaam, MD;  Location: [facility];  Service: Gastroenterology;  Laterality: N/A;  . HERNIA REPAIR    . VARICOCELE EXCISION      Allergies  Allergen Reactions  . Acyclovir And Related     Current Outpatient Medications  Medication Sig Dispense Refill  . escitalopram  (LEXAPRO ) 10 MG tablet Take 1 tablet (10 mg total) by mouth daily. 90 tablet 3  . fluticasone  (FLONASE ) 50 MCG/ACT nasal spray Place 2 sprays into both nostrils daily. 16 mL 0  . rosuvastatin  (CRESTOR ) 10 MG tablet Take 1 tablet (10 mg total) by mouth daily. In place of pravastatin  90 tablet 3  . tamsulosin  (FLOMAX ) 0.4 MG CAPS capsule Take 1 capsule (0.4 mg total) by mouth daily. 90 capsule 3  . traZODone  (DESYREL ) 50 MG tablet Take 1 tablet (50 mg total) by mouth at bedtime. 90 tablet 3   No current  facility-administered medications for this visit.    Family History Family History  Problem Relation Age of Onset  . Kidney disease Mother   . Testicular cancer Father   . Polycystic kidney disease Other   . Heart disease Maternal Grandmother   . Stroke Paternal Grandmother   . Stroke Paternal Grandfather   . Prostate cancer Neg Hx      ***  Social History Social History   Tobacco Use  . Smoking status: Former    Current packs/day: 0.00    Average packs/day: 1 pack/day for 25.0 years (25.0 ttl pk-yrs)    Types: Cigarettes, Cigars    Start date: 06/09/1987    Quit date: 06/08/2012    Years since quitting: 11.4    Passive exposure: Past  . Smokeless tobacco: Former    Types: Snuff    Quit date: 05/07/2017  . Tobacco comments:    N/A  Vaping Use  . Vaping status: Former  . Start date: 06/28/2014  . Quit date: 06/08/2016  Substance Use Topics  . Alcohol use: Yes    Comment: occasionally  . Drug use: No     ***   ROS Full ROS of systems performed and is otherwise negative there than what is stated in the HPI  Physical Exam Blood pressure 117/73, pulse 60, height  6\' 2"  (1.88 m), weight 207 lb (93.9 kg), SpO2 97%.  CONSTITUTIONAL:  EYES: Pupils equal, round, and reactive to light, Sclera non-icteric. EARS, NOSE, MOUTH AND THROAT: The oropharynx is clear. Hearing is intact to voice.  NECK: Trachea is midline, and there is no jugular venous distension.  RESPIRATORY:  Lungs are clear, and breath sounds are equal bilaterally. Normal respiratory effort without pathologic use of accessory muscles. CARDIOVASCULAR: Heart is regular without murmurs, gallops, or rubs. GI: The abdomen is *** soft, nontender, and nondistended. There were no palpable masses. There was no hepatosplenomegaly. There were normal bowel sounds. GU: *** MUSCULOSKELETAL:  Normal muscle strength and tone in all four extremities.    NEUROLOGIC:  Motor and sensation is grossly normal.  Cranial nerves are  grossly intact.  Data Reviewed ***  I have personally reviewed the patient's imaging and medical records.    Assessment    ***  Plan    ***    Barrett Lick 11/11/2023, 3:25 PM

## 2023-11-11 NOTE — Patient Instructions (Signed)
 You have chose to have your hernia repaired. This will be done by Dr. Maurine Minister at [facility].  Please see your (blue) Pre-care information that you have been given today. Our surgery scheduler will call you to verify surgery date and to go over information.   You will need to arrange to be out of work for approximately 1-2 weeks and then you may return with a lifting restriction for 4 more weeks. If you have FMLA or Disability paperwork that needs to be filled out, please have your company fax your paperwork to [phone] or you may drop this by either office. This paperwork will be filled out within 3 days after your surgery has been completed.  You may have a bruise in your groin and also swelling and brusing in your testicle area. You may use ice 4-5 times daily for 15-20 minutes each time. Make sure that you place a barrier between you and the ice pack. To decrease the swelling, you may roll up a bath towel and place it vertically in between your thighs with your testicles resting on the towel. You will want to keep this area elevated as much as possible for several days following surgery.    Inguinal Hernia, Adult Muscles help keep everything in the body in its proper place. But if a weak spot in the muscles develops, something can poke through. That is called a hernia. When this happens in the lower part of the belly (abdomen), it is called an inguinal hernia. (It takes its name from a part of the body in this region called the inguinal canal.) A weak spot in the wall of muscles lets some fat or part of the small intestine bulge through. An inguinal hernia can develop at any age. Men get them more often than women. CAUSES  In adults, an inguinal hernia develops over time. It can be triggered by: Suddenly straining the muscles of the lower abdomen. Lifting heavy objects. Straining to have a bowel movement. Difficult bowel movements (constipation) can lead to this. Constant coughing. This  may be caused by smoking or lung disease. Being overweight. Being pregnant. Working at a job that requires long periods of standing or heavy lifting. Having had an inguinal hernia before. One type can be an emergency situation. It is called a strangulated inguinal hernia. It develops if part of the small intestine slips through the weak spot and cannot get back into the abdomen. The blood supply can be cut off. If that happens, part of the intestine may die. This situation requires emergency surgery. SYMPTOMS  Often, a small inguinal hernia has no symptoms. It is found when a healthcare provider does a physical exam. Larger hernias usually have symptoms.  In adults, symptoms may include: A lump in the groin. This is easier to see when the person is standing. It might disappear when lying down. In men, a lump in the scrotum. Pain or burning in the groin. This occurs especially when lifting, straining or coughing. A dull ache or feeling of pressure in the groin. Signs of a strangulated hernia can include: A bulge in the groin that becomes very painful and tender to the touch. A bulge that turns red or purple. Fever, nausea and vomiting. Inability to have a bowel movement or to pass gas. DIAGNOSIS  To decide if you have an inguinal hernia, a healthcare provider will probably do a physical examination. This will include asking questions about any symptoms you have noticed. The healthcare provider might  feel the groin area and ask you to cough. If an inguinal hernia is felt, the healthcare provider may try to slide it back into the abdomen. Usually no other tests are needed. TREATMENT  Treatments can vary. The size of the hernia makes a difference. Options include: Watchful waiting. This is often suggested if the hernia is small and you have had no symptoms. No medical procedure will be done unless symptoms develop. You will need to watch closely for symptoms. If any occur, contact your  healthcare provider right away. Surgery. This is used if the hernia is larger or you have symptoms. Open surgery. This is usually an outpatient procedure (you will not stay overnight in a hospital). An cut (incision) is made through the skin in the groin. The hernia is put back inside the abdomen. The weak area in the muscles is then repaired by herniorrhaphy or hernioplasty. Herniorrhaphy: in this type of surgery, the weak muscles are sewn back together. Hernioplasty: a patch or mesh is used to close the weak area in the abdominal wall. Laparoscopy. In this procedure, a surgeon makes small incisions. A thin tube with a tiny video camera (called a laparoscope) is put into the abdomen. The surgeon repairs the hernia with mesh by looking with the video camera and using two long instruments. HOME CARE INSTRUCTIONS  After surgery to repair an inguinal hernia: You will need to take pain medicine prescribed by your healthcare provider. Follow all directions carefully. You will need to take care of the wound from the incision. Your activity will be restricted for awhile. This will probably include no heavy lifting for several weeks. You also should not do anything too active for a few weeks. When you can return to work will depend on the type of job that you have. During "watchful waiting" periods, you should: Maintain a healthy weight. Eat a diet high in fiber (fruits, vegetables and whole grains). Drink plenty of fluids to avoid constipation. This means drinking enough water and other liquids to keep your urine clear or pale yellow. Do not lift heavy objects. Do not stand for long periods of time. Quit smoking. This should keep you from developing a frequent cough. SEEK MEDICAL CARE IF:  A bulge develops in your groin area. You feel pain, a burning sensation or pressure in the groin. This might be worse if you are lifting or straining. You develop a fever of more than 100.5 F (38.1 C). SEEK  IMMEDIATE MEDICAL CARE IF:  Pain in the groin increases suddenly. A bulge in the groin gets bigger suddenly and does not go down. For men, there is sudden pain in the scrotum. Or, the size of the scrotum increases. A bulge in the groin area becomes red or purple and is painful to touch. You have nausea or vomiting that does not go away. You feel your heart beating much faster than normal. You cannot have a bowel movement or pass gas. You develop a fever of more than 102.0 F (38.9 C).   This information is not intended to replace advice given to you by your health care provider. Make sure you discuss any questions you have with your health care provider.   Document Released: 10/11/2008 Document Revised: 08/17/2011 Document Reviewed: 11/26/2014 Elsevier Interactive Patient Education [employer]

## 2023-11-15 ENCOUNTER — Telehealth: Payer: Self-pay | Admitting: General Surgery

## 2023-11-15 NOTE — Telephone Encounter (Signed)
 Patient has been advised of Pre-Admission date/time, and Surgery date at [facility].  Surgery Date: 11/29/23 Preadmission Testing Date: 11/22/23 (phone 1p-4p)  Patient informed of the scheduling process and surgery information given at time of office visit.  Patient has been made aware to call [phone], between 1-3:00pm the day before surgery, to find out what time to arrive for surgery.

## 2023-11-18 NOTE — Telephone Encounter (Unsigned)
 Copied from CRM [id number]. Topic: General - Call Back - No Documentation >> Nov 18, 2023  8:58 AM Alpha Arts wrote: Reason for CRM: Patient is requesting to speak with someone from the front office  Callback #: [id number]

## 2023-11-18 NOTE — Telephone Encounter (Unsigned)
 Copied from CRM [id number]. Topic: Referral - Request for Referral >> Nov 18, 2023  8:56 AM Alpha Arts wrote: Did the patient discuss referral with their provider in the last year? Yes (If No - schedule appointment) (If Yes - send message)  Appointment offered? No  Type of order/referral and detailed reason for visit: Patient needs new referral for Bilateral recurrent inguinal hernia without obstruction or gangrene. His current one does not do that kind of surgery  Preference of office, provider, location: No preference  If referral order, have you been seen by this specialty before? No (If Yes, this issue or another issue? When? Where?  Can we respond through MyChart? No

## 2023-11-18 NOTE — Telephone Encounter (Signed)
 Per patient it was urology referral not the hernia situation. Already in the process with Gilbert Buchanan getting it fixed for patient.

## 2023-11-18 NOTE — Telephone Encounter (Signed)
 Please proceed with [facility]

## 2023-11-18 NOTE — Telephone Encounter (Signed)
 Pt called back following up on this referral to be sent to another place. Please advice

## 2023-11-22 ENCOUNTER — Other Ambulatory Visit: Payer: Self-pay

## 2023-11-22 ENCOUNTER — Encounter
Admission: RE | Admit: 2023-11-22 | Discharge: 2023-11-22 | Disposition: A | Discharge status: Home / Self Care | Source: Ambulatory Visit | Attending: General Surgery | Admitting: General Surgery

## 2023-11-22 NOTE — Patient Instructions (Addendum)
 Your procedure is scheduled WU:[id number] JUNE 23  Report to the Registration Desk on the 1st floor of the [facility] To find out your arrival time, please call [phone] between 1PM - 3PM on:  FRIDAY JUNE 20  If your arrival time is 6:00 am, do not arrive before that time as the [facility] entrance doors do not open until 6:00 am.  REMEMBER: Instructions that are not followed completely may result in serious medical risk, up to and including death; or upon the discretion of your surgeon and anesthesiologist your surgery may need to be rescheduled.  Do not eat food after midnight the night before surgery.  No gum chewing or hard candies.  You may however, drink CLEAR liquids up to 2 hours before you are scheduled to arrive for your surgery. Do not drink anything within 2 hours of your scheduled arrival time.  Clear liquids include: - water  - apple juice without pulp - gatorade (not RED colors) - black coffee or tea (Do NOT add milk or creamers to the coffee or tea) Do NOT drink anything that is not on this list.   One week prior to surgery:  MONDAY JUNE 16  Stop Anti-inflammatories (NSAIDS) such as Advil, Aleve , Ibuprofen, Motrin, Naproxen , Naprosyn  and Aspirin based products such as Excedrin, Goody's Powder, BC Powder. Stop ANY OVER THE COUNTER supplements until after surgery. fluticasone  (FLONASE )   You may however, continue to take Tylenol if needed for pain up until the day of surgery.  Continue taking all of your other prescription medications up until the day of surgery.  ON THE DAY OF SURGERY ONLY TAKE THESE MEDICATIONS WITH SIPS OF WATER:  escitalopram  (LEXAPRO )   No Alcohol for 24 hours before or after surgery.  No chewable tobacco products for at least 6 hours before surgery.   Do not use any recreational drugs for at least a week (preferably 2 weeks) before your surgery.  Please be advised that the combination of cocaine and anesthesia may have negative  outcomes, up to and including death. If you test positive for cocaine, your surgery will be cancelled.  On the morning of surgery brush your teeth with toothpaste and water, you may rinse your mouth with mouthwash if you wish. Do not swallow any toothpaste or mouthwash.  Use CHG Soap as directed on instruction sheet.  Do not wear jewelry, make-up, hairpins, clips or nail polish.  For welded (permanent) jewelry: bracelets, anklets, waist bands, etc.  Please have this removed prior to surgery.  If it is not removed, there is a chance that hospital personnel will need to cut it off on the day of surgery.  Do not wear lotions, powders, or perfumes.   Do not shave body hair from the neck down 48 hours before surgery.  Do not bring valuables to the hospital. [facility] is not responsible for any missing/lost belongings or valuables.   Notify your doctor if there is any change in your medical condition (cold, fever, infection).  Wear comfortable clothing (specific to your surgery type) to the hospital.  After surgery, you can help prevent lung complications by doing breathing exercises.  Take deep breaths and cough every 1-2 hours.  If you are being discharged the day of surgery, you will not be allowed to drive home. You will need a responsible individual to drive you home and stay with you for 24 hours after surgery.   If you are taking public transportation, you will need to have a  responsible individual with you.  Please call the Pre-admissions Testing Dept. at [phone] if you have any questions about these instructions.  Surgery Visitation Policy:  Patients having surgery or a procedure may have two visitors.  Children under the age of 61 must have an adult with them who is not the patient.      Preparing for Surgery with CHLORHEXIDINE GLUCONATE (CHG) Soap  Chlorhexidine Gluconate (CHG) Soap  o An antiseptic cleaner that kills germs and bonds with the skin to  continue killing germs even after washing  o Used for showering the night before surgery and morning of surgery  Before surgery, you can play an important role by reducing the number of germs on your skin.  CHG (Chlorhexidine gluconate) soap is an antiseptic cleanser which kills germs and bonds with the skin to continue killing germs even after washing.  Please do not use if you have an allergy to CHG or antibacterial soaps. If your skin becomes reddened/irritated stop using the CHG.  1. Shower the NIGHT BEFORE SURGERY and the MORNING OF SURGERY with CHG soap.  2. If you choose to wash your hair, wash your hair first as usual with your normal shampoo.  3. After shampooing, rinse your hair and body thoroughly to remove the shampoo.  4. Use CHG as you would any other liquid soap. You can apply CHG directly to the skin and wash gently with a scrungie or a clean washcloth.  5. Apply the CHG soap to your body only from the neck down. Do not use on open wounds or open sores. Avoid contact with your eyes, ears, mouth, and genitals (private parts). Wash face and genitals (private parts) with your normal soap.  6. Wash thoroughly, paying special attention to the area where your surgery will be performed.  7. Thoroughly rinse your body with warm water.  8. Do not shower/wash with your normal soap after using and rinsing off the CHG soap.  9. Pat yourself dry with a clean towel.  10. Wear clean pajamas to bed the night before surgery.  11. Place clean sheets on your bed the night of your first shower and do not sleep with pets.  12. Shower again with the CHG soap on the day of surgery prior to arriving at the hospital.  13. Do not apply any deodorants/lotions/powders.  14. Please wear clean clothes to the hospital.

## 2023-11-28 MED ORDER — CHLORHEXIDINE GLUCONATE 0.12 % MT SOLN
15.0000 mL | Freq: Once | OROMUCOSAL | Status: AC
  Administered 2023-11-29: 15 mL via OROMUCOSAL

## 2023-11-28 MED ORDER — CEFAZOLIN SODIUM-DEXTROSE 2-4 GM/100ML-% IV SOLN
2.0000 g | INTRAVENOUS | Status: AC
  Administered 2023-11-29: 2 g via INTRAVENOUS

## 2023-11-28 MED ORDER — CHLORHEXIDINE GLUCONATE CLOTH 2 % EX PADS: 6.0000 | MEDICATED_PAD | Freq: Once | CUTANEOUS | Status: DC

## 2023-11-28 MED ORDER — LACTATED RINGERS IV SOLN: INTRAVENOUS | Status: DC

## 2023-11-28 MED ORDER — ORAL CARE MOUTH RINSE: 15.0000 mL | Freq: Once | OROMUCOSAL | Status: AC

## 2023-11-29 ENCOUNTER — Encounter
Admission: RE | Disposition: A | Discharge status: Home / Self Care | Payer: Self-pay | Source: Home / Self Care | Attending: General Surgery

## 2023-11-29 ENCOUNTER — Encounter: Payer: Self-pay | Admitting: General Surgery

## 2023-11-29 ENCOUNTER — Ambulatory Visit: Admitting: Anesthesiology

## 2023-11-29 ENCOUNTER — Other Ambulatory Visit: Payer: Self-pay

## 2023-11-29 ENCOUNTER — Ambulatory Visit
Admission: RE | Admit: 2023-11-29 | Discharge: 2023-11-29 | Disposition: A | Discharge status: Home / Self Care | Attending: General Surgery | Admitting: General Surgery

## 2023-11-29 DIAGNOSIS — K4020 Bilateral inguinal hernia, without obstruction or gangrene, not specified as recurrent: Secondary | ICD-10-CM | POA: Insufficient documentation

## 2023-11-29 DIAGNOSIS — Z87891 Personal history of nicotine dependence: Secondary | ICD-10-CM | POA: Insufficient documentation

## 2023-11-29 DIAGNOSIS — K4021 Bilateral inguinal hernia, without obstruction or gangrene, recurrent: Secondary | ICD-10-CM

## 2023-11-29 HISTORY — PX: INSERTION OF MESH: SHX5868

## 2023-11-29 HISTORY — PX: REPAIR, HERNIA, INGUINAL, BILATERAL, ROBOT-ASSISTED: SHX7636

## 2023-11-29 SURGERY — REPAIR, HERNIA, INGUINAL, BILATERAL, ROBOT-ASSISTED
Anesthesia: General | Site: Inguinal | Laterality: Bilateral

## 2023-11-29 MED ORDER — BUPIVACAINE LIPOSOME 1.3 % IJ SUSP
INTRAMUSCULAR | Status: AC
  Filled 2023-11-29: qty 10

## 2023-11-29 MED ORDER — BUPIVACAINE-EPINEPHRINE (PF) 0.5% -1:200000 IJ SOLN
INTRAMUSCULAR | Status: DC | PRN
  Administered 2023-11-29 (×2): 20 mL via INTRAMUSCULAR

## 2023-11-29 MED ORDER — ROCURONIUM BROMIDE 10 MG/ML (PF) SYRINGE
PREFILLED_SYRINGE | INTRAVENOUS | Status: AC
  Filled 2023-11-29: qty 10

## 2023-11-29 MED ORDER — HYDROMORPHONE HCL 1 MG/ML IJ SOLN
INTRAMUSCULAR | Status: DC | PRN
  Administered 2023-11-29 (×2): .5 mg via INTRAVENOUS

## 2023-11-29 MED ORDER — DEXAMETHASONE SODIUM PHOSPHATE 10 MG/ML IJ SOLN
INTRAMUSCULAR | Status: DC | PRN
  Administered 2023-11-29: 10 mg via INTRAVENOUS

## 2023-11-29 MED ORDER — HYDROMORPHONE HCL 1 MG/ML IJ SOLN
INTRAMUSCULAR | Status: AC
  Filled 2023-11-29: qty 1

## 2023-11-29 MED ORDER — ONDANSETRON HCL 4 MG/2ML IJ SOLN
INTRAMUSCULAR | Status: AC
Start: 2023-11-29 — End: 2023-11-29
  Filled 2023-11-29: qty 2

## 2023-11-29 MED ORDER — ACETAMINOPHEN 10 MG/ML IV SOLN
INTRAVENOUS | Status: DC | PRN
  Administered 2023-11-29: 1000 mg via INTRAVENOUS

## 2023-11-29 MED ORDER — KETOROLAC TROMETHAMINE 30 MG/ML IJ SOLN
INTRAMUSCULAR | Status: DC | PRN
  Administered 2023-11-29: 30 mg via INTRAVENOUS

## 2023-11-29 MED ORDER — FENTANYL CITRATE (PF) 100 MCG/2ML IJ SOLN
INTRAMUSCULAR | Status: AC
  Filled 2023-11-29: qty 2

## 2023-11-29 MED ORDER — ONDANSETRON HCL 4 MG/2ML IJ SOLN
INTRAMUSCULAR | Status: DC | PRN
  Administered 2023-11-29: 4 mg via INTRAVENOUS

## 2023-11-29 MED ORDER — DROPERIDOL 2.5 MG/ML IJ SOLN: 0.6250 mg | Freq: Once | INTRAMUSCULAR | Status: DC | PRN

## 2023-11-29 MED ORDER — MIDAZOLAM HCL 2 MG/2ML IJ SOLN
INTRAMUSCULAR | Status: AC
Start: 2023-11-29 — End: 2023-11-29
  Filled 2023-11-29: qty 2

## 2023-11-29 MED ORDER — PROPOFOL 10 MG/ML IV BOLUS
INTRAVENOUS | Status: AC
  Filled 2023-11-29: qty 40

## 2023-11-29 MED ORDER — SUGAMMADEX SODIUM 200 MG/2ML IV SOLN
INTRAVENOUS | Status: DC | PRN
  Administered 2023-11-29: 200 mg via INTRAVENOUS
  Administered 2023-11-29: 100 mg via INTRAVENOUS

## 2023-11-29 MED ORDER — SODIUM CHLORIDE 0.9 % IR SOLN
Status: DC | PRN
  Administered 2023-11-29: 1000 mL

## 2023-11-29 MED ORDER — FENTANYL CITRATE (PF) 100 MCG/2ML IJ SOLN
INTRAMUSCULAR | Status: DC | PRN
  Administered 2023-11-29: 100 ug via INTRAVENOUS

## 2023-11-29 MED ORDER — BUPIVACAINE-EPINEPHRINE (PF) 0.5% -1:200000 IJ SOLN
INTRAMUSCULAR | Status: AC
  Filled 2023-11-29: qty 10

## 2023-11-29 MED ORDER — CEFAZOLIN SODIUM-DEXTROSE 2-4 GM/100ML-% IV SOLN
INTRAVENOUS | Status: AC
  Filled 2023-11-29: qty 100

## 2023-11-29 MED ORDER — ROCURONIUM BROMIDE 10 MG/ML (PF) SYRINGE
PREFILLED_SYRINGE | INTRAVENOUS | Status: AC
Start: 2023-11-29 — End: 2023-11-29
  Filled 2023-11-29: qty 10

## 2023-11-29 MED ORDER — SUCCINYLCHOLINE CHLORIDE 200 MG/10ML IV SOSY
PREFILLED_SYRINGE | INTRAVENOUS | Status: AC
  Filled 2023-11-29: qty 10

## 2023-11-29 MED ORDER — OXYCODONE HCL 5 MG PO TABS: 5.0000 mg | ORAL_TABLET | Freq: Four times a day (QID) | ORAL | 0 refills | Status: DC | PRN

## 2023-11-29 MED ORDER — PROPOFOL 10 MG/ML IV BOLUS
INTRAVENOUS | Status: DC | PRN
Start: 2023-11-29 — End: 2023-11-29
  Administered 2023-11-29: 200 mg via INTRAVENOUS

## 2023-11-29 MED ORDER — LIDOCAINE HCL (PF) 2 % IJ SOLN
INTRAMUSCULAR | Status: AC
  Filled 2023-11-29: qty 5

## 2023-11-29 MED ORDER — ROCURONIUM BROMIDE 100 MG/10ML IV SOLN
INTRAVENOUS | Status: DC | PRN
  Administered 2023-11-29: 10 mg via INTRAVENOUS
  Administered 2023-11-29: 60 mg via INTRAVENOUS
  Administered 2023-11-29 (×2): 20 mg via INTRAVENOUS
  Administered 2023-11-29: 10 mg via INTRAVENOUS
  Administered 2023-11-29: 20 mg via INTRAVENOUS

## 2023-11-29 MED ORDER — DEXAMETHASONE SODIUM PHOSPHATE 10 MG/ML IJ SOLN
INTRAMUSCULAR | Status: AC
  Filled 2023-11-29: qty 1

## 2023-11-29 MED ORDER — MIDAZOLAM HCL 2 MG/2ML IJ SOLN
INTRAMUSCULAR | Status: DC | PRN
  Administered 2023-11-29: 2 mg via INTRAVENOUS

## 2023-11-29 MED ORDER — ACETAMINOPHEN 10 MG/ML IV SOLN: 1000.0000 mg | Freq: Once | INTRAVENOUS | Status: DC | PRN

## 2023-11-29 MED ORDER — 0.9 % SODIUM CHLORIDE (POUR BTL) OPTIME
TOPICAL | Status: DC | PRN
  Administered 2023-11-29: 500 mL

## 2023-11-29 MED ORDER — KETOROLAC TROMETHAMINE 30 MG/ML IJ SOLN
INTRAMUSCULAR | Status: AC
  Filled 2023-11-29: qty 1

## 2023-11-29 MED ORDER — FENTANYL CITRATE (PF) 100 MCG/2ML IJ SOLN: 25.0000 ug | INTRAMUSCULAR | Status: DC | PRN

## 2023-11-29 MED ORDER — OXYCODONE HCL 5 MG/5ML PO SOLN: 5.0000 mg | Freq: Once | ORAL | Status: DC | PRN

## 2023-11-29 MED ORDER — OXYCODONE HCL 5 MG PO TABS: 5.0000 mg | ORAL_TABLET | Freq: Once | ORAL | Status: DC | PRN

## 2023-11-29 MED ORDER — ACETAMINOPHEN 10 MG/ML IV SOLN
INTRAVENOUS | Status: AC
  Filled 2023-11-29: qty 100

## 2023-11-29 MED ORDER — LIDOCAINE HCL (CARDIAC) PF 100 MG/5ML IV SOSY
PREFILLED_SYRINGE | INTRAVENOUS | Status: DC | PRN
  Administered 2023-11-29: 80 mg via INTRAVENOUS

## 2023-11-29 SURGICAL SUPPLY — 39 items
BAG PRESSURE INF REUSE 1000 (BAG) IMPLANT
COVER TIP SHEARS 8 DVNC (MISCELLANEOUS) ×1 IMPLANT
COVER WAND RF STERILE (DRAPES) ×1 IMPLANT
DERMABOND ADVANCED .7 DNX12 (GAUZE/BANDAGES/DRESSINGS) ×1 IMPLANT
DEVICE SECURE STRAP 25 ABSORB (INSTRUMENTS) IMPLANT
DRAPE ARM DVNC X/XI (DISPOSABLE) ×3 IMPLANT
DRAPE COLUMN DVNC XI (DISPOSABLE) ×1 IMPLANT
DRIVER NDL LRG 8 DVNC XI (INSTRUMENTS) ×1 IMPLANT
DRIVER NDLE LRG 8 DVNC XI (INSTRUMENTS) ×1 IMPLANT
ELECTRODE REM PT RTRN 9FT ADLT (ELECTROSURGICAL) ×1 IMPLANT
FORCEPS BPLR FENES DVNC XI (FORCEP) ×1 IMPLANT
FORCEPS BPLR R/ABLATION 8 DVNC (INSTRUMENTS) ×1 IMPLANT
GLOVE BIOGEL PI IND STRL 7.5 (GLOVE) ×2 IMPLANT
GLOVE SURG SYN 7.0 (GLOVE) ×3 IMPLANT
GLOVE SURG SYN 7.0 PF PI (GLOVE) ×2 IMPLANT
GOWN STRL REUS W/ TWL LRG LVL3 (GOWN DISPOSABLE) ×3 IMPLANT
IRRIGATOR SUCT 8 DISP DVNC XI (IRRIGATION / IRRIGATOR) IMPLANT
IV NS 1000ML BAXH (IV SOLUTION) IMPLANT
KIT PINK PAD W/HEAD ARM REST (MISCELLANEOUS) ×1 IMPLANT
LABEL OR SOLS (LABEL) IMPLANT
MANIFOLD NEPTUNE II (INSTRUMENTS) ×1 IMPLANT
MESH 3DMAX 4X6 LT LRG (Mesh General) IMPLANT
MESH PROGRIP LAP SLF FIX 16X12 (Mesh General) IMPLANT
NDL HYPO 22X1.5 SAFETY MO (MISCELLANEOUS) ×1 IMPLANT
NDL INSUFFLATION 14GA 120MM (NEEDLE) ×1 IMPLANT
NEEDLE HYPO 22X1.5 SAFETY MO (MISCELLANEOUS) ×1 IMPLANT
NEEDLE INSUFFLATION 14GA 120MM (NEEDLE) ×1 IMPLANT
OBTURATOR OPTICALSTD 8 DVNC (TROCAR) ×1 IMPLANT
PACK LAP CHOLECYSTECTOMY (MISCELLANEOUS) ×1 IMPLANT
SCISSORS MNPLR CVD DVNC XI (INSTRUMENTS) ×1 IMPLANT
SEAL UNIV 5-12 XI (MISCELLANEOUS) ×3 IMPLANT
SET TUBE SMOKE EVAC HIGH FLOW (TUBING) ×1 IMPLANT
SOLUTION ELECTROSURG ANTI STCK (MISCELLANEOUS) ×1 IMPLANT
SUT STRATA 2-0 23CM CT-2 (SUTURE) ×1 IMPLANT
SUT VIC AB 2-0 SH 27XBRD (SUTURE) ×1 IMPLANT
SUTURE MNCRL 4-0 27XMF (SUTURE) ×1 IMPLANT
TAPE TRANSPORE STRL 2 31045 (GAUZE/BANDAGES/DRESSINGS) IMPLANT
TRAP FLUID SMOKE EVACUATOR (MISCELLANEOUS) ×1 IMPLANT
WATER STERILE IRR 500ML POUR (IV SOLUTION) ×1 IMPLANT

## 2023-11-29 NOTE — Discharge Instructions (Signed)
 Given Tylenol 1,000 mg at 8:00 am  Given an NSAID in Or at 10:42 am next dose is due after 8 hrs if needed for pain.

## 2023-11-29 NOTE — Transfer of Care (Signed)
 Immediate Anesthesia Transfer of Care Note  Patient: Gilbert Buchanan  Procedure(s) Performed: REPAIR, HERNIA, INGUINAL, BILATERAL, ROBOT-ASSISTED (Bilateral: Inguinal) INSERTION OF MESH (Bilateral: Inguinal)  Patient Location: PACU  Anesthesia Type:General  Level of Consciousness: alert   Airway & Oxygen Therapy: Patient Spontanous Breathing  Post-op Assessment: Report given to RN and Post -op Vital signs reviewed and stable  Post vital signs: Reviewed and stable  Last Vitals:  Vitals Value Taken Time  BP 171/93 11/29/23 10:42  Temp    Pulse 78 11/29/23 10:44  Resp 14 11/29/23 10:44  SpO2 100 % 11/29/23 10:44  Vitals shown include unfiled device data.  Last Pain:  Vitals:   11/29/23 9367  TempSrc: Temporal  PainSc: 0-No pain         Complications: No notable events documented.

## 2023-11-29 NOTE — Anesthesia Preprocedure Evaluation (Addendum)
 Anesthesia Evaluation  Patient identified by MRN, date of birth, ID band Patient awake    Reviewed: Allergy & Precautions, H&P , NPO status , Patient's Chart, lab work & pertinent test results  Airway Mallampati: II  TM Distance: >3 FB Neck ROM: full    Dental no notable dental hx.    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 11/11/2015: Chronic constipation No date: Elevated blood pressure (not hypertension) No date: History of Lyme disease 11/11/2015: Hyperglycemia No date: Hyperlipidemia No date: Incomplete bladder emptying 05/27/2022: Insomnia 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 No date: Over weight No date: Prostatitis 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, prostate  Past Surgical History: 07/02/2021: COLONOSCOPY WITH PROPOFOL ; N/A     Comment:  Procedure: COLONOSCOPY WITH PROPOFOL ;  Surgeon: Therisa Bi, MD;  Location: [facility] ENDOSCOPY;  Service:               Gastroenterology;  Laterality: N/A; No date: HERNIA REPAIR No date: VARICOCELE EXCISION  BMI    Body Mass Index: 26.32 kg/m      Reproductive/Obstetrics negative OB ROS                             Anesthesia Physical Anesthesia Plan  ASA: 2  Anesthesia Plan: General ETT   Post-op Pain Management: Toradol IV (intra-op)* and Ofirmev IV (intra-op)*   Induction: Intravenous  PONV Risk Score and Plan: 2 and  Ondansetron, Dexamethasone and Midazolam  Airway Management Planned: Oral ETT  Additional Equipment:   Intra-op Plan:   Post-operative Plan: Extubation in OR  Informed Consent: I have reviewed the patients History and Physical, chart, labs and discussed the procedure including the risks, benefits and alternatives for the proposed anesthesia with the patient or authorized representative who has indicated his/her understanding and acceptance.     Dental Advisory Given  Plan Discussed with: CRNA and Surgeon  Anesthesia Plan Comments:         Anesthesia Quick Evaluation

## 2023-11-29 NOTE — Anesthesia Postprocedure Evaluation (Signed)
 Anesthesia Post Note  Patient: [name]  Procedure(s) Performed: REPAIR, HERNIA, INGUINAL, BILATERAL, ROBOT-ASSISTED (Bilateral: Inguinal) INSERTION OF MESH (Bilateral: Inguinal)  Patient location during evaluation: PACU Anesthesia Type: General Level of consciousness: awake and alert Pain management: pain level controlled Vital Signs Assessment: post-procedure vital signs reviewed and stable Respiratory status: spontaneous breathing, nonlabored ventilation and respiratory function stable Cardiovascular status: blood pressure returned to baseline and stable Postop Assessment: no apparent nausea or vomiting Anesthetic complications: no   No notable events documented.   Last Vitals:  Vitals:   11/29/23 1130 11/29/23 1137  BP: 129/78 124/80  Pulse: 63 60  Resp: 16 14  Temp:  (!) 36.1 C  SpO2: 100% 98%    Last Pain:  Vitals:   11/29/23 1137  TempSrc: Temporal  PainSc: 0-No pain                 Fairy POUR Jedediah Noda

## 2023-11-29 NOTE — Op Note (Signed)
 Procedure Date: November 29, 2023  Pre-operative Diagnosis: Bilateral inguinal hernia  Post-operative Diagnosis: Bilateral inguinal hernia  Procedure: 1.  Robotic assisted bilateral Inguinal Hernia Repair 2.  Creation of bilateral Posterior Rectus-Transversalis Fascia Advancment Flap for Coverage of Pelvic Wound (200 cm)  Surgeon:  Jayson Endow, M.D.   Anesthesia:  General endotracheal  Estimated Blood Loss:  20 ml  Specimens:  None  Complications:  none  Indications for Procedure:  This is a 53 y.o. male who presents with bilateral inguinal hernia.  The options of surgery versus observation were reviewed with the patient and/or family. The risks of bleeding, abscess or infection, recurrence of symptoms, potential for an open procedure, injury to surrounding structures, and chronic pain were all discussed with the patient and he was willing to proceed.  We have planned this transabdominal procedure with the creation of bilateral peritoneal flap based on the posterior rectus sheath and transversalis fascia in order to fully cover the mesh, creating a natural tisssue barrier for the bowel and peritoneal cavity.  Description of Procedure: The patient was correctly identified in the preoperative area and brought into the operating room.  The patient was placed supine with VTE prophylaxis in place.  Appropriate time-outs were performed.  Anesthesia was induced and the patient was intubated.   Appropriate antibiotics were infused.  The abdomen was prepped and draped in a sterile fashion. An incision was made at [place] and a veress needle was placed into the abdomen using standard drop technique. Pneumoperitoneum was then established. Using an optiview trocar a supra-umbilical robotic port was placed. No injury was noted at veress needle insertion site. Two 8-mm robotic ports were placed in the right and left lateral positions under direct visualization.    The [employer] platform was docked  onto the patient, the camera was inserted and targeted, and the instruments were placed under direct visualization.  Both inguinal regions were inspected for hernias and it was confirmed that the patient had bilateral innguinal hernia.  Using electocautery, the peritoneal and posterior rectus tissue flap was created.  The peritoneum on the left side was scored from the median umbilical ligament laterally towards the ASIS.  The flap was mobilized using robotic scissors and the bipolar instruments, creating a plane along the posterior rectus sheath and transversalis fascia down to the pubic tubercle medially. It was then further mobilized laterally across the inguinal canal and femoral vessels and onto the psoas muscle. The inferior epigastric vessels were identified and preserved. This created a posterior rectus and peritoneal flap measuring roughly 17 cm x 12 cm.  The hernia sac and contents were reduced preserving all structures. The patient had a left indirecthernia defect. The same dissection was carried out on the right side and the patient also had a right indirect hernia. On the left side a 3D Max large mesh was placed. This was tacked to the pubic tubercle using an absorbable tacker.  A large pro-grip polypropelene mesh was then inserted into the abdomen along with a 2-0 v-lock suture.  The pro-grip mesh was placed on the right side. The meshes overlapped medially and were placed into the pre-peritoneal space and there was good coverage of all hernia spaces. Then, the peritoneal flap was advanced over the mesh and carried over to close the defect. A running 2-0 V lock suture was used to approximate the edge of the flap onto the peritoneum.   All needles were removed under direct visualization.  The 8- mm ports were removed  under direct visualization and the Hasson trocar was removed.   Local anesthetic was infused in all incisions  and the incisions were closed with 4-0 Monocryl.  The wounds were cleaned  and sealed with DermaBond. The patient was emerged from anesthesia and extubated and brought to the recovery room for further management.  The patient tolerated the procedure well and all counts were correct at the end of the case.   Jayson Endow, M.D.

## 2023-11-29 NOTE — H&P (Signed)
 No changes to below H and P, proceed with robotic assisted bilateral inguinal hernia  CC: Bilateral Inguinal Hernia Repair History of Present Illness Gilbert Buchanan is a 53 y.o. male with past medical history as below who presents in consultation for bilateral inguinal hernias.  The patient reports that he started to notice pain in his bilateral groins and bulging in December.  He says since then he feels like it is gotten bigger and he is having more pain in his right groin.  He reports that this is similar to the pain he had when he had them repaired in the early 90s when he was in the [employer].  He denies any obstructive symptoms and denies any overlying skin changes.  He also has a varicocele that he has had repaired before.   Past Medical History     Past Medical History:  Diagnosis Date   Anxiety     Elevated blood pressure (not hypertension)     History of Lyme disease     Hyperlipidemia     Incomplete bladder emptying     Over weight     Prostatitis     Stones, prostate                   Past Surgical History:  Procedure Laterality Date   COLONOSCOPY WITH PROPOFOL  N/A 07/02/2021    Procedure: COLONOSCOPY WITH PROPOFOL ;  Surgeon: Therisa Bi, MD;  Location: [facility] ENDOSCOPY;  Service: Gastroenterology;  Laterality: N/A;   HERNIA REPAIR       VARICOCELE EXCISION              Allergies      Allergies  Allergen Reactions   Acyclovir And Related                Current Outpatient Medications  Medication Sig Dispense Refill   escitalopram  (LEXAPRO ) 10 MG tablet Take 1 tablet (10 mg total) by mouth daily. 90 tablet 3   fluticasone  (FLONASE ) 50 MCG/ACT nasal spray Place 2 sprays into both nostrils daily. 16 mL 0   rosuvastatin  (CRESTOR ) 10 MG tablet Take 1 tablet (10 mg total) by mouth daily. In place of pravastatin  90 tablet 3   tamsulosin  (FLOMAX ) 0.4 MG CAPS capsule Take 1 capsule (0.4 mg total) by mouth daily. 90 capsule 3   traZODone  (DESYREL ) 50 MG tablet Take 1 tablet  (50 mg total) by mouth at bedtime. 90 tablet 3      No current facility-administered medications for this visit.        Family History      Family History  Problem Relation Age of Onset   Kidney disease Mother     Testicular cancer Father     Polycystic kidney disease Other     Heart disease Maternal Grandmother     Stroke Paternal Grandmother     Stroke Paternal Grandfather     Prostate cancer Neg Hx              Social History Social History  Social History         Tobacco Use   Smoking status: Former      Current packs/day: 0.00      Average packs/day: 1 pack/day for 25.0 years (25.0 ttl pk-yrs)      Types: Cigarettes, Cigars      Start date: 06/09/1987      Quit date: 06/08/2012      Years since quitting: 11.4      Passive  exposure: Past   Smokeless tobacco: Former      Types: Snuff      Quit date: 05/07/2017   Tobacco comments:      N/A  Vaping Use   Vaping status: Former   Start date: 06/28/2014   Quit date: 06/08/2016  Substance Use Topics   Alcohol use: Yes      Comment: occasionally   Drug use: No            ROS Full ROS of systems performed and is otherwise negative there than what is stated in the HPI   Physical Exam Blood pressure 117/73, pulse 60, height 6' 2 (1.88 m), weight 207 lb (93.9 kg), SpO2 97%.   Alert and oriented x 3, normal work of breathing on room air, regular rate and rhythm, abdomen soft, nontender nondistended.  On his groin there are well-healed groin incisions.  On the right side there does seem to be an obvious defect and I can feel herniation on Valsalva.  There is a smaller defect on the left side without obvious bowel that herniates with Valsalva.   Data Reviewed I did review his CT scan that he had from 2011 and at that time there is no evidence of hernias in his groin.  His last labs from December his BMP is within normal limits.  At that time he had a hemoglobin A1c that was 5.3.   I have personally reviewed the  patient's imaging and medical records.     Assessment Assessment Patient with recurrent bilateral inguinal hernias on exam.  He had open repair with mesh in the early 90s.   Plan Plan Plan for robotic assisted bilateral inguinal hernia repair with mesh.  I discussed risk, benefits alternatives of the procedure including risk infection, bleeding, damage to the vas deferens, testicular ischemia, chronic pain and recurrence.  He understands these risks and wishes to proceed with surgery       Jayson MALVA Endow 11/11/2023, 3:25 PM

## 2023-11-29 NOTE — Anesthesia Procedure Notes (Signed)
 Procedure Name: Intubation Date/Time: 11/29/2023 7:41 AM  Performed by: Veronica Alm BROCKS, CRNAPre-anesthesia Checklist: Patient identified, Patient being monitored, Timeout performed, Emergency Drugs available and Suction available Patient Re-evaluated:Patient Re-evaluated prior to induction Oxygen Delivery Method: Circle system utilized Preoxygenation: Pre-oxygenation with 100% oxygen Induction Type: IV induction Ventilation: Mask ventilation without difficulty Laryngoscope Size: Mac and 3 Grade View: Grade I Tube type: Oral Tube size: 7.5 mm Number of attempts: 1 Airway Equipment and Method: Stylet Placement Confirmation: ETT inserted through vocal cords under direct vision, positive ETCO2 and breath sounds checked- equal and bilateral Secured at: 22 cm Tube secured with: Tape Dental Injury: Teeth and Oropharynx as per pre-operative assessment

## 2023-11-30 ENCOUNTER — Encounter: Payer: Self-pay | Admitting: General Surgery

## 2023-12-05 DIAGNOSIS — K4021 Bilateral inguinal hernia, without obstruction or gangrene, recurrent: Secondary | ICD-10-CM

## 2023-12-09 ENCOUNTER — Encounter: Admitting: General Surgery

## 2023-12-14 ENCOUNTER — Ambulatory Visit (INDEPENDENT_AMBULATORY_CARE_PROVIDER_SITE_OTHER): Admitting: General Surgery

## 2023-12-14 ENCOUNTER — Encounter: Payer: Self-pay | Admitting: General Surgery

## 2023-12-14 VITALS — BP 144/78 | HR 65 | Temp 98.4°F | Ht 74.0 in | Wt 206.0 lb

## 2023-12-14 DIAGNOSIS — K4021 Bilateral inguinal hernia, without obstruction or gangrene, recurrent: Secondary | ICD-10-CM

## 2023-12-14 DIAGNOSIS — Z09 Encounter for follow-up examination after completed treatment for conditions other than malignant neoplasm: Secondary | ICD-10-CM

## 2023-12-14 NOTE — Patient Instructions (Signed)

## 2023-12-14 NOTE — Progress Notes (Signed)
 Patient returns today status post robotic assisted bilateral inguinal hernia.  He reports doing well.  He says initially had a little bit of swelling but that has resolved.  He says that he had some pain initially at the incision sites and now is having some more soreness in his groin.  He is avoiding heavy lifting.  He denies any redness around his incisions.  He denies any bulges in his groins.  On exam his incisions are healing well without any erythema.  The surgical glue is still in there.  Bilaterally he has soft groins without any swelling.  There is no bulging on Valsalva.  Patient status post robotic assisted bilateral inguinal hernias.  Doing well without evidence of recurrence.  Discussed with him to continue lifting restrictions for the next month of no greater than 10 to 15 pounds.  He is okay to start submerging the wounds in water.  He can follow-up with us  as needed.

## 2024-01-04 DIAGNOSIS — N401 Enlarged prostate with lower urinary tract symptoms: Secondary | ICD-10-CM | POA: Diagnosis not present

## 2024-01-04 DIAGNOSIS — R3915 Urgency of urination: Secondary | ICD-10-CM | POA: Diagnosis not present

## 2024-01-04 DIAGNOSIS — I861 Scrotal varices: Secondary | ICD-10-CM | POA: Diagnosis not present

## 2024-01-04 DIAGNOSIS — R3912 Poor urinary stream: Secondary | ICD-10-CM | POA: Diagnosis not present

## 2024-01-10 DIAGNOSIS — N401 Enlarged prostate with lower urinary tract symptoms: Secondary | ICD-10-CM | POA: Diagnosis not present

## 2024-01-18 ENCOUNTER — Ambulatory Visit: Payer: BC Managed Care – PPO | Admitting: Family Medicine

## 2024-01-19 DIAGNOSIS — I861 Scrotal varices: Secondary | ICD-10-CM | POA: Diagnosis not present

## 2024-01-19 DIAGNOSIS — N50812 Left testicular pain: Secondary | ICD-10-CM | POA: Diagnosis not present

## 2024-01-24 ENCOUNTER — Ambulatory Visit: Admitting: Family Medicine

## 2024-01-24 ENCOUNTER — Encounter: Payer: Self-pay | Admitting: Family Medicine

## 2024-01-24 VITALS — BP 132/88 | HR 81 | Temp 97.9°F | Resp 16 | Ht 74.0 in | Wt 204.2 lb

## 2024-01-24 DIAGNOSIS — F411 Generalized anxiety disorder: Secondary | ICD-10-CM | POA: Diagnosis not present

## 2024-01-24 DIAGNOSIS — R251 Tremor, unspecified: Secondary | ICD-10-CM | POA: Diagnosis not present

## 2024-01-24 DIAGNOSIS — F341 Dysthymic disorder: Secondary | ICD-10-CM | POA: Diagnosis not present

## 2024-01-24 DIAGNOSIS — F4312 Post-traumatic stress disorder, chronic: Secondary | ICD-10-CM | POA: Diagnosis not present

## 2024-01-24 DIAGNOSIS — D708 Other neutropenia: Secondary | ICD-10-CM | POA: Diagnosis not present

## 2024-01-24 DIAGNOSIS — F4310 Post-traumatic stress disorder, unspecified: Secondary | ICD-10-CM | POA: Diagnosis not present

## 2024-01-24 MED ORDER — ATENOLOL 25 MG PO TABS: 12.5000 mg | ORAL_TABLET | Freq: Every evening | ORAL | 0 refills | Status: DC | PRN

## 2024-01-24 NOTE — Progress Notes (Signed)
 Name: Gilbert Buchanan   MRN: [id number]    DOB: 11-13-1970   Date:01/24/2024       Progress Note  Subjective  Chief Complaint  Chief Complaint  Patient presents with   Medical Management of Chronic Issues    Patient states he went off lexapro  medication a few weeks ago but states he is having trouble focusing and concentration issues since then.   Discussed the use of AI scribe software for clinical note transcription with the patient, who gave verbal consent to proceed.  History of Present Illness Gilbert Buchanan is a 53 year old male with generalized anxiety disorder who presents with worsening anxiety and cognitive symptoms following hernia surgery.  He has experienced a significant increase in anxiety and cognitive symptoms following hernia surgery on November 29, 2023. Symptoms include 'brain fog', visual disturbances such as seeing orbs, difficulty focusing, and tremors. He feels as though he is having a nervous breakdown and describes sensations akin to a panic attack.  He has a history of generalized anxiety disorder and obsessive-compulsive disorder for 30 years. He was on Lexapro  for about a year but discontinued it three weeks ago due to concerns it might be contributing to his symptoms. His symptoms improved slightly after stopping Lexapro , but he experienced a significant worsening over the past weekend.  He reports a history of OCD and has been able to manage stress in the past by planning and looking forward to future events, but currently, he feels unable to do so. He describes a lack of interest in activities and a general feeling of not being himself.  He has been taking Trazodone  as needed, usually on weekdays, and is on rosuvastatin  10 mg for cholesterol and Flonase  as needed. He previously took Wellbutrin  from 2016 to 2020 for smoking cessation and Buspar  before starting Lexapro .  No palpitations or significant weight changes. Reports difficulty with coordination, especially  in the mornings, and a lack of exercise due to post-surgical restrictions. Vision issues are exacerbated by changes in focus, and wearing contacts seems to help slightly, although he cannot use them at work due to difficulty seeing the computer screen.  Positive GAD and phq 9    Patient Active Problem List   Diagnosis Date Noted   Bilateral recurrent inguinal hernia without obstruction or gangrene 12/05/2023   Other neutropenia (HCC) 05/27/2022   Intermittent low back pain 05/27/2022   Insomnia 05/27/2022   COVID-19 01/31/2020   Noise-induced hearing loss of both ears 06/28/2017   Baker's cyst of knee, right 12/25/2016   Dyslipidemia 11/11/2015   Hyperglycemia 11/11/2015   Chronic constipation 11/11/2015   GAD (generalized anxiety disorder) 11/11/2015   Prostatitis 05/05/2015   BPH with obstruction/lower urinary tract symptoms 05/05/2015    Past Surgical History:  Procedure Laterality Date   COLONOSCOPY WITH PROPOFOL  N/A 07/02/2021   Procedure: COLONOSCOPY WITH PROPOFOL ;  Surgeon: Therisa Bi, MD;  Location: [facility] ENDOSCOPY;  Service: Gastroenterology;  Laterality: N/A;   HERNIA REPAIR     INSERTION OF MESH Bilateral 11/29/2023   Procedure: INSERTION OF MESH;  Surgeon: Marinda Jayson MALVA, MD;  Location: [facility] ORS;  Service: General;  Laterality: Bilateral;   REPAIR, HERNIA, INGUINAL, BILATERAL, ROBOT-ASSISTED Bilateral 11/29/2023   Procedure: REPAIR, HERNIA, INGUINAL, BILATERAL, ROBOT-ASSISTED;  Surgeon: Marinda Jayson MALVA, MD;  Location: [facility] ORS;  Service: General;  Laterality: Bilateral;   VARICOCELE EXCISION      Family History  Problem Relation Age of Onset   Kidney disease Mother  Testicular cancer Father    Polycystic kidney disease Other    Heart disease Maternal Grandmother    Stroke Paternal Grandmother    Stroke Paternal Grandfather    Prostate cancer Neg Hx     Social History   Tobacco Use   Smoking status: Former    Current packs/day: 0.00    Average packs/day:  1 pack/day for 25.0 years (25.0 ttl pk-yrs)    Types: Cigarettes, Cigars    Start date: 06/09/1987    Quit date: 06/08/2012    Years since quitting: 11.6    Passive exposure: Past   Smokeless tobacco: Former    Types: Snuff    Quit date: 05/07/2017   Tobacco comments:    Uses ZEN for nicotine  Substance Use Topics   Alcohol use: Yes    Comment: occasionally     Current Outpatient Medications:    fluticasone  (FLONASE ) 50 MCG/ACT nasal spray, Place 2 sprays into both nostrils daily. (Patient taking differently: Place 2 sprays into both nostrils daily as needed for allergies.), Disp: 16 mL, Rfl: 0   rosuvastatin  (CRESTOR ) 10 MG tablet, Take 1 tablet (10 mg total) by mouth daily. In place of pravastatin , Disp: 90 tablet, Rfl: 3   tamsulosin  (FLOMAX ) 0.4 MG CAPS capsule, Take 1 capsule (0.4 mg total) by mouth daily., Disp: 90 capsule, Rfl: 3   traZODone  (DESYREL ) 50 MG tablet, Take 1 tablet (50 mg total) by mouth at bedtime., Disp: 90 tablet, Rfl: 3  Allergies  Allergen Reactions   Acyclovir And Related     Forgets reaction     I personally reviewed active problem list, medication list, allergies, family history with the patient/caregiver today.   ROS  Ten systems reviewed and is negative except as mentioned in HPI    Objective Physical Exam CONSTITUTIONAL: Patient appears well-developed and well-nourished.  No distress. HEENT: Head atraumatic, normocephalic, neck supple. CARDIOVASCULAR: Normal rate, regular rhythm and normal heart sounds.  No murmur heard. No BLE edema. PULMONARY: Effort normal and breath sounds normal. No respiratory distress. ABDOMINAL: There is no tenderness or distention. MUSCULOSKELETAL: Normal gait. Without gross motor or sensory deficit. NEUROLOGICAL: negative Romberg, normal cranial nerves, normal grip, normal gait and sensation . Tremors of hands at rest  PSYCHIATRIC: Patient is anxious  Vitals:   01/24/24 1308  BP: 132/88  Pulse: 81  Resp: 16   Temp: 97.9 F (36.6 C)  TempSrc: Oral  SpO2: 99%  Weight: 204 lb 3.2 oz (92.6 kg)  Height: 6' 2 (1.88 m)    Body mass index is 26.22 kg/m.   PHQ2/9:    01/24/2024    1:07 PM 11/10/2023    1:38 PM 07/21/2023   11:25 AM 05/18/2023    8:26 AM 10/13/2022    2:27 PM  Depression screen PHQ 2/9  Decreased Interest 2 0 0 0 2  Down, Depressed, Hopeless 2 0 0 0 0  PHQ - 2 Score 4 0 0 0 2  Altered sleeping 2  0 0 0  Tired, decreased energy 2  0 0 0  Change in appetite 0  0 0 0  Feeling bad or failure about yourself  0  0 0 0  Trouble concentrating 2  0 0 0  Moving slowly or fidgety/restless 0  0 0 0  Suicidal thoughts 0  0 0 0  PHQ-9 Score 10  0 0 2  Difficult doing work/chores Very difficult  Not difficult at all Not difficult at all Somewhat difficult  phq 9 is negative     01/24/2024    1:11 PM 07/21/2023   11:25 AM 10/13/2022    2:27 PM 10/25/2019    4:02 PM  GAD 7 : Generalized Anxiety Score  Nervous, Anxious, on Edge 1 0 1 1  Control/stop worrying 1 0 1 2  Worry too much - different things 1 0 1 2  Trouble relaxing 1 0 1 2  Restless 1 0 1 3  Easily annoyed or irritable 1 0 1 3  Afraid - awful might happen 1 0 1 2  Total GAD 7 Score 7 0 7 15  Anxiety Difficulty Very difficult Not difficult at all Somewhat difficult       Fall Risk:    01/24/2024    1:07 PM 11/11/2023    2:48 PM 11/10/2023    1:38 PM 07/21/2023   11:25 AM 05/18/2023    8:26 AM  Fall Risk   Falls in the past year? 0 0 0 0 0  Number falls in past yr: 0 0 0 0 0  Injury with Fall? 0 0 0 0 0  Risk for fall due to : No Fall Risks  No Fall Risks No Fall Risks No Fall Risks  Follow up Falls evaluation completed  Falls prevention discussed;Education provided;Falls evaluation completed Falls prevention discussed;Education provided;Falls evaluation completed Falls prevention discussed;Education provided;Falls evaluation completed     Assessment & Plan Generalized anxiety disorder with panic attacks and  depressive symptoms Exacerbation post-surgery with high anxiety, panic attacks, and elevated PHQ-9 score indicating significant depressive symptoms. Anxiety may contribute to elevated blood pressure. Recent discontinuation of Lexapro  due to adverse effects. - Order genetic testing for medication compatibility. - Prescribe atenolol  to manage anxiety symptoms and slightly elevated blood pressure since he is not willing to try anything else before gene testing - Perform neurologic examination. - Order blood work including thyroid  panel and CBC.  Obsessive-compulsive disorder - stable  Tremor Recent onset, particularly in the morning, potentially related to anxiety or other underlying conditions. Further evaluation needed. - Include tremors in neurologic examination. - check TSH  Visual disturbance with difficulty focusing and brain fog Symptoms include inability to focus vision, brain fog, and coordination issues, particularly post-surgery.  Possibly from anxiety/dysthymia. May need to see neurologist

## 2024-01-25 ENCOUNTER — Ambulatory Visit: Payer: Self-pay | Admitting: Family Medicine

## 2024-01-25 LAB — CBC WITH DIFFERENTIAL/PLATELET
Absolute Lymphocytes: 1422 {cells}/uL (ref 850–3900)
Absolute Monocytes: 269 {cells}/uL (ref 200–950)
Basophils Absolute: 22 {cells}/uL (ref 0–200)
Basophils Relative: 0.4 %
Eosinophils Absolute: 11 {cells}/uL — ABNORMAL LOW (ref 15–500)
Eosinophils Relative: 0.2 %
HCT: 44.8 % (ref 38.5–50.0)
Hemoglobin: 15.6 g/dL (ref 13.2–17.1)
MCH: 33.3 pg — ABNORMAL HIGH (ref 27.0–33.0)
MCHC: 34.8 g/dL (ref 32.0–36.0)
MCV: 95.7 fL (ref 80.0–100.0)
MPV: 10.7 fL (ref 7.5–12.5)
Monocytes Relative: 4.8 %
Neutro Abs: 3875 {cells}/uL (ref 1500–7800)
Neutrophils Relative %: 69.2 %
Platelets: 190 Thousand/uL (ref 140–400)
RBC: 4.68 Million/uL (ref 4.20–5.80)
RDW: 11.9 % (ref 11.0–15.0)
Total Lymphocyte: 25.4 %
WBC: 5.6 Thousand/uL (ref 3.8–10.8)

## 2024-01-25 LAB — COMPREHENSIVE METABOLIC PANEL WITH GFR
AG Ratio: 2.3 (calc) (ref 1.0–2.5)
ALT: 39 U/L (ref 9–46)
AST: 29 U/L (ref 10–35)
Albumin: 4.5 g/dL (ref 3.6–5.1)
Alkaline phosphatase (APISO): 41 U/L (ref 35–144)
BUN: 13 mg/dL (ref 7–25)
CO2: 29 mmol/L (ref 20–32)
Calcium: 9.6 mg/dL (ref 8.6–10.3)
Chloride: 105 mmol/L (ref 98–110)
Creat: 0.91 mg/dL (ref 0.70–1.30)
Globulin: 2 g/dL (ref 1.9–3.7)
Glucose, Bld: 118 mg/dL — ABNORMAL HIGH (ref 65–99)
Potassium: 4.2 mmol/L (ref 3.5–5.3)
Sodium: 142 mmol/L (ref 135–146)
Total Bilirubin: 0.7 mg/dL (ref 0.2–1.2)
Total Protein: 6.5 g/dL (ref 6.1–8.1)
eGFR: 102 mL/min/1.73m2 (ref >=60)

## 2024-01-25 LAB — TSH: TSH: 1.12 m[IU]/L (ref 0.40–4.50)

## 2024-01-27 DIAGNOSIS — R3915 Urgency of urination: Secondary | ICD-10-CM | POA: Diagnosis not present

## 2024-02-08 ENCOUNTER — Encounter: Payer: Self-pay | Admitting: Family Medicine

## 2024-02-08 ENCOUNTER — Ambulatory Visit: Admitting: Family Medicine

## 2024-02-08 VITALS — BP 126/78 | HR 70 | Resp 16 | Ht 74.0 in | Wt 205.6 lb

## 2024-02-08 DIAGNOSIS — F411 Generalized anxiety disorder: Secondary | ICD-10-CM | POA: Diagnosis not present

## 2024-02-08 DIAGNOSIS — F341 Dysthymic disorder: Secondary | ICD-10-CM

## 2024-02-08 DIAGNOSIS — R7303 Prediabetes: Secondary | ICD-10-CM | POA: Diagnosis not present

## 2024-02-08 MED ORDER — HYDROXYZINE HCL 10 MG PO TABS: 10.0000 mg | ORAL_TABLET | Freq: Three times a day (TID) | ORAL | 0 refills | Status: DC | PRN

## 2024-02-08 MED ORDER — BUSPIRONE HCL 5 MG PO TABS: 5.0000 mg | ORAL_TABLET | Freq: Two times a day (BID) | ORAL | 0 refills | Status: DC

## 2024-02-08 NOTE — Progress Notes (Signed)
 Name: Gilbert Buchanan   MRN: [id number]    DOB: 10-Jun-1970   Date:02/08/2024       Progress Note  Subjective  Chief Complaint  Chief Complaint  Patient presents with   Results    Discuss Genesight results and pt states atenolol  not helping with GAD   Discussed the use of AI scribe software for clinical note transcription with the patient, who gave verbal consent to proceed.  History of Present Illness Gilbert Buchanan is a 53 year old male who presents with anxiety and concerns about his health following recent surgery and medication changes.  He experiences significant anxiety and difficulty managing his thoughts, particularly after stopping his antidepressant medication following surgery. He feels anxious and tends to overthink, leading to episodes where he feels compelled to drink alcohol to manage his anxiety. Engaging in activities like fishing helps distract him from his anxious thoughts.  He experiences physical symptoms such as numbness in his hands, blurry vision, and slow healing of cuts, which he associates with concerns about potential diabetes. His glucose readings have been 102, 109, and 118 mg/dL, and his J8r was 4.6% last year. 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  He is currently taking trazodone  for sleep, rosuvastatin  for cholesterol, and atenolol  at night. He previously took Lexapro  but stopped it after surgery. He uses oral nicotine daily but does not smoke.  He has a history of surgery and has stopped exercising, which he believes has contributed to his current state. He is also contemplating retirement, which he acknowledges is causing underlying stress and anxiety.    Patient Active Problem List   Diagnosis Date Noted   Bilateral recurrent inguinal hernia without obstruction or gangrene 12/05/2023   Other neutropenia (HCC) 05/27/2022   Intermittent low  back pain 05/27/2022   Insomnia 05/27/2022   COVID-19 01/31/2020   Noise-induced hearing loss of both ears 06/28/2017   Baker's cyst of knee, right 12/25/2016   Dyslipidemia 11/11/2015   Hyperglycemia 11/11/2015   Chronic constipation 11/11/2015   GAD (generalized anxiety disorder) 11/11/2015   Prostatitis 05/05/2015   BPH with obstruction/lower urinary tract symptoms 05/05/2015    Past Surgical History:  Procedure Laterality Date   COLONOSCOPY WITH PROPOFOL  N/A 07/02/2021   Procedure: COLONOSCOPY WITH PROPOFOL ;  Surgeon: Therisa Bi, MD;  Location: [facility] ENDOSCOPY;  Service: Gastroenterology;  Laterality: N/A;   HERNIA REPAIR     INSERTION OF MESH Bilateral 11/29/2023   Procedure: INSERTION OF MESH;  Surgeon: Marinda Jayson MALVA, MD;  Location: [facility] ORS;  Service: General;  Laterality: Bilateral;   REPAIR, HERNIA, INGUINAL, BILATERAL, ROBOT-ASSISTED Bilateral 11/29/2023   Procedure: REPAIR, HERNIA, INGUINAL, BILATERAL, ROBOT-ASSISTED;  Surgeon: Marinda Jayson MALVA, MD;  Location: [facility] ORS;  Service: General;  Laterality: Bilateral;   VARICOCELE EXCISION      Family History  Problem Relation Age of Onset   Kidney disease Mother    Testicular cancer Father    Polycystic kidney disease Other    Heart disease Maternal Grandmother    Stroke Paternal Grandmother    Stroke Paternal Grandfather    Prostate cancer Neg Hx     Social History   Tobacco Use   Smoking status: Former    Current packs/day: 0.00    Average packs/day: 1 pack/day for 25.0 years (25.0 ttl pk-yrs)    Types: Cigarettes, Cigars    Start date: 06/09/1987  Quit date: 06/08/2012    Years since quitting: 11.6    Passive exposure: Past   Smokeless tobacco: Former    Types: Snuff    Quit date: 05/07/2017   Tobacco comments:    Uses ZEN for nicotine  Substance Use Topics   Alcohol use: Yes    Comment: occasionally     Current Outpatient Medications:    atenolol  (TENORMIN ) 25 MG tablet, Take 0.5-1 tablets (12.5-25  mg total) by mouth at bedtime as needed., Disp: 30 tablet, Rfl: 0   fluticasone  (FLONASE ) 50 MCG/ACT nasal spray, Place 2 sprays into both nostrils daily. (Patient taking differently: Place 2 sprays into both nostrils daily as needed for allergies.), Disp: 16 mL, Rfl: 0   rosuvastatin  (CRESTOR ) 10 MG tablet, Take 1 tablet (10 mg total) by mouth daily. In place of pravastatin , Disp: 90 tablet, Rfl: 3   tamsulosin  (FLOMAX ) 0.4 MG CAPS capsule, Take 1 capsule (0.4 mg total) by mouth daily., Disp: 90 capsule, Rfl: 3   traZODone  (DESYREL ) 50 MG tablet, Take 1 tablet (50 mg total) by mouth at bedtime., Disp: 90 tablet, Rfl: 3  Allergies  Allergen Reactions   Acyclovir And Related     Forgets reaction     I personally reviewed active problem list, medication list, allergies, family history with the patient/caregiver today.   ROS  Ten systems reviewed and is negative except as mentioned in HPI    Objective Physical Exam CONSTITUTIONAL: Patient appears well-developed and well-nourished.  No distress. HEENT: Head atraumatic, normocephalic, neck supple. CARDIOVASCULAR: Normal rate, regular rhythm and normal heart sounds.  No murmur heard. No BLE edema. PULMONARY: Effort normal and breath sounds normal. No respiratory distress. MUSCULOSKELETAL: Normal gait. Without gross motor or sensory deficit. PSYCHIATRIC: Patient seems anxious   Vitals:   02/08/24 1410  BP: 126/78  Pulse: 70  Resp: 16  SpO2: 99%  Weight: 205 lb 9.6 oz (93.3 kg)  Height: 6' 2 (1.88 m)    Body mass index is 26.4 kg/m.  Recent Results (from the past 2160 hours)  CBC with Differential/Platelet     Status: Abnormal   Collection Time: 01/24/24 12:00 AM  Result Value Ref Range   WBC 5.6 3.8 - 10.8 Thousand/uL   RBC 4.68 4.20 - 5.80 Million/uL   Hemoglobin 15.6 13.2 - 17.1 g/dL   HCT 55.1 61.4 - 49.9 %   MCV 95.7 80.0 - 100.0 fL   MCH 33.3 (H) 27.0 - 33.0 pg   MCHC 34.8 32.0 - 36.0 g/dL    Comment: For adults, a  slight decrease in the calculated MCHC value (in the range of 30 to 32 g/dL) is most likely not clinically significant; however, it should be interpreted with caution in correlation with other red cell parameters and the patient's clinical condition.    RDW 11.9 11.0 - 15.0 %   Platelets 190 140 - 400 Thousand/uL   MPV 10.7 7.5 - 12.5 fL   Neutro Abs 3,875 1,500 - 7,800 cells/uL   Absolute Lymphocytes 1,422 850 - 3,900 cells/uL   Absolute Monocytes 269 200 - 950 cells/uL   Eosinophils Absolute 11 (L) 15 - 500 cells/uL   Basophils Absolute 22 0 - 200 cells/uL   Neutrophils Relative % 69.2 %   Total Lymphocyte 25.4 %   Monocytes Relative 4.8 %   Eosinophils Relative 0.2 %   Basophils Relative 0.4 %  Comprehensive metabolic panel with GFR     Status: Abnormal   Collection Time: 01/24/24 12:00  AM  Result Value Ref Range   Glucose, Bld 118 (H) 65 - 99 mg/dL    Comment: .            Fasting reference interval . For someone without known diabetes, a glucose value between 100 and 125 mg/dL is consistent with prediabetes and should be confirmed with a follow-up test. .    BUN 13 7 - 25 mg/dL   Creat 9.08 9.29 - 8.69 mg/dL   eGFR 897 > OR = 60 fO/fpw/8.26f7   BUN/Creatinine Ratio SEE NOTE: 6 - 22 (calc)    Comment:    Not Reported: BUN and Creatinine are within    reference range. .    Sodium 142 135 - 146 mmol/L   Potassium 4.2 3.5 - 5.3 mmol/L   Chloride 105 98 - 110 mmol/L   CO2 29 20 - 32 mmol/L   Calcium  9.6 8.6 - 10.3 mg/dL   Total Protein 6.5 6.1 - 8.1 g/dL   Albumin 4.5 3.6 - 5.1 g/dL   Globulin 2.0 1.9 - 3.7 g/dL (calc)   AG Ratio 2.3 1.0 - 2.5 (calc)   Total Bilirubin 0.7 0.2 - 1.2 mg/dL   Alkaline phosphatase (APISO) 41 35 - 144 U/L   AST 29 10 - 35 U/L   ALT 39 9 - 46 U/L  TSH     Status: None   Collection Time: 01/24/24 12:00 AM  Result Value Ref Range   TSH 1.12 0.40 - 4.50 mIU/L    Diabetic Foot Exam:     PHQ2/9:    02/08/2024    2:09 PM 01/24/2024     1:07 PM 11/10/2023    1:38 PM 07/21/2023   11:25 AM 05/18/2023    8:26 AM  Depression screen PHQ 2/9  Decreased Interest 3 2 0 0 0  Down, Depressed, Hopeless 3 2 0 0 0  PHQ - 2 Score 6 4 0 0 0  Altered sleeping 3 2  0 0  Tired, decreased energy 3 2  0 0  Change in appetite 3 0  0 0  Feeling bad or failure about yourself  0 0  0 0  Trouble concentrating 3 2  0 0  Moving slowly or fidgety/restless 0 0  0 0  Suicidal thoughts 0 0  0 0  PHQ-9 Score 18 10  0 0  Difficult doing work/chores Very difficult Very difficult  Not difficult at all Not difficult at all    phq 9 is positive     02/08/2024    2:09 PM 01/24/2024    1:11 PM 07/21/2023   11:25 AM 10/13/2022    2:27 PM  GAD 7 : Generalized Anxiety Score  Nervous, Anxious, on Edge 3 1 0 1  Control/stop worrying 3 1 0 1  Worry too much - different things 3 1 0 1  Trouble relaxing 3 1 0 1  Restless 3 1 0 1  Easily annoyed or irritable 3 1 0 1  Afraid - awful might happen 3 1 0 1  Total GAD 7 Score 21 7 0 7  Anxiety Difficulty Extremely difficult Very difficult Not difficult at all Somewhat difficult      Fall Risk:    02/08/2024    2:07 PM 01/24/2024    1:07 PM 11/11/2023    2:48 PM 11/10/2023    1:38 PM 07/21/2023   11:25 AM  Fall Risk   Falls in the past year? 0 0 0 0 0  Number falls in past yr: 0 0 0 0 0  Injury with Fall? 0 0 0 0 0  Risk for fall due to : No Fall Risks No Fall Risks  No Fall Risks No Fall Risks  Follow up Falls evaluation completed Falls evaluation completed  Falls prevention discussed;Education provided;Falls evaluation completed Falls prevention discussed;Education provided;Falls evaluation completed     Assessment and Plan Assessment & Plan Generalized anxiety disorder Anxiety symptoms worsened by recent surgery, medication cessation, and upcoming retirement. Lexapro  showed moderate gene-drug interaction. Hydroxyzine  and Buspar  discussed as treatment options. Patient prefers as-needed medication. -  Prescribed hydroxyzine  for as-needed use, advised starting on weekends or evenings. - Prescribed Buspar  for as-needed use up to twice daily, particularly during the day. - Consider switching to Buspar  if hydroxyzine  is ineffective or intolerable. - Discussed GenSight results : Pristiq, Fezima, Emsam and Viibryd best options for him - Follow-up in one month to evaluate treatment efficacy.  Prediabetes Glucose levels slightly elevated with normal A1c. Symptoms not attributed to glucose levels. Lifestyle factors may influence glucose fluctuations.  Hyperlipidemia Managed with rosuvastatin .

## 2024-02-11 ENCOUNTER — Encounter: Payer: Self-pay | Admitting: Nurse Practitioner

## 2024-02-21 ENCOUNTER — Ambulatory Visit: Admitting: Family Medicine

## 2024-03-14 ENCOUNTER — Other Ambulatory Visit: Payer: Self-pay | Admitting: Family Medicine

## 2024-03-14 DIAGNOSIS — F411 Generalized anxiety disorder: Secondary | ICD-10-CM

## 2024-03-16 ENCOUNTER — Ambulatory Visit: Admitting: Family Medicine

## 2024-03-16 ENCOUNTER — Encounter: Payer: Self-pay | Admitting: Family Medicine

## 2024-03-16 VITALS — BP 122/78 | HR 72 | Resp 16 | Ht 74.0 in | Wt 202.4 lb

## 2024-03-16 DIAGNOSIS — F341 Dysthymic disorder: Secondary | ICD-10-CM

## 2024-03-16 DIAGNOSIS — F411 Generalized anxiety disorder: Secondary | ICD-10-CM | POA: Diagnosis not present

## 2024-03-16 DIAGNOSIS — Z23 Encounter for immunization: Secondary | ICD-10-CM | POA: Diagnosis not present

## 2024-03-16 DIAGNOSIS — N401 Enlarged prostate with lower urinary tract symptoms: Secondary | ICD-10-CM

## 2024-03-16 DIAGNOSIS — N138 Other obstructive and reflux uropathy: Secondary | ICD-10-CM

## 2024-03-16 MED ORDER — BUSPIRONE HCL 5 MG PO TABS
5.0000 mg | ORAL_TABLET | Freq: Two times a day (BID) | ORAL | 0 refills | Status: AC
Start: 2024-03-16 — End: ?

## 2024-03-16 MED ORDER — DESVENLAFAXINE SUCCINATE ER 50 MG PO TB24: 50.0000 mg | ORAL_TABLET | Freq: Every day | ORAL | 0 refills | Status: DC

## 2024-03-16 NOTE — Progress Notes (Signed)
 Name: Gilbert Buchanan   MRN: [id number]    DOB: [birth year]   Date:03/16/2024       Progress Note  Subjective  Chief Complaint  Chief Complaint  Patient presents with   Medical Management of Chronic Issues    Pt stopped Hydroxyzine  but continued BUSPAR  and has noticed some improvement   Discussed the use of AI scribe software for clinical note transcription with the patient, who gave verbal consent to proceed.  History of Present Illness Gilbert Buchanan is a 53 year old male who presents for a follow-up regarding anxiety management.  He has been taking Buspar  regularly, initially twice a day, but has tapered to using it primarily in the mornings and as needed for anxiety. Buspar  helps manage his anxiety, although it does not alleviate all symptoms, particularly during high-stress situations. He prefers Buspar  over hydroxyzine , which he felt had no effect.  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 He appreciates the flexibility of Buspar , which he can take as needed without impacting his license.  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 After three days in such an environment, he often does not feel the need to take any medication.  He describes a tendency to become impatient and frustrated with physical objects, such as tangled cords or malfunctioning phones, which is not alleviated by Buspar . He states anxiety is still present and is willing to try SSRI for that, based on GenSight Testing Pristiq and Viibrid are good options   He has a history of hernia surgery and has undergone urological evaluation for prostate and variceal problems. He has undergone extensive testing at a urology clinic, which he describes as inefficient and unsatisfactory. He is awaiting a final review of his prostate  condition.  He reports a significant family history of polycystic kidney disease, which has affected several relatives, leading to severe health complications and decisions regarding end-of-life care.    Patient Active Problem List   Diagnosis Date Noted   Bilateral recurrent inguinal hernia without obstruction or gangrene 12/05/2023   Other neutropenia 05/27/2022   Intermittent low back pain 05/27/2022   Insomnia 05/27/2022   COVID-19 01/31/2020   Noise-induced hearing loss of both ears 06/28/2017   Baker's cyst of knee, right 12/25/2016   Dyslipidemia 11/11/2015   Hyperglycemia 11/11/2015   Chronic constipation 11/11/2015   GAD (generalized anxiety disorder) 11/11/2015   Prostatitis 05/05/2015   BPH with obstruction/lower urinary tract symptoms 05/05/2015    Past Surgical History:  Procedure Laterality Date   COLONOSCOPY WITH PROPOFOL  N/A 07/02/2021   Procedure: COLONOSCOPY WITH PROPOFOL ;  Surgeon: Therisa Bi, MD;  Location: [facility] ENDOSCOPY;  Service: Gastroenterology;  Laterality: N/A;   HERNIA REPAIR     INSERTION OF MESH Bilateral 11/29/2023   Procedure: INSERTION OF MESH;  Surgeon: Marinda Jayson MALVA, MD;  Location: [facility] ORS;  Service: General;  Laterality: Bilateral;   REPAIR, HERNIA, INGUINAL, BILATERAL, ROBOT-ASSISTED Bilateral 11/29/2023   Procedure: REPAIR, HERNIA, INGUINAL, BILATERAL, ROBOT-ASSISTED;  Surgeon: Marinda Jayson MALVA, MD;  Location: [facility] ORS;  Service: General;  Laterality: Bilateral;   VARICOCELE EXCISION      Family History  Problem Relation Age of Onset   Kidney disease Mother    Testicular cancer Father    Polycystic kidney disease Other    Heart disease Maternal Grandmother    Stroke  Paternal Grandmother    Stroke Paternal Grandfather    Prostate cancer Neg Hx     Social History   Tobacco Use   Smoking status: Former    Current packs/day: 0.00    Average packs/day: 1 pack/day for 25.0 years (25.0 ttl pk-yrs)    Types: Cigarettes, Cigars     Start date: 06/09/1987    Quit date: 06/08/2012    Years since quitting: 11.7    Passive exposure: Past   Smokeless tobacco: Former    Types: Snuff    Quit date: 05/07/2017   Tobacco comments:    Uses ZEN for nicotine  Substance Use Topics   Alcohol use: Yes    Comment: occasionally     Current Outpatient Medications:    atenolol  (TENORMIN ) 25 MG tablet, Take 0.5-1 tablets (12.5-25 mg total) by mouth at bedtime as needed., Disp: 30 tablet, Rfl: 0   busPIRone  (BUSPAR ) 5 MG tablet, TAKE 1 TABLET(5 MG) BY MOUTH TWICE DAILY, Disp: 60 tablet, Rfl: 0   fluticasone  (FLONASE ) 50 MCG/ACT nasal spray, Place 2 sprays into both nostrils daily. (Patient taking differently: Place 2 sprays into both nostrils daily as needed for allergies.), Disp: 16 mL, Rfl: 0   rosuvastatin  (CRESTOR ) 10 MG tablet, Take 1 tablet (10 mg total) by mouth daily. In place of pravastatin , Disp: 90 tablet, Rfl: 3   tamsulosin  (FLOMAX ) 0.4 MG CAPS capsule, Take 1 capsule (0.4 mg total) by mouth daily., Disp: 90 capsule, Rfl: 3   traZODone  (DESYREL ) 50 MG tablet, Take 1 tablet (50 mg total) by mouth at bedtime., Disp: 90 tablet, Rfl: 3   hydrOXYzine  (ATARAX ) 10 MG tablet, Take 1 tablet (10 mg total) by mouth 3 (three) times daily as needed. (Patient not taking: Reported on 03/16/2024), Disp: 90 tablet, Rfl: 0  Allergies  Allergen Reactions   Acyclovir And Related     Forgets reaction     I personally reviewed active problem list, medication list, allergies, family history with the patient/caregiver today.   ROS  Ten systems reviewed and is negative except as mentioned in HPI    Objective Physical Exam CONSTITUTIONAL: Patient appears well-developed and well-nourished.  No distress. HEENT: Head atraumatic, normocephalic, neck supple. CARDIOVASCULAR: Normal rate, regular rhythm and normal heart sounds.  No murmur heard. No BLE edema. PULMONARY: Effort normal and breath sounds normal. No respiratory distress. ABDOMINAL:  There is no tenderness or distention. MUSCULOSKELETAL: Normal gait. Without gross motor or sensory deficit. PSYCHIATRIC: Patient has a normal mood and affect. behavior is normal. Judgment and thought content normal.  Vitals:   03/16/24 0902  BP: 122/78  Pulse: 72  Resp: 16  SpO2: 97%  Weight: 202 lb 6.4 oz (91.8 kg)  Height: 6' 2 (1.88 m)    Body mass index is 25.99 kg/m.  Recent Results (from the past 2160 hours)  CBC with Differential/Platelet     Status: Abnormal   Collection Time: 01/24/24 12:00 AM  Result Value Ref Range   WBC 5.6 3.8 - 10.8 Thousand/uL   RBC 4.68 4.20 - 5.80 Million/uL   Hemoglobin 15.6 13.2 - 17.1 g/dL   HCT 55.1 61.4 - 49.9 %   MCV 95.7 80.0 - 100.0 fL   MCH 33.3 (H) 27.0 - 33.0 pg   MCHC 34.8 32.0 - 36.0 g/dL    Comment: For adults, a slight decrease in the calculated MCHC value (in the range of 30 to 32 g/dL) is most likely not clinically significant; however, it should be interpreted  with caution in correlation with other red cell parameters and the patient's clinical condition.    RDW 11.9 11.0 - 15.0 %   Platelets 190 140 - 400 Thousand/uL   MPV 10.7 7.5 - 12.5 fL   Neutro Abs 3,875 1,500 - 7,800 cells/uL   Absolute Lymphocytes 1,422 850 - 3,900 cells/uL   Absolute Monocytes 269 200 - 950 cells/uL   Eosinophils Absolute 11 (L) 15 - 500 cells/uL   Basophils Absolute 22 0 - 200 cells/uL   Neutrophils Relative % 69.2 %   Total Lymphocyte 25.4 %   Monocytes Relative 4.8 %   Eosinophils Relative 0.2 %   Basophils Relative 0.4 %  Comprehensive metabolic panel with GFR     Status: Abnormal   Collection Time: 01/24/24 12:00 AM  Result Value Ref Range   Glucose, Bld 118 (H) 65 - 99 mg/dL    Comment: .            Fasting reference interval . For someone without known diabetes, a glucose value between 100 and 125 mg/dL is consistent with prediabetes and should be confirmed with a follow-up test. .    BUN 13 7 - 25 mg/dL   Creat 9.08  9.29 - 8.69 mg/dL   eGFR 897 > OR = 60 fO/fpw/8.26f7   BUN/Creatinine Ratio SEE NOTE: 6 - 22 (calc)    Comment:    Not Reported: BUN and Creatinine are within    reference range. .    Sodium 142 135 - 146 mmol/L   Potassium 4.2 3.5 - 5.3 mmol/L   Chloride 105 98 - 110 mmol/L   CO2 29 20 - 32 mmol/L   Calcium  9.6 8.6 - 10.3 mg/dL   Total Protein 6.5 6.1 - 8.1 g/dL   Albumin 4.5 3.6 - 5.1 g/dL   Globulin 2.0 1.9 - 3.7 g/dL (calc)   AG Ratio 2.3 1.0 - 2.5 (calc)   Total Bilirubin 0.7 0.2 - 1.2 mg/dL   Alkaline phosphatase (APISO) 41 35 - 144 U/L   AST 29 10 - 35 U/L   ALT 39 9 - 46 U/L  TSH     Status: None   Collection Time: 01/24/24 12:00 AM  Result Value Ref Range   TSH 1.12 0.40 - 4.50 mIU/L    Diabetic Foot Exam:     PHQ2/9:    03/16/2024    9:02 AM 02/08/2024    2:09 PM 01/24/2024    1:07 PM 11/10/2023    1:38 PM 07/21/2023   11:25 AM  Depression screen PHQ 2/9  Decreased Interest 0 3 2 0 0  Down, Depressed, Hopeless 0 3 2 0 0  PHQ - 2 Score 0 6 4 0 0  Altered sleeping 0 3 2  0  Tired, decreased energy 0 3 2  0  Change in appetite 0 3 0  0  Feeling bad or failure about yourself  0 0 0  0  Trouble concentrating 2 3 2   0  Moving slowly or fidgety/restless 0 0 0  0  Suicidal thoughts 0 0 0  0  PHQ-9 Score 2 18 10   0  Difficult doing work/chores Not difficult at all Very difficult Very difficult  Not difficult at all    phq 9 is negative     03/16/2024    9:03 AM 02/08/2024    2:09 PM 01/24/2024    1:11 PM 07/21/2023   11:25 AM  GAD 7 : Generalized Anxiety Score  Nervous,  Anxious, on Edge 3 3 1  0  Control/stop worrying 2 3 1  0  Worry too much - different things 3 3 1  0  Trouble relaxing 3 3 1  0  Restless 2 3 1  0  Easily annoyed or irritable 3 3 1  0  Afraid - awful might happen 0 3 1 0  Total GAD 7 Score 16 21 7  0  Anxiety Difficulty Very difficult Extremely difficult Very difficult Not difficult at all      Fall Risk:    03/16/2024    8:58 AM 02/08/2024     2:07 PM 01/24/2024    1:07 PM 11/11/2023    2:48 PM 11/10/2023    1:38 PM  Fall Risk   Falls in the past year? 0 0 0 0 0  Number falls in past yr: 0 0 0 0 0  Injury with Fall? 0 0 0 0 0  Risk for fall due to : No Fall Risks No Fall Risks No Fall Risks  No Fall Risks  Follow up Falls evaluation completed Falls evaluation completed Falls evaluation completed  Falls prevention discussed;Education provided;Falls evaluation completed      Assessment & Plan Generalized anxiety disorder Symptoms of anxiety, particularly work-related. Buspar  effective but not fully controlling symptoms. Hydroxyzine  ineffective. Prefers Buspar  for dosing flexibility. Considering Pristiq based on GenSight  testing. 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 - Prescribed Pristiq 50 mg - 30 pills, assess effectiveness in one month. - Continue Buspar  as needed for anxiety. - Provide 30-day supply of Buspar  to align with Pristiq. - Discussed potential side effects of Pristiq, including GI upset and nausea. - Evaluate Pristiq effectiveness in one month, provide refills if effective.  Major depressive disorder, recurrent episodes  Symptoms improved significantly, PHQ-9 score reduced from 18 to 2. Focus on managing anxiety symptoms.  Benign prostatic hyperplasia with lower urinary tract symptoms Ongoing evaluation by urology. Dissatisfaction with current care due to poor communication and inefficiency. Unnecessary test repetition causing frustration. - Advise continuation with current urology follow-up for final review.

## 2024-04-03 DIAGNOSIS — D2272 Melanocytic nevi of left lower limb, including hip: Secondary | ICD-10-CM | POA: Diagnosis not present

## 2024-04-03 DIAGNOSIS — D2262 Melanocytic nevi of left upper limb, including shoulder: Secondary | ICD-10-CM | POA: Diagnosis not present

## 2024-04-03 DIAGNOSIS — D2261 Melanocytic nevi of right upper limb, including shoulder: Secondary | ICD-10-CM | POA: Diagnosis not present

## 2024-04-03 DIAGNOSIS — D225 Melanocytic nevi of trunk: Secondary | ICD-10-CM | POA: Diagnosis not present

## 2024-04-06 ENCOUNTER — Other Ambulatory Visit: Payer: Self-pay | Admitting: Family Medicine

## 2024-04-06 DIAGNOSIS — F341 Dysthymic disorder: Secondary | ICD-10-CM

## 2024-04-06 DIAGNOSIS — F411 Generalized anxiety disorder: Secondary | ICD-10-CM

## 2024-05-01 DIAGNOSIS — N50812 Left testicular pain: Secondary | ICD-10-CM | POA: Diagnosis not present

## 2024-05-17 NOTE — Patient Instructions (Signed)
 Preventive Care 25-52 Years Old Old, Male Preventive care refers to lifestyle choices and visits with your health care provider that can promote health and wellness. Preventive care visits are also called wellness exams. What can I expect for my preventive care visit? Counseling During your preventive care visit, your health care provider may ask about your: Medical history, including: Past medical problems. Family medical history. Current health, including: Emotional well-being. Home life and relationship well-being. Sexual activity. Lifestyle, including: Alcohol, nicotine or tobacco, and drug use. Access to firearms. Diet, exercise, and sleep habits. Safety issues such as seatbelt and bike helmet use. Sunscreen use. Work and work Astronomer. Physical exam Your health care provider will check your: Height and weight. These may be used to calculate your BMI (body mass index). BMI is a measurement that tells if you are at a healthy weight. Waist circumference. This measures the distance around your waistline. This measurement also tells if you are at a healthy weight and may help predict your risk of certain diseases, such as type 2 diabetes and high blood pressure. Heart rate and blood pressure. Body temperature. Skin for abnormal spots. What immunizations do I need?  Vaccines are usually given at various ages, according to a schedule. Your health care provider will recommend vaccines for you based on your age, medical history, and lifestyle or other factors, such as travel or where you work. What tests do I need? Screening Your health care provider may recommend screening tests for certain conditions. This may include: Lipid and cholesterol levels. Diabetes screening. This is done by checking your blood sugar (glucose) after you have not eaten for a while (fasting). Hepatitis B test. Hepatitis C test. HIV (human immunodeficiency virus) test. STI (sexually transmitted infection)  testing, if you are at risk. Lung cancer screening. Prostate cancer screening. Colorectal cancer screening. Talk with your health care provider about your test results, treatment options, and if necessary, the need for more tests. Follow these instructions at home: Eating and drinking  Eat a diet that includes fresh fruits and vegetables, whole grains, lean protein, and low-fat dairy products. Take vitamin and mineral supplements as recommended by your health care provider. Do not drink alcohol if your health care provider tells you not to drink. If you drink alcohol: Limit how much you have to 0-2 drinks a day. Know how much alcohol is in your drink. In the [place] one drink equals one 12 oz bottle of beer (355 mL), one 5 oz glass of wine (148 mL), or one 1 oz glass of hard liquor (44 mL). Lifestyle Brush your teeth every morning and night with fluoride toothpaste. Floss one time each day. Exercise for at least 30 minutes 5 or more days each week. Do not use any products that contain nicotine or tobacco. These products include cigarettes, chewing tobacco, and vaping devices, such as e-cigarettes. If you need help quitting, ask your health care provider. Do not use drugs. If you are sexually active, practice safe sex. Use a condom or other form of protection to prevent STIs. Take aspirin only as told by your health care provider. Make sure that you understand how much to take and what form to take. Work with your health care provider to find out whether it is safe and beneficial for you to take aspirin daily. Find healthy ways to manage stress, such as: Meditation, yoga, or listening to music. Journaling. Talking to a trusted person. Spending time with friends and family. Minimize exposure to UV radiation to reduce  your risk of skin cancer. Safety Always wear your seat belt while driving or riding in a vehicle. Do not drive: If you have been drinking alcohol. Do not ride with someone who  has been drinking. When you are tired or distracted. While texting. If you have been using any mind-altering substances or drugs. Wear a helmet and other protective equipment during sports activities. If you have firearms in your house, make sure you follow all gun safety procedures. What's next? Go to your health care provider once a year for an annual wellness visit. Ask your health care provider how often you should have your eyes and teeth checked. Stay up to date on all vaccines. This information is not intended to replace advice given to you by your health care provider. Make sure you discuss any questions you have with your health care provider. Document Revised: 11/20/2020 Document Reviewed: 11/20/2020 Elsevier Patient Education  2024 [employer].

## 2024-05-18 ENCOUNTER — Encounter: Payer: Self-pay | Admitting: Family Medicine

## 2024-05-18 ENCOUNTER — Ambulatory Visit (INDEPENDENT_AMBULATORY_CARE_PROVIDER_SITE_OTHER): Admitting: Family Medicine

## 2024-05-18 VITALS — BP 106/70 | HR 72 | Resp 16 | Ht 74.0 in | Wt 207.5 lb

## 2024-05-18 DIAGNOSIS — R739 Hyperglycemia, unspecified: Secondary | ICD-10-CM

## 2024-05-18 DIAGNOSIS — Z0000 Encounter for general adult medical examination without abnormal findings: Secondary | ICD-10-CM

## 2024-05-18 DIAGNOSIS — F341 Dysthymic disorder: Secondary | ICD-10-CM

## 2024-05-18 DIAGNOSIS — F411 Generalized anxiety disorder: Secondary | ICD-10-CM | POA: Diagnosis not present

## 2024-05-18 DIAGNOSIS — I861 Scrotal varices: Secondary | ICD-10-CM

## 2024-05-18 DIAGNOSIS — Z0001 Encounter for general adult medical examination with abnormal findings: Secondary | ICD-10-CM | POA: Diagnosis not present

## 2024-05-18 DIAGNOSIS — Z87891 Personal history of nicotine dependence: Secondary | ICD-10-CM | POA: Diagnosis not present

## 2024-05-18 DIAGNOSIS — E785 Hyperlipidemia, unspecified: Secondary | ICD-10-CM

## 2024-05-18 DIAGNOSIS — Z79899 Other long term (current) drug therapy: Secondary | ICD-10-CM

## 2024-05-18 DIAGNOSIS — R7303 Prediabetes: Secondary | ICD-10-CM

## 2024-05-18 DIAGNOSIS — Z1159 Encounter for screening for other viral diseases: Secondary | ICD-10-CM

## 2024-05-18 DIAGNOSIS — N138 Other obstructive and reflux uropathy: Secondary | ICD-10-CM

## 2024-05-18 DIAGNOSIS — N401 Enlarged prostate with lower urinary tract symptoms: Secondary | ICD-10-CM

## 2024-05-18 DIAGNOSIS — D708 Other neutropenia: Secondary | ICD-10-CM | POA: Diagnosis not present

## 2024-05-18 NOTE — Progress Notes (Signed)
 Name: Gilbert Buchanan   MRN: [id number]    DOB: [birth year]   Date:05/18/2024       Progress Note  Subjective  Chief Complaint  Chief Complaint  Patient presents with   Annual Exam    HPI  Patient presents for annual CPE .  Discussed the use of AI scribe software for clinical note transcription with the patient, who gave verbal consent to proceed.  History of Present Illness Gilbert Buchanan is a 53 year old male with left varicocele and BPH who presents for follow-up on urological issues.  He has been experiencing issues related to a left varicocele and benign prostatic hyperplasia (BPH). Initially, he sought care at [facility] for these concerns, where he was scheduled to see two different doctors for each condition. However, he never saw anyone for the BPH issue and felt that the focus was diverted to bladder issues instead. He describes the experience at the clinic as chaotic and unsatisfactory, leading to a lack of resolution for his primary concerns.  He underwent a urodynamic evaluation and a three-hour bladder study, but he was not informed of the results unless something was found. He was prescribed Gemtesa for lower urinary tract symptoms but did not take it due to concurrent medication issues. He has not had a varicocelectomy despite being referred to another doctor for consideration of the procedure. He expresses frustration with the lack of progress and communication regarding his treatment plan.  He has a history of a left varicocele, which was previously treated with a varicocelectomy in 2008. He reports that the varicocele has 'blown up again.' He also mentions a past inguinal hernia repair, which has been completed.  He has a history of anxiety and depression, which are currently well-controlled with Pristiq . He reports significant improvement in symptoms, including the resolution of tremors associated with anxiety. He has not been using Buspar  recently.  He notes  a recent weight gain of five pounds, attributing it to poor dietary habits due to increased social events and work-related meals. He maintains a high level of physical activity through daily push-ups, sit-ups, and various physical projects, including construction and yard work.  He quit smoking in 2014 after a 25-year history of smoking a pack a day. He is considering lung cancer screening due to his smoking history.  He reports a recent upper respiratory infection following a flight from [place], which he attributes to being seated near the bathroom on the plane. He experienced a 'hack' for about a week but has since recovered.  No chest pain, palpitations, cough, shortness of breath, or abdominal pain. Reports recent upper respiratory infection with nasal drainage.       IPSS     Row Name 05/18/24 0934         International Prostate Symptom Score   How often have you had the sensation of not emptying your bladder? About half the time     How often have you had to urinate less than every two hours? Almost always     How often have you found you stopped and started again several times when you urinated? Not at All     How often have you found it difficult to postpone urination? Not at All     How often have you had a weak urinary stream? About half the time     How often have you had to strain to start urination? Not at All     How many times did you  typically get up at night to urinate? None     Total IPSS Score 11       Quality of Life due to urinary symptoms   If you were to spend the rest of your life with your urinary condition just the way it is now how would you feel about that? Unhappy        Diet: not eating healthy lately Exercise: continue regular physical activity  Last Dental Exam: up to date  Last Eye Exam: up to date   Depression: phq 9 is negative    05/18/2024    9:31 AM 03/16/2024    9:02 AM 02/08/2024    2:09 PM 01/24/2024    1:07 PM 11/10/2023    1:38 PM   Depression screen PHQ 2/9  Decreased Interest 0 0 3 2 0  Down, Depressed, Hopeless 0 0 3 2 0  PHQ - 2 Score 0 0 6 4 0  Altered sleeping  0 3 2   Tired, decreased energy  0 3 2   Change in appetite  0 3 0   Feeling bad or failure about yourself   0 0 0   Trouble concentrating  2 3 2    Moving slowly or fidgety/restless  0 0 0   Suicidal thoughts  0 0 0   PHQ-9 Score  2  18  10     Difficult doing work/chores  Not difficult at all Very difficult Very difficult      Data saved with a previous flowsheet row definition    Hypertension:  BP Readings from Last 3 Encounters:  05/18/24 106/70  03/16/24 122/78  02/08/24 126/78    Obesity: Wt Readings from Last 3 Encounters:  05/18/24 207 lb 8 oz (94.1 kg)  03/16/24 202 lb 6.4 oz (91.8 kg)  02/08/24 205 lb 9.6 oz (93.3 kg)   BMI Readings from Last 3 Encounters:  05/18/24 26.64 kg/m  03/16/24 25.99 kg/m  02/08/24 26.40 kg/m     Flowsheet Row Office Visit from 05/18/2024 in [facility]  AUDIT-C Score 1      Married STD testing and prevention (HIV/chl/gon/syphilis):  not applicable Sexual history: no problems  Hep C Screening: completed Skin cancer: Discussed monitoring for atypical lesions Colorectal cancer: repeat in 2030 Prostate cancer:  yes - seen by urologist  Lab Results  Component Value Date   PSA 1.06 05/18/2023   PSA 1.01 05/14/2022   PSA 1.47 05/13/2021     Lung cancer:  Low Dose CT Chest recommended if Age 67-80 years, 30 pack-year currently smoking OR have quit w/in 15years. Patient  is a candidate for screening   AAA: The USPSTF recommends one-time screening with ultrasonography in men ages 16 to 64 years who have ever smoked. Patient   is not a candidate for screening  ECG:  2014  Vaccines: reviewed with the patient.   Advanced Care Planning: A voluntary discussion about advance care planning including the explanation and discussion of advance directives.  Discussed health  care proxy and Living will, and the patient was able to identify a health care proxy as wife .  Patient does have a living will and power of attorney of health care   Patient Active Problem List   Diagnosis Date Noted   Bilateral recurrent inguinal hernia without obstruction or gangrene 12/05/2023   Other neutropenia 05/27/2022   Intermittent low back pain 05/27/2022   Insomnia 05/27/2022   COVID-19 01/31/2020   Noise-induced hearing loss of  both ears 06/28/2017   Baker's cyst of knee, right 12/25/2016   Dyslipidemia 11/11/2015   Hyperglycemia 11/11/2015   Chronic constipation 11/11/2015   GAD (generalized anxiety disorder) 11/11/2015   Prostatitis 05/05/2015   BPH with obstruction/lower urinary tract symptoms 05/05/2015    Past Surgical History:  Procedure Laterality Date   COLONOSCOPY WITH PROPOFOL  N/A 07/02/2021   Procedure: COLONOSCOPY WITH PROPOFOL ;  Surgeon: Therisa Bi, MD;  Location: [facility] ENDOSCOPY;  Service: Gastroenterology;  Laterality: N/A;   HERNIA REPAIR     INSERTION OF MESH Bilateral 11/29/2023   Procedure: INSERTION OF MESH;  Surgeon: Marinda Jayson KIDD, MD;  Location: [facility] ORS;  Service: General;  Laterality: Bilateral;   REPAIR, HERNIA, INGUINAL, BILATERAL, ROBOT-ASSISTED Bilateral 11/29/2023   Procedure: REPAIR, HERNIA, INGUINAL, BILATERAL, ROBOT-ASSISTED;  Surgeon: Marinda Jayson KIDD, MD;  Location: [facility] ORS;  Service: General;  Laterality: Bilateral;   VARICOCELE EXCISION      Family History  Problem Relation Age of Onset   Kidney disease Mother    Testicular cancer Father    Polycystic kidney disease Other    Heart disease Maternal Grandmother    Stroke Paternal Grandmother    Stroke Paternal Grandfather    Prostate cancer Neg Hx     Social History   Socioeconomic History   Marital status: Married    Spouse name: Angelique   Number of children: 2   Years of education: Not on file   Highest education level: Bachelor's degree (e.g., BA, AB, BS)   Occupational History   Occupation: solicitor   Tobacco Use   Smoking status: Former    Current packs/day: 0.00    Average packs/day: 1 pack/day for 25.0 years (25.0 ttl pk-yrs)    Types: Cigarettes, Cigars    Start date: 06/09/1987    Quit date: 06/08/2012    Years since quitting: 11.9    Passive exposure: Past   Smokeless tobacco: Former    Types: Snuff    Quit date: 05/07/2017   Tobacco comments:    Uses ZEN for nicotine  Vaping Use   Vaping status: Former   Start date: 06/28/2014   Quit date: 06/08/2016  Substance and Sexual Activity   Alcohol use: Yes    Comment: occasionally   Drug use: Yes    Types: Marijuana   Sexual activity: Yes    Partners: Female  Other Topics Concern   Not on file  Social History Narrative   Married, two children at home    He work in [place] now , solicitor    Social Drivers of Health   Tobacco Use: Medium Risk (05/18/2024)   Patient History    Smoking Tobacco Use: Former    Smokeless Tobacco Use: Former    Passive Exposure: Past  Physicist, Medical Strain: Low Risk (05/18/2024)   Overall Financial Resource Strain (CARDIA)    Difficulty of Paying Living Expenses: Not hard at all  Food Insecurity: No Food Insecurity (05/18/2024)   Epic    Worried About Programme Researcher, Broadcasting/film/video in the Last Year: Never true    Ran Out of Food in the Last Year: Never true  Transportation Needs: No Transportation Needs (05/18/2024)   Epic    Lack of Transportation (Medical): No    Lack of Transportation (Non-Medical): No  Physical Activity: Inactive (05/18/2024)   Exercise Vital Sign    Days of Exercise per Week: 0 days    Minutes of Exercise per Session: 0 min  Stress: Stress Concern Present (05/18/2024)  [employer] of Occupational Health - Occupational Stress Questionnaire    Feeling of Stress: To some extent  Social Connections: Patient Declined (05/18/2024)   Social Connection and Isolation Panel    Frequency of Communication with  Friends and Family: Patient declined    Frequency of Social Gatherings with Friends and Family: Patient declined    Attends Religious Services: Patient declined    Active Member of Clubs or Organizations: Patient declined    Attends Banker Meetings: Patient declined    Marital Status: Patient declined  Intimate Partner Violence: Not At Risk (05/18/2024)   Epic    Fear of Current or Ex-Partner: No    Emotionally Abused: No    Physically Abused: No    Sexually Abused: No  Depression (PHQ2-9): Low Risk (05/18/2024)   Depression (PHQ2-9)    PHQ-2 Score: 0  Alcohol Screen: Low Risk (05/18/2024)   Alcohol Screen    Last Alcohol Screening Score (AUDIT): 1  Housing: Unknown (05/18/2024)   Epic    Unable to Pay for Housing in the Last Year: No    Number of Times Moved in the Last Year: Not on file    Homeless in the Last Year: No  Utilities: Not At Risk (05/18/2024)   Epic    Threatened with loss of utilities: No  Health Literacy: Adequate Health Literacy (05/18/2024)   B1300 Health Literacy    Frequency of need for help with medical instructions: Never    Current Medications[1]  Allergies[2]   ROS  Constitutional: Negative for fever, positive for mild  weight change.  Respiratory: Negative for cough and shortness of breath.   Cardiovascular: Negative for chest pain or palpitations.  Gastrointestinal: Negative for abdominal pain, no bowel changes.  Musculoskeletal: Negative for gait problem or joint swelling.  Skin: Negative for rash.  Neurological: Negative for dizziness or headache.  No other specific complaints in a complete review of systems (except as listed in HPI above).    Objective  Vitals:   05/18/24 0936  BP: 106/70  Pulse: 72  Resp: 16  SpO2: 99%  Weight: 207 lb 8 oz (94.1 kg)  Height: 6' 2 (1.88 m)    Body mass index is 26.64 kg/m.  Physical Exam  Constitutional: Patient appears well-developed and well-nourished. No distress.  HENT:  Head: Normocephalic and atraumatic. Ears: B TMs ok, no erythema or effusion; Nose: Nose normal. Mouth/Throat: Oropharynx is clear and moist. No oropharyngeal exudate.  Eyes: Conjunctivae and EOM are normal. Pupils are equal, round, and reactive to light. No scleral icterus.  Neck: Normal range of motion. Neck supple. No JVD present. No thyromegaly present.  Cardiovascular: Normal rate, regular rhythm and normal heart sounds.  No murmur heard. No BLE edema. Pulmonary/Chest: Effort normal and breath sounds normal. No respiratory distress. Abdominal: Soft. Bowel sounds are normal, no distension. There is no tenderness. no masses MALE GENITALIA: seeing urologist  RECTAL:not done seeing urologist  Musculoskeletal: Normal range of motion, no joint effusions. No gross deformities Neurological: he is alert and oriented to person, place, and time. No cranial nerve deficit. Coordination, balance, strength, speech and gait are normal.  Skin: Skin is warm and dry. No rash noted. No erythema.  Psychiatric: Patient has a normal mood and affect. behavior is normal. Judgment and thought content normal.     Assessment & Plan  Well adult exam Blood pressure controlled. Weight gain due to diet and social activities. Physical activity high. Dental and eye exams current. Colonoscopy follow-up in  7 years. Lung cancer screening indicated. Hepatitis B vaccination status to be confirmed. EKG due. - Encouraged healthier eating habits. - Scheduled EKG for next visit. - Referred for lung cancer screening. - Checked hepatitis B vaccination status. - Scheduled blood work for next visit. Benign prostatic hyperplasia with lower urinary tract symptoms BPH with unresolved lower urinary tract symptoms. Previous urological care unsatisfactory. Urodynamic results unclear. Seeking second opinion. - Referred to different urologist for second opinion. - Consider trial of Gemtesa if appropriate.  Left varicocele Grade 3 left  varicocele with symptoms suggesting recurrence. Previous varicocelectomy in 2008. Seeking second opinion. - Referred to different urologist for second opinion and potential varicocelectomy.  Generalized anxiety disorder and dysthymia Anxiety and depression well-controlled with current medication. Tremor resolved. Improved focus and motivation reported. - Continue current medication regimen keep follow up in Feb     -Prostate cancer screening and PSA options (with potential risks and benefits of testing vs not testing) were discussed along with recent recs/guidelines. -USPSTF grade A and B recommendations reviewed with patient; age-appropriate recommendations, preventive care, screening tests, etc discussed and encouraged; healthy living encouraged; see AVS for patient education given to patient -Discussed importance of 150 minutes of physical activity weekly, eat two servings of fish weekly, eat one serving of tree nuts ( cashews, pistachios, pecans, almonds.SABRA) every other day, eat 6 servings of fruit/vegetables daily and drink plenty of water and avoid sweet beverages.  -Reviewed Health Maintenance: yes     [1]  Current Outpatient Medications:    atenolol  (TENORMIN ) 25 MG tablet, Take 0.5-1 tablets (12.5-25 mg total) by mouth at bedtime as needed., Disp: 30 tablet, Rfl: 0   busPIRone  (BUSPAR ) 5 MG tablet, Take 1 tablet (5 mg total) by mouth 2 (two) times daily., Disp: 180 tablet, Rfl: 0   desvenlafaxine  (PRISTIQ ) 50 MG 24 hr tablet, TAKE 1 TABLET(50 MG) BY MOUTH DAILY, Disp: 90 tablet, Rfl: 0   fluticasone  (FLONASE ) 50 MCG/ACT nasal spray, Place 2 sprays into both nostrils daily. (Patient taking differently: Place 2 sprays into both nostrils daily as needed for allergies.), Disp: 16 mL, Rfl: 0   rosuvastatin  (CRESTOR ) 10 MG tablet, Take 1 tablet (10 mg total) by mouth daily. In place of pravastatin , Disp: 90 tablet, Rfl: 3   tamsulosin  (FLOMAX ) 0.4 MG CAPS capsule, Take 1 capsule (0.4 mg  total) by mouth daily., Disp: 90 capsule, Rfl: 3   traZODone  (DESYREL ) 50 MG tablet, Take 1 tablet (50 mg total) by mouth at bedtime., Disp: 90 tablet, Rfl: 3 [2]  Allergies Allergen Reactions   Acyclovir And Related     Forgets reaction

## 2024-05-30 ENCOUNTER — Ambulatory Visit
Admission: RE | Admit: 2024-05-30 | Discharge: 2024-05-30 | Disposition: A | Discharge status: Home / Self Care | Source: Ambulatory Visit | Attending: Family Medicine | Admitting: Family Medicine

## 2024-05-30 DIAGNOSIS — Z87891 Personal history of nicotine dependence: Secondary | ICD-10-CM | POA: Diagnosis not present

## 2024-06-09 ENCOUNTER — Ambulatory Visit: Payer: Self-pay | Admitting: Family Medicine

## 2024-06-13 ENCOUNTER — Ambulatory Visit

## 2024-06-14 ENCOUNTER — Other Ambulatory Visit: Payer: Self-pay | Admitting: Family Medicine

## 2024-06-14 DIAGNOSIS — I2510 Atherosclerotic heart disease of native coronary artery without angina pectoris: Secondary | ICD-10-CM

## 2024-06-18 DIAGNOSIS — I2510 Atherosclerotic heart disease of native coronary artery without angina pectoris: Secondary | ICD-10-CM | POA: Insufficient documentation

## 2024-06-18 NOTE — Progress Notes (Unsigned)
 Cardiology Office Note  Date:  06/20/2024   ID:  Gilbert Buchanan, DOB 11/15/1970, MRN [id number]  PCP:  Gilbert Mire, MD   Chief Complaint  Patient presents with   New Patient (Initial Visit)    Ref by Dr. Glenard for recent Chest CT lung screening showing age advanced three-vessel coronary artery calcification, including the left main coronary artery.  Patient denies chest pain or shortness of breath.      HPI:  Gilbert Buchanan is a 54 y.o. male with past medical history of: Past Medical History:  Diagnosis Date   Anxiety    Baker's cyst of knee, right 12/25/2016   BPH with obstruction/lower urinary tract symptoms 05/05/2015   Chronic constipation 11/11/2015   Elevated blood pressure (not hypertension)    History of Lyme disease    Hyperglycemia 11/11/2015   Hyperlipidemia    Incomplete bladder emptying    Insomnia 05/27/2022   Noise-induced hearing loss of both ears 06/28/2017   Over weight    Prostatitis    Stones, prostate   25 years smoking Who presents by referral from Dr. Glenard for consultation of his coronary artery disease  Recent imaging reviewed CT chest 05/30/24, images pulled up and reviewed on today's visit Report details emphysema, this appears mild  three-vessel coronary calcification, also appears mild  Asymptomatic, denies chest pain or shortness of breath concerning for angina  Lab work reviewed Total cholesterol 194, LDL 126 in 12/24 on pravastatin  Now On crestor  10 Repeat lab work done this morning  EKG personally reviewed by myself on todays visit  He reports family history of cardiac disease Gransfather died 28, CAD Father with CAD  EKG personally reviewed by myself on todays visit EKG Interpretation Date/Time:  Tuesday June 20 2024 10:52:37 EST Ventricular Rate:  59 PR Interval:  134 QRS Duration:  86 QT Interval:  394 QTC Calculation: 390 R Axis:   67  Text Interpretation: Sinus bradycardia When compared with ECG of  24-Jan-2013 13:39, Vent. rate has decreased BY  38 BPM QT has shortened Confirmed by Emmalyn Hinson [phone]) on 06/20/2024 11:06:40 AM    PMH:   has a past medical history of Anxiety, Baker's cyst of knee, right (12/25/2016), BPH with obstruction/lower urinary tract symptoms (05/05/2015), Chronic constipation (11/11/2015), Elevated blood pressure (not hypertension), History of Lyme disease, Hyperglycemia (11/11/2015), Hyperlipidemia, Incomplete bladder emptying, Insomnia (05/27/2022), Noise-induced hearing loss of both ears (06/28/2017), Over weight, Prostatitis, and Stones, prostate.   PSH:    Past Surgical History:  Procedure Laterality Date   COLONOSCOPY WITH PROPOFOL  N/A 07/02/2021   Procedure: COLONOSCOPY WITH PROPOFOL ;  Surgeon: Therisa Bi, MD;  Location[facility] ENDOSCOPY;  Service: Gastroenterology;  Laterality: N/A;   HERNIA REPAIR     INSERTION OF MESH Bilateral 11/29/2023   Procedure: INSERTION OF MESH;  Surgeon: Marinda Jayson MALVA, MD;  Location: [facility] ORS;  Service: General;  Laterality: Bilateral;   REPAIR, HERNIA, INGUINAL, BILATERAL, ROBOT-ASSISTED Bilateral 11/29/2023   Procedure: REPAIR, HERNIA, INGUINAL, BILATERAL, ROBOT-ASSISTED;  Surgeon: Marinda Jayson MALVA, MD;  Location: [facility] ORS;  Service: General;  Laterality: Bilateral;   VARICOCELE EXCISION      Current Outpatient Medications  Medication Sig Dispense Refill   desvenlafaxine  (PRISTIQ ) 50 MG 24 hr tablet TAKE 1 TABLET(50 MG) BY MOUTH DAILY 90 tablet 0   fluticasone  (FLONASE ) 50 MCG/ACT nasal spray Place 2 sprays into both nostrils daily. 16 mL 0   rosuvastatin  (CRESTOR ) 10 MG tablet Take 1 tablet (10 mg total) by mouth daily.  In place of pravastatin  90 tablet 3   tamsulosin  (FLOMAX ) 0.4 MG CAPS capsule Take 1 capsule (0.4 mg total) by mouth daily. 90 capsule 3   traZODone  (DESYREL ) 50 MG tablet Take 1 tablet (50 mg total) by mouth at bedtime. 90 tablet 3   busPIRone  (BUSPAR ) 5 MG tablet Take 1 tablet (5 mg total) by mouth 2  (two) times daily. (Patient not taking: Reported on 06/20/2024) 180 tablet 0   No current facility-administered medications for this visit.   Allergies:   Acyclovir and related   Social History:  The patient  reports that he quit smoking about 12 years ago. His smoking use included cigarettes and cigars. He started smoking about 37 years ago. He has a 25 pack-year smoking history. He has been exposed to tobacco smoke. He quit smokeless tobacco use about 7 years ago.  His smokeless tobacco use included snuff. He reports current alcohol use. He reports current drug use. Drug: Marijuana.   Family History:   family history includes Heart disease in his maternal grandmother; Polycystic kidney disease in his mother and another family member; Stroke in his paternal grandfather and paternal grandmother; Testicular cancer in his father.    Review of Systems: Review of Systems  Constitutional: Negative.   HENT: Negative.    Respiratory: Negative.    Cardiovascular: Negative.   Gastrointestinal: Negative.   Musculoskeletal: Negative.   Neurological: Negative.   Psychiatric/Behavioral: Negative.    All other systems reviewed and are negative.  PHYSICAL EXAM: VS:  BP 100/70 (BP Location: Right Arm, Patient Position: Sitting, Cuff Size: Normal)   Pulse (!) 59   Ht 6' 2 (1.88 m)   Wt 204 lb 6 oz (92.7 kg)   SpO2 98%   BMI 26.24 kg/m  , BMI Body mass index is 26.24 kg/m. GEN: Well nourished, well developed, in no acute distress HEENT: normal Neck: no JVD, carotid bruits, or masses Cardiac: RRR; no murmurs, rubs, or gallops,no edema  Respiratory:  clear to auscultation bilaterally, normal work of breathing GI: soft, nontender, nondistended, + BS MS: no deformity or atrophy Skin: warm and dry, no rash Neuro:  Strength and sensation are intact Psych: euthymic mood, full affect  Recent Labs: 01/24/2024: ALT 39; BUN 13; Creat 0.91; Hemoglobin 15.6; Platelets 190; Potassium 4.2; Sodium 142; TSH  1.12    Lipid Panel Lab Results  Component Value Date   CHOL 194 05/18/2023   HDL 47 05/18/2023   LDLCALC 126 (H) 05/18/2023   TRIG 103 05/18/2023      Wt Readings from Last 3 Encounters:  06/20/24 204 lb 6 oz (92.7 kg)  05/18/24 207 lb 8 oz (94.1 kg)  03/16/24 202 lb 6.4 oz (91.8 kg)       ASSESSMENT AND PLAN:  Problem List Items Addressed This Visit       Cardiology Problems   Hyperlipidemia   Coronary artery calcification   Relevant Orders   EKG 12-Lead (Completed)     Other   Hyperglycemia   Coronary calcification Mild calcification noted on CT scan chest Prior smoking history 25 years Prediabetes Asymptomatic, denies anginal symptoms Previously on pravastatin , changed to Crestor  10 mg year ago Repeat lipid panel pending No further cardiac testing needed at this time  Hyperlipidemia Recommended goal total cholesterol less than 150, LDL less than 70 He has lipids pending from this morning If cholesterol continues to run high, he may benefit from higher dose rosuvastatin , possibly adding Zetia 10 mg daily to his regiment  Hyperglycemia Lab work detailing mildly elevated glucose levels over the past several years We have encouraged continued exercise, careful diet management  Seen in consultation for Dr. Glenard and will be referred back to her office for ongoing care of the issues detailed above  Signed, Velinda Lunger, M.D., Ph.D. [facility], [facility] [place], [place] [phone]

## 2024-06-20 ENCOUNTER — Encounter: Payer: Self-pay | Admitting: Cardiovascular Disease

## 2024-06-20 ENCOUNTER — Ambulatory Visit: Attending: Cardiovascular Disease | Admitting: Cardiovascular Disease

## 2024-06-20 DIAGNOSIS — E782 Mixed hyperlipidemia: Secondary | ICD-10-CM

## 2024-06-20 DIAGNOSIS — R739 Hyperglycemia, unspecified: Secondary | ICD-10-CM

## 2024-06-20 DIAGNOSIS — I2510 Atherosclerotic heart disease of native coronary artery without angina pectoris: Secondary | ICD-10-CM

## 2024-06-20 NOTE — Patient Instructions (Signed)

## 2024-06-21 LAB — CBC WITH DIFFERENTIAL/PLATELET
Absolute Lymphocytes: 1201 {cells}/uL (ref 850–3900)
Absolute Monocytes: 250 {cells}/uL (ref 200–950)
Basophils Absolute: 20 {cells}/uL (ref 0–200)
Basophils Relative: 0.5 %
Eosinophils Absolute: 51 {cells}/uL (ref 15–500)
Eosinophils Relative: 1.3 %
HCT: 45.8 % (ref 39.4–51.1)
Hemoglobin: 15.4 g/dL (ref 13.2–17.1)
MCH: 31.8 pg (ref 27.0–33.0)
MCHC: 33.6 g/dL (ref 31.6–35.4)
MCV: 94.6 fL (ref 81.4–101.7)
MPV: 11.2 fL (ref 7.5–12.5)
Monocytes Relative: 6.4 %
Neutro Abs: 2379 {cells}/uL (ref 1500–7800)
Neutrophils Relative %: 61 %
Platelets: 181 Thousand/uL (ref 140–400)
RBC: 4.84 Million/uL (ref 4.20–5.80)
RDW: 11.8 % (ref 11.0–15.0)
Total Lymphocyte: 30.8 %
WBC: 3.9 Thousand/uL (ref 3.8–10.8)

## 2024-06-21 LAB — HEPATITIS B SURFACE ANTIBODY,QUALITATIVE: Hep B S Ab: NONREACTIVE

## 2024-06-21 LAB — COMPREHENSIVE METABOLIC PANEL WITH GFR
AG Ratio: 2 (calc) (ref 1.0–2.5)
ALT: 40 U/L (ref 9–46)
AST: 31 U/L (ref 10–35)
Albumin: 4.4 g/dL (ref 3.6–5.1)
Alkaline phosphatase (APISO): 40 U/L (ref 35–144)
BUN: 16 mg/dL (ref 7–25)
CO2: 29 mmol/L (ref 20–32)
Calcium: 9.4 mg/dL (ref 8.6–10.3)
Chloride: 102 mmol/L (ref 98–110)
Creat: 0.86 mg/dL (ref 0.70–1.30)
Globulin: 2.2 g/dL (ref 1.9–3.7)
Glucose, Bld: 110 mg/dL — ABNORMAL HIGH (ref 65–99)
Potassium: 4.4 mmol/L (ref 3.5–5.3)
Sodium: 138 mmol/L (ref 135–146)
Total Bilirubin: 0.9 mg/dL (ref 0.2–1.2)
Total Protein: 6.6 g/dL (ref 6.1–8.1)
eGFR: 104 mL/min/1.73m2

## 2024-06-21 LAB — HEMOGLOBIN A1C
Hgb A1c MFr Bld: 5.2 %
Mean Plasma Glucose: 103 mg/dL
eAG (mmol/L): 5.7 mmol/L

## 2024-06-21 LAB — LIPID PANEL
Cholesterol: 158 mg/dL
HDL: 52 mg/dL
LDL Cholesterol (Calc): 90 mg/dL
Non-HDL Cholesterol (Calc): 106 mg/dL
Total CHOL/HDL Ratio: 3 (calc)
Triglycerides: 71 mg/dL

## 2024-06-21 LAB — PSA: PSA: 1.23 ng/mL

## 2024-07-06 ENCOUNTER — Other Ambulatory Visit: Payer: Self-pay | Admitting: Family Medicine

## 2024-07-06 DIAGNOSIS — F411 Generalized anxiety disorder: Secondary | ICD-10-CM

## 2024-07-06 DIAGNOSIS — F341 Dysthymic disorder: Secondary | ICD-10-CM

## 2024-07-06 NOTE — Telephone Encounter (Signed)
 Requested Prescriptions  Pending Prescriptions Disp Refills   desvenlafaxine  (PRISTIQ ) 50 MG 24 hr tablet [Pharmacy Med Name: DESVENLAFAXINE  ER SUCCINATE 50MG  T] 90 tablet 1    Sig: TAKE 1 TABLET(50 MG) BY MOUTH DAILY     Psychiatry: Antidepressants - SNRI - desvenlafaxine  & venlafaxine Failed - 07/06/2024  5:43 PM      Failed - Lipid Panel in normal range within the last 12 months    Cholesterol, Total  Date Value Ref Range Status  11/15/2015 218 (H) 100 - 199 mg/dL Final   Cholesterol  Date Value Ref Range Status  06/20/2024 158 <200 mg/dL Final   LDL Cholesterol (Calc)  Date Value Ref Range Status  06/20/2024 90 mg/dL (calc) Final    Comment:    Reference range: <100 . Desirable range <100 mg/dL for primary prevention;   <70 mg/dL for patients with CHD or diabetic patients  with > or = 2 CHD risk factors. SABRA LDL-C is now calculated using the Martin-Hopkins  calculation, which is a validated novel method providing  better accuracy than the Friedewald equation in the  estimation of LDL-C.  Gladis APPLETHWAITE et al. SANDREA. 7986;689(80): 2061-2068  (http://education.[employer].com/faq/FAQ164)    HDL  Date Value Ref Range Status  06/20/2024 52 > OR = 40 mg/dL Final  93/90/7982 40 >60 mg/dL Final   Triglycerides  Date Value Ref Range Status  06/20/2024 71 <150 mg/dL Final         Passed - Cr in normal range and within 360 days    Creat  Date Value Ref Range Status  06/20/2024 0.86 0.70 - 1.30 mg/dL Final         Passed - Last BP in normal range    BP Readings from Last 1 Encounters:  06/20/24 100/70         Passed - Valid encounter within last 6 months    Recent Outpatient Visits           1 month ago Well adult exam   [facility] Glenard Mire, MD   3 months ago GAD (generalized anxiety disorder)   [facility] Glenard Mire, MD   4 months ago GAD (generalized anxiety disorder)   [facility] Glenard Mire, MD   5 months ago GAD (generalized anxiety disorder)   [facility] Glenard Mire, MD   7 months ago Bilateral recurrent inguinal hernia without obstruction or gangrene   [facility] Sowles, Krichna, MD

## 2024-07-12 ENCOUNTER — Ambulatory Visit: Payer: Self-pay

## 2024-07-12 DIAGNOSIS — F411 Generalized anxiety disorder: Secondary | ICD-10-CM

## 2024-07-12 DIAGNOSIS — F341 Dysthymic disorder: Secondary | ICD-10-CM

## 2024-07-12 MED ORDER — DESVENLAFAXINE SUCCINATE ER 50 MG PO TB24: 50.0000 mg | ORAL_TABLET | Freq: Every day | ORAL | 1 refills | Status: AC

## 2024-07-12 NOTE — Telephone Encounter (Signed)
 FYI Only or Action Required?: Action required by provider: update on patient condition.  Patient was last seen in primary care on 05/18/2024 by Glenard Mire, MD.  Called Nurse Triage reporting Medication Problem.  Triage Disposition: Call PCP Now  Patient/caregiver understands and will follow disposition?: Yes     Copied from CRM #[id number]. Topic: Clinical - Prescription Issue >> Jul 12, 2024  9:52 AM Macario HERO wrote: Reason for CRM: Patient called regarding refill status for: desvenlafaxine  (PRISTIQ ) 50 MG 24 hr tablet [[id number]] - Advised patient was sent to pharmacy 1/29. Stated they told him yesterday we never sent t over. - Patient stated he is completely out of his medication and for us  to resend it today.   Reason for Disposition  [1] Prescription not at pharmacy AND [2] was prescribed by doctor (or NP/PA) recently  (Exception: [facility] has access to EMR and prescription is recorded there. Go to Home Care and confirm for pharmacy.)  Answer Assessment - Initial Assessment Questions 1. NAME of MEDICINE: What medicine(s) are you calling about?    Contacted [facility] and spoke to [place], associate professor, she stated rx was there but not process. Requested rx begin to be processed for pt to pick up today and she stated rx will be ready in an hour and a half. Returned call to pt to notify him of information above, he voiced appreciation. NO further concerns at this time.  Protocols used: Medication Question Call-A-AH

## 2024-07-12 NOTE — Telephone Encounter (Signed)
 resent

## 2024-07-17 ENCOUNTER — Ambulatory Visit: Admitting: Family Medicine
